# Patient Record
Sex: Female | Born: 1985 | Race: Black or African American | Hispanic: No | Marital: Single | Smoking: Current every day smoker
Health system: Southern US, Community
[De-identification: ages and names within clinical notes are randomized; demographics above are authoritative.]

## PROBLEM LIST (undated history)

## (undated) DIAGNOSIS — F419 Anxiety disorder, unspecified: Secondary | ICD-10-CM

## (undated) DIAGNOSIS — R55 Syncope and collapse: Secondary | ICD-10-CM

## (undated) HISTORY — DX: Syncope and collapse: R55

## (undated) HISTORY — DX: Anxiety disorder, unspecified: F41.9

---
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
## 2005-01-28 ENCOUNTER — Emergency Department (HOSPITAL_COMMUNITY): Admission: EM | Admit: 2005-01-28 | Discharge: 2005-01-28 | Payer: Self-pay | Admitting: Emergency Medicine

## 2005-05-08 ENCOUNTER — Emergency Department (HOSPITAL_COMMUNITY): Admission: EM | Admit: 2005-05-08 | Discharge: 2005-05-09 | Payer: Self-pay | Admitting: Emergency Medicine

## 2005-06-28 ENCOUNTER — Emergency Department (HOSPITAL_COMMUNITY): Admission: EM | Admit: 2005-06-28 | Discharge: 2005-06-28 | Payer: Self-pay | Admitting: Emergency Medicine

## 2005-08-24 ENCOUNTER — Emergency Department (HOSPITAL_COMMUNITY): Admission: EM | Admit: 2005-08-24 | Discharge: 2005-08-25 | Payer: Self-pay | Admitting: Emergency Medicine

## 2005-10-13 ENCOUNTER — Emergency Department (HOSPITAL_COMMUNITY): Admission: EM | Admit: 2005-10-13 | Discharge: 2005-10-13 | Payer: Self-pay | Admitting: Family Medicine

## 2005-11-13 ENCOUNTER — Emergency Department (HOSPITAL_COMMUNITY): Admission: EM | Admit: 2005-11-13 | Discharge: 2005-11-13 | Payer: Self-pay | Admitting: Family Medicine

## 2005-12-20 ENCOUNTER — Emergency Department (HOSPITAL_COMMUNITY): Admission: EM | Admit: 2005-12-20 | Discharge: 2005-12-20 | Payer: Self-pay | Admitting: Emergency Medicine

## 2006-03-28 ENCOUNTER — Emergency Department (HOSPITAL_COMMUNITY): Admission: EM | Admit: 2006-03-28 | Discharge: 2006-03-28 | Payer: Self-pay | Admitting: Family Medicine

## 2006-12-08 ENCOUNTER — Emergency Department (HOSPITAL_COMMUNITY): Admission: EM | Admit: 2006-12-08 | Discharge: 2006-12-08 | Payer: Self-pay | Admitting: Emergency Medicine

## 2007-05-21 ENCOUNTER — Emergency Department (HOSPITAL_COMMUNITY): Admission: EM | Admit: 2007-05-21 | Discharge: 2007-05-21 | Payer: Self-pay | Admitting: Emergency Medicine

## 2007-07-01 ENCOUNTER — Emergency Department (HOSPITAL_COMMUNITY): Admission: EM | Admit: 2007-07-01 | Discharge: 2007-07-01 | Payer: Self-pay | Admitting: Family Medicine

## 2007-07-10 ENCOUNTER — Ambulatory Visit (HOSPITAL_COMMUNITY): Admission: RE | Admit: 2007-07-10 | Discharge: 2007-07-10 | Payer: Self-pay | Admitting: Obstetrics

## 2007-08-15 ENCOUNTER — Emergency Department (HOSPITAL_COMMUNITY): Admission: EM | Admit: 2007-08-15 | Discharge: 2007-08-16 | Payer: Self-pay | Admitting: Emergency Medicine

## 2007-09-03 ENCOUNTER — Ambulatory Visit (HOSPITAL_COMMUNITY): Admission: RE | Admit: 2007-09-03 | Discharge: 2007-09-03 | Payer: Self-pay | Admitting: Obstetrics

## 2007-11-03 ENCOUNTER — Inpatient Hospital Stay (HOSPITAL_COMMUNITY): Admission: AD | Admit: 2007-11-03 | Discharge: 2007-11-03 | Payer: Self-pay | Admitting: Obstetrics & Gynecology

## 2007-11-07 ENCOUNTER — Ambulatory Visit (HOSPITAL_COMMUNITY): Admission: RE | Admit: 2007-11-07 | Discharge: 2007-11-07 | Payer: Self-pay | Admitting: Obstetrics

## 2007-11-19 ENCOUNTER — Ambulatory Visit (HOSPITAL_COMMUNITY): Admission: RE | Admit: 2007-11-19 | Discharge: 2007-11-19 | Payer: Self-pay | Admitting: Obstetrics

## 2007-12-16 ENCOUNTER — Ambulatory Visit (HOSPITAL_COMMUNITY): Admission: RE | Admit: 2007-12-16 | Discharge: 2007-12-16 | Payer: Self-pay | Admitting: Obstetrics

## 2008-01-06 ENCOUNTER — Inpatient Hospital Stay (HOSPITAL_COMMUNITY): Admission: AD | Admit: 2008-01-06 | Discharge: 2008-01-06 | Payer: Self-pay | Admitting: Obstetrics

## 2008-01-14 ENCOUNTER — Inpatient Hospital Stay (HOSPITAL_COMMUNITY): Admission: AD | Admit: 2008-01-14 | Discharge: 2008-01-14 | Payer: Self-pay | Admitting: Obstetrics

## 2008-01-19 ENCOUNTER — Inpatient Hospital Stay (HOSPITAL_COMMUNITY): Admission: AD | Admit: 2008-01-19 | Discharge: 2008-01-19 | Payer: Self-pay | Admitting: Obstetrics

## 2008-01-22 ENCOUNTER — Inpatient Hospital Stay (HOSPITAL_COMMUNITY): Admission: AD | Admit: 2008-01-22 | Discharge: 2008-01-22 | Payer: Self-pay | Admitting: Obstetrics

## 2008-02-02 ENCOUNTER — Inpatient Hospital Stay (HOSPITAL_COMMUNITY): Admission: AD | Admit: 2008-02-02 | Discharge: 2008-02-02 | Payer: Self-pay | Admitting: Obstetrics

## 2008-02-02 ENCOUNTER — Inpatient Hospital Stay (HOSPITAL_COMMUNITY): Admission: AD | Admit: 2008-02-02 | Discharge: 2008-02-05 | Payer: Self-pay | Admitting: Obstetrics

## 2008-08-02 ENCOUNTER — Emergency Department (HOSPITAL_COMMUNITY): Admission: EM | Admit: 2008-08-02 | Discharge: 2008-08-02 | Payer: Self-pay | Admitting: Family Medicine

## 2010-01-01 ENCOUNTER — Emergency Department (HOSPITAL_COMMUNITY)
Admission: EM | Admit: 2010-01-01 | Discharge: 2010-01-01 | Payer: Self-pay | Source: Home / Self Care | Admitting: Family Medicine

## 2010-03-29 ENCOUNTER — Inpatient Hospital Stay (INDEPENDENT_AMBULATORY_CARE_PROVIDER_SITE_OTHER)
Admission: RE | Admit: 2010-03-29 | Discharge: 2010-03-29 | Disposition: A | Discharge status: Home / Self Care | Payer: Self-pay | Source: Ambulatory Visit | Attending: Family Medicine | Admitting: Family Medicine

## 2010-03-29 DIAGNOSIS — Z331 Pregnant state, incidental: Secondary | ICD-10-CM

## 2010-04-01 ENCOUNTER — Inpatient Hospital Stay (HOSPITAL_COMMUNITY)
Admission: AD | Admit: 2010-04-01 | Discharge: 2010-04-02 | Disposition: A | Discharge status: Home / Self Care | Payer: Self-pay | Source: Ambulatory Visit | Attending: Obstetrics & Gynecology | Admitting: Obstetrics & Gynecology

## 2010-04-01 DIAGNOSIS — O2390 Unspecified genitourinary tract infection in pregnancy, unspecified trimester: Secondary | ICD-10-CM | POA: Insufficient documentation

## 2010-04-01 DIAGNOSIS — A499 Bacterial infection, unspecified: Secondary | ICD-10-CM | POA: Insufficient documentation

## 2010-04-01 DIAGNOSIS — B9689 Other specified bacterial agents as the cause of diseases classified elsewhere: Secondary | ICD-10-CM | POA: Insufficient documentation

## 2010-04-01 DIAGNOSIS — A5901 Trichomonal vulvovaginitis: Secondary | ICD-10-CM | POA: Insufficient documentation

## 2010-04-01 DIAGNOSIS — O98819 Other maternal infectious and parasitic diseases complicating pregnancy, unspecified trimester: Secondary | ICD-10-CM | POA: Insufficient documentation

## 2010-04-01 DIAGNOSIS — O209 Hemorrhage in early pregnancy, unspecified: Secondary | ICD-10-CM | POA: Insufficient documentation

## 2010-04-01 DIAGNOSIS — N760 Acute vaginitis: Secondary | ICD-10-CM | POA: Insufficient documentation

## 2010-04-01 DIAGNOSIS — N390 Urinary tract infection, site not specified: Secondary | ICD-10-CM | POA: Insufficient documentation

## 2010-04-01 LAB — URINALYSIS, ROUTINE W REFLEX MICROSCOPIC
Protein, ur: NEGATIVE mg/dL
Urobilinogen, UA: 0.2 mg/dL (ref 0.0–1.0)

## 2010-04-01 LAB — CBC
HCT: 35.8 % — ABNORMAL LOW (ref 36.0–46.0)
Hemoglobin: 11.6 g/dL — ABNORMAL LOW (ref 12.0–15.0)
MCHC: 32.4 g/dL (ref 30.0–36.0)
RBC: 3.8 MIL/uL — ABNORMAL LOW (ref 3.87–5.11)

## 2010-04-01 LAB — WET PREP, GENITAL

## 2010-04-01 LAB — ABO/RH: ABO/RH(D): O POS

## 2010-04-01 LAB — URINE MICROSCOPIC-ADD ON

## 2010-04-02 ENCOUNTER — Inpatient Hospital Stay (HOSPITAL_COMMUNITY): Payer: Self-pay

## 2010-04-02 LAB — HCG, QUANTITATIVE, PREGNANCY: hCG, Beta Chain, Quant, S: 299 m[IU]/mL — ABNORMAL HIGH (ref <5)

## 2010-04-05 ENCOUNTER — Encounter (HOSPITAL_COMMUNITY): Payer: Self-pay | Admitting: Radiology

## 2010-04-05 ENCOUNTER — Inpatient Hospital Stay (HOSPITAL_COMMUNITY)
Admission: AD | Admit: 2010-04-05 | Discharge: 2010-04-06 | Disposition: A | Discharge status: Home / Self Care | Payer: Self-pay | Source: Ambulatory Visit | Attending: Obstetrics and Gynecology | Admitting: Obstetrics and Gynecology

## 2010-04-05 ENCOUNTER — Inpatient Hospital Stay (HOSPITAL_COMMUNITY): Payer: Self-pay

## 2010-04-05 DIAGNOSIS — O209 Hemorrhage in early pregnancy, unspecified: Secondary | ICD-10-CM

## 2010-04-05 DIAGNOSIS — R52 Pain, unspecified: Secondary | ICD-10-CM

## 2010-04-05 LAB — AST: AST: 14 U/L (ref 0–37)

## 2010-04-05 LAB — CBC
Platelets: 336 10*3/uL (ref 150–400)
RBC: 3.94 MIL/uL (ref 3.87–5.11)
WBC: 8 10*3/uL (ref 4.0–10.5)

## 2010-04-05 LAB — DIFFERENTIAL
Basophils Absolute: 0 10*3/uL (ref 0.0–0.1)
Basophils Relative: 0 % (ref 0–1)
Eosinophils Absolute: 0.2 10*3/uL (ref 0.0–0.7)
Neutrophils Relative %: 54 % (ref 43–77)

## 2010-04-05 LAB — HCG, QUANTITATIVE, PREGNANCY: hCG, Beta Chain, Quant, S: 469 m[IU]/mL — ABNORMAL HIGH (ref <5)

## 2010-04-05 LAB — GC/CHLAMYDIA PROBE AMP, GENITAL
Chlamydia, DNA Probe: POSITIVE — AB
GC Probe Amp, Genital: NEGATIVE

## 2010-04-05 LAB — CREATININE, SERUM
Creatinine, Ser: 0.62 mg/dL (ref 0.4–1.2)
GFR calc Af Amer: >60 mL/min (ref >60)

## 2010-04-06 ENCOUNTER — Inpatient Hospital Stay (HOSPITAL_COMMUNITY): Admission: AD | Admit: 2010-04-06 | Payer: Self-pay | Source: Ambulatory Visit | Admitting: Obstetrics and Gynecology

## 2010-04-12 ENCOUNTER — Inpatient Hospital Stay (HOSPITAL_COMMUNITY)
Admission: AD | Admit: 2010-04-12 | Discharge: 2010-04-13 | Disposition: A | Discharge status: Home / Self Care | Payer: Self-pay | Source: Ambulatory Visit | Attending: Obstetrics & Gynecology | Admitting: Obstetrics & Gynecology

## 2010-04-12 DIAGNOSIS — O00109 Unspecified tubal pregnancy without intrauterine pregnancy: Secondary | ICD-10-CM | POA: Insufficient documentation

## 2010-04-12 LAB — DIFFERENTIAL
Eosinophils Absolute: 0.2 10*3/uL (ref 0.0–0.7)
Lymphs Abs: 2.3 10*3/uL (ref 0.7–4.0)
Monocytes Relative: 6 % (ref 3–12)
Neutrophils Relative %: 46 % (ref 43–77)

## 2010-04-12 LAB — CBC
MCH: 30.6 pg (ref 26.0–34.0)
MCV: 95.7 fL (ref 78.0–100.0)
Platelets: 381 10*3/uL (ref 150–400)
RBC: 3.76 MIL/uL — ABNORMAL LOW (ref 3.87–5.11)

## 2010-04-12 LAB — CREATININE, SERUM
Creatinine, Ser: 0.71 mg/dL (ref 0.4–1.2)
GFR calc non Af Amer: >60 mL/min (ref >60)

## 2010-05-29 LAB — POCT PREGNANCY, URINE: Preg Test, Ur: NEGATIVE

## 2010-05-29 LAB — POCT URINALYSIS DIP (DEVICE)
Bilirubin Urine: NEGATIVE
Glucose, UA: NEGATIVE mg/dL
Nitrite: NEGATIVE

## 2010-08-14 ENCOUNTER — Inpatient Hospital Stay (INDEPENDENT_AMBULATORY_CARE_PROVIDER_SITE_OTHER)
Admission: RE | Admit: 2010-08-14 | Discharge: 2010-08-14 | Disposition: A | Discharge status: Home / Self Care | Payer: Self-pay | Source: Ambulatory Visit | Attending: Emergency Medicine | Admitting: Emergency Medicine

## 2010-08-14 DIAGNOSIS — S91309A Unspecified open wound, unspecified foot, initial encounter: Secondary | ICD-10-CM

## 2010-11-14 LAB — POCT URINALYSIS DIP (DEVICE)
Nitrite: POSITIVE — AB
Operator id: 239701
Protein, ur: 30 — AB
Urobilinogen, UA: 0.2

## 2010-11-14 LAB — URINE CULTURE

## 2010-11-14 LAB — POCT PREGNANCY, URINE
Operator id: 239701
Preg Test, Ur: NEGATIVE

## 2010-11-16 LAB — COMPREHENSIVE METABOLIC PANEL
Alkaline Phosphatase: 51
BUN: 5 — ABNORMAL LOW
CO2: 23
Calcium: 9.2
GFR calc non Af Amer: >60
Glucose, Bld: 92
Total Protein: 6.5

## 2010-11-16 LAB — URINALYSIS, ROUTINE W REFLEX MICROSCOPIC
Glucose, UA: NEGATIVE
Ketones, ur: NEGATIVE
Nitrite: NEGATIVE
Protein, ur: NEGATIVE
Urobilinogen, UA: 0.2

## 2010-11-16 LAB — CBC
HCT: 35.4 — ABNORMAL LOW
Hemoglobin: 12.1
MCHC: 34.2
RBC: 3.77 — ABNORMAL LOW
RDW: 12.8

## 2010-11-16 LAB — DIFFERENTIAL
Basophils Relative: 1
Eosinophils Absolute: 0.2
Monocytes Relative: 6
Neutro Abs: 4.6
Neutrophils Relative %: 58

## 2010-11-16 LAB — LIPASE, BLOOD: Lipase: 12

## 2010-11-16 LAB — URINE MICROSCOPIC-ADD ON

## 2010-11-21 LAB — URINE MICROSCOPIC-ADD ON

## 2010-11-21 LAB — URINALYSIS, ROUTINE W REFLEX MICROSCOPIC
Glucose, UA: NEGATIVE
Hgb urine dipstick: NEGATIVE
Protein, ur: NEGATIVE
Specific Gravity, Urine: 1.015
pH: 7

## 2010-11-22 LAB — URINALYSIS, ROUTINE W REFLEX MICROSCOPIC
Bilirubin Urine: NEGATIVE
Glucose, UA: NEGATIVE
Ketones, ur: 15 — AB
Protein, ur: NEGATIVE
Urobilinogen, UA: 0.2

## 2010-11-22 LAB — URINE MICROSCOPIC-ADD ON

## 2010-11-24 LAB — CBC
HCT: 29.1 % — ABNORMAL LOW (ref 36.0–46.0)
HCT: 36.2 % (ref 36.0–46.0)
Hemoglobin: 10 g/dL — ABNORMAL LOW (ref 12.0–15.0)
MCHC: 34.4 g/dL (ref 30.0–36.0)
MCV: 95.3 fL (ref 78.0–100.0)
RBC: 3.05 MIL/uL — ABNORMAL LOW (ref 3.87–5.11)
RBC: 3.8 MIL/uL — ABNORMAL LOW (ref 3.87–5.11)
RDW: 15.2 % (ref 11.5–15.5)
WBC: 6.5 10*3/uL (ref 4.0–10.5)

## 2010-11-24 LAB — RPR: RPR Ser Ql: NONREACTIVE

## 2011-09-03 ENCOUNTER — Emergency Department: Payer: Self-pay | Admitting: *Deleted

## 2011-09-03 LAB — COMPREHENSIVE METABOLIC PANEL
Albumin: 3.8 g/dL (ref 3.4–5.0)
Alkaline Phosphatase: 67 U/L (ref 50–136)
BUN: 6 mg/dL — ABNORMAL LOW (ref 7–18)
Bilirubin,Total: 0.5 mg/dL (ref 0.2–1.0)
Co2: 25 mmol/L (ref 21–32)
Creatinine: 0.9 mg/dL (ref 0.60–1.30)
EGFR (African American): >60
EGFR (Non-African Amer.): >60
Glucose: 72 mg/dL (ref 65–99)
Osmolality: 277 (ref 275–301)
SGPT (ALT): 20 U/L
Sodium: 141 mmol/L (ref 136–145)
Total Protein: 7.8 g/dL (ref 6.4–8.2)

## 2011-09-03 LAB — URINALYSIS, COMPLETE
Glucose,UR: NEGATIVE mg/dL (ref 0–75)
Nitrite: NEGATIVE
Ph: 6 (ref 4.5–8.0)
Protein: NEGATIVE
Specific Gravity: 1.018 (ref 1.003–1.030)

## 2011-09-03 LAB — CBC
HCT: 39 % (ref 35.0–47.0)
HGB: 12.4 g/dL (ref 12.0–16.0)
MCHC: 31.9 g/dL — ABNORMAL LOW (ref 32.0–36.0)
Platelet: 327 10*3/uL (ref 150–440)
RBC: 3.97 10*6/uL (ref 3.80–5.20)
RDW: 13.8 % (ref 11.5–14.5)

## 2012-02-21 IMAGING — CR DG KNEE COMPLETE 4+V*R*
4 series · 4 of 4 positions shown · non-contrast
Comparison: None.

CLINICAL DATA: Right knee injury.

RIGHT KNEE - COMPLETE 4+ VIEW

[view not recorded (1 of 4)]
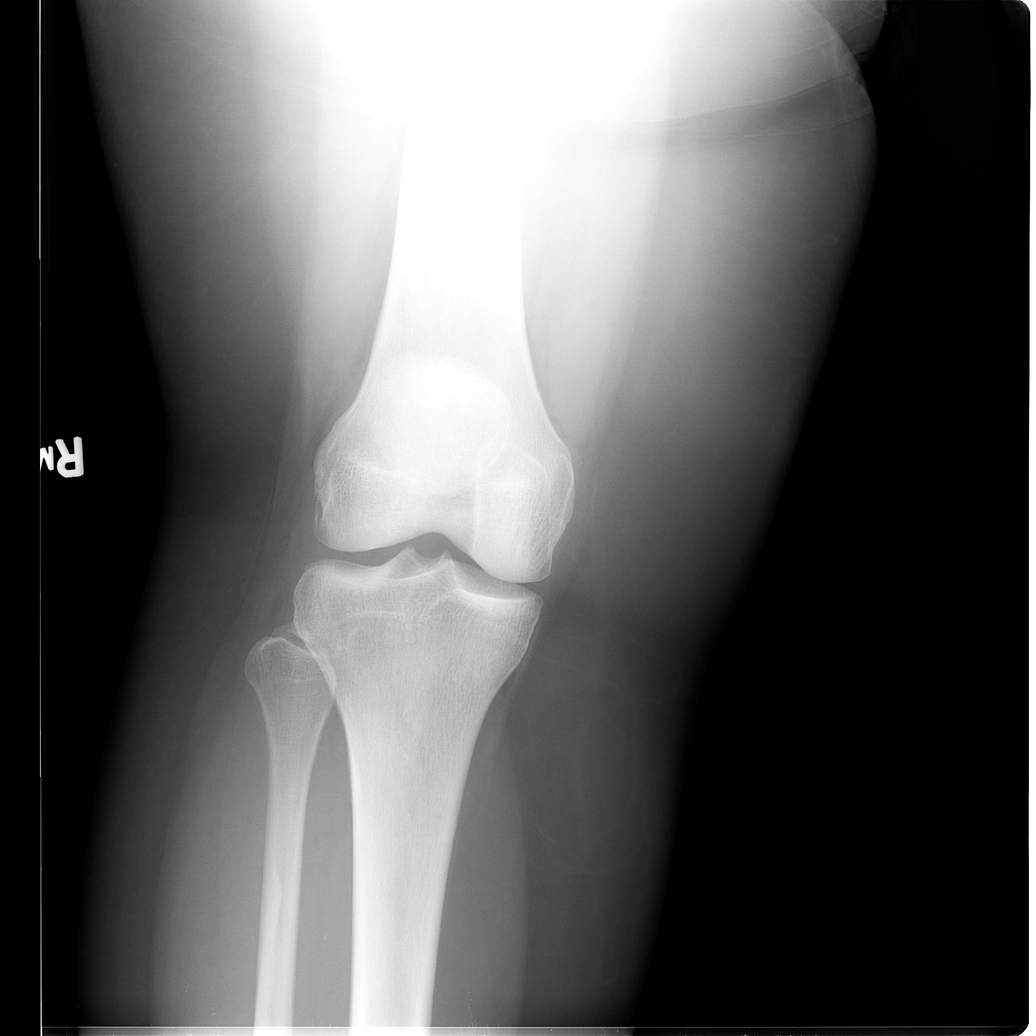

[view not recorded (2 of 4)]
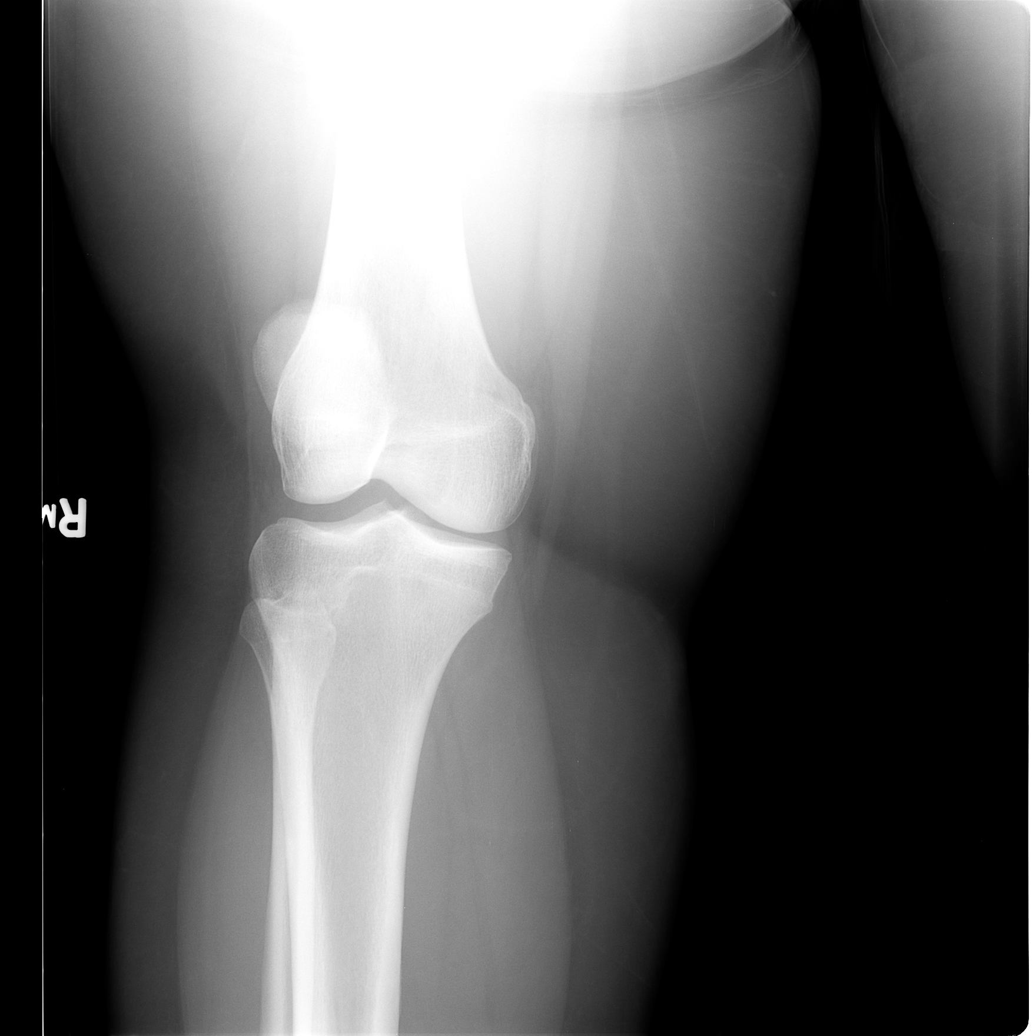

[view not recorded (3 of 4)]
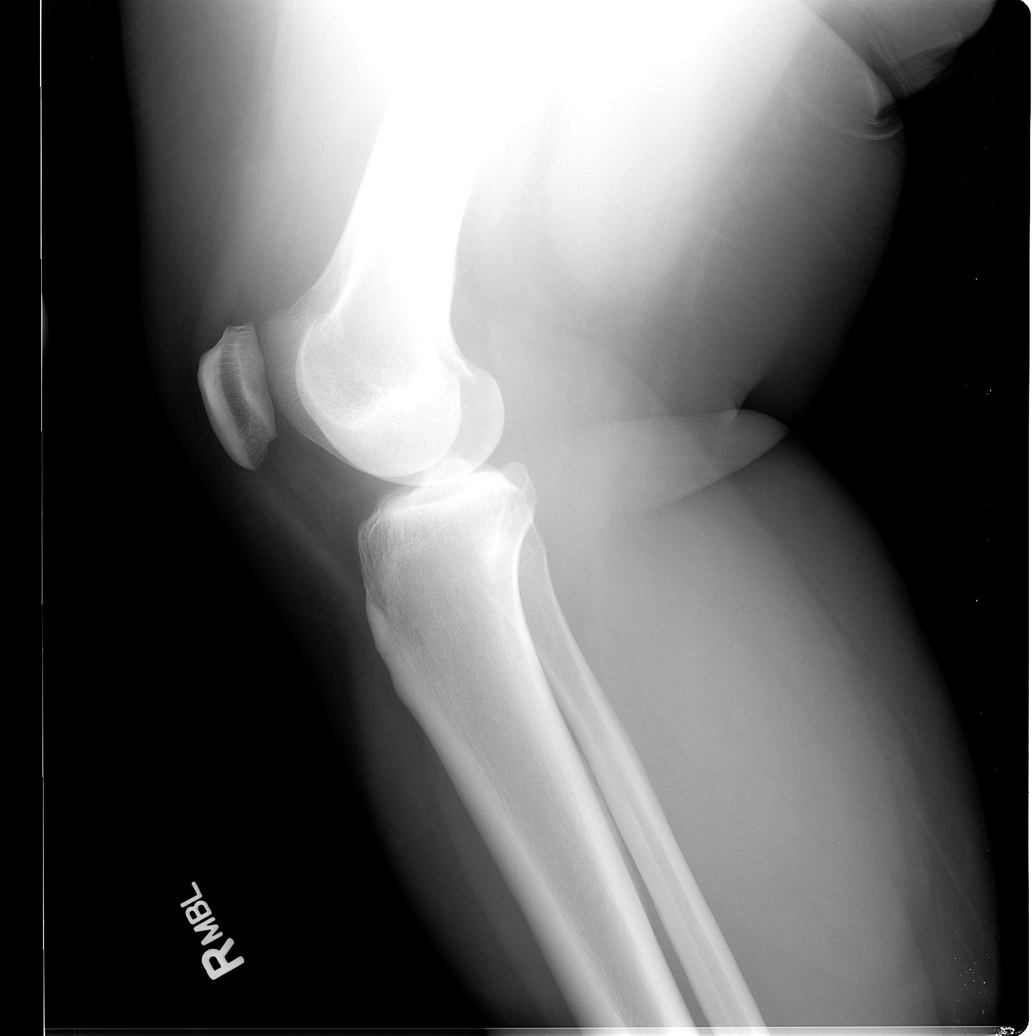

[view not recorded (4 of 4)]
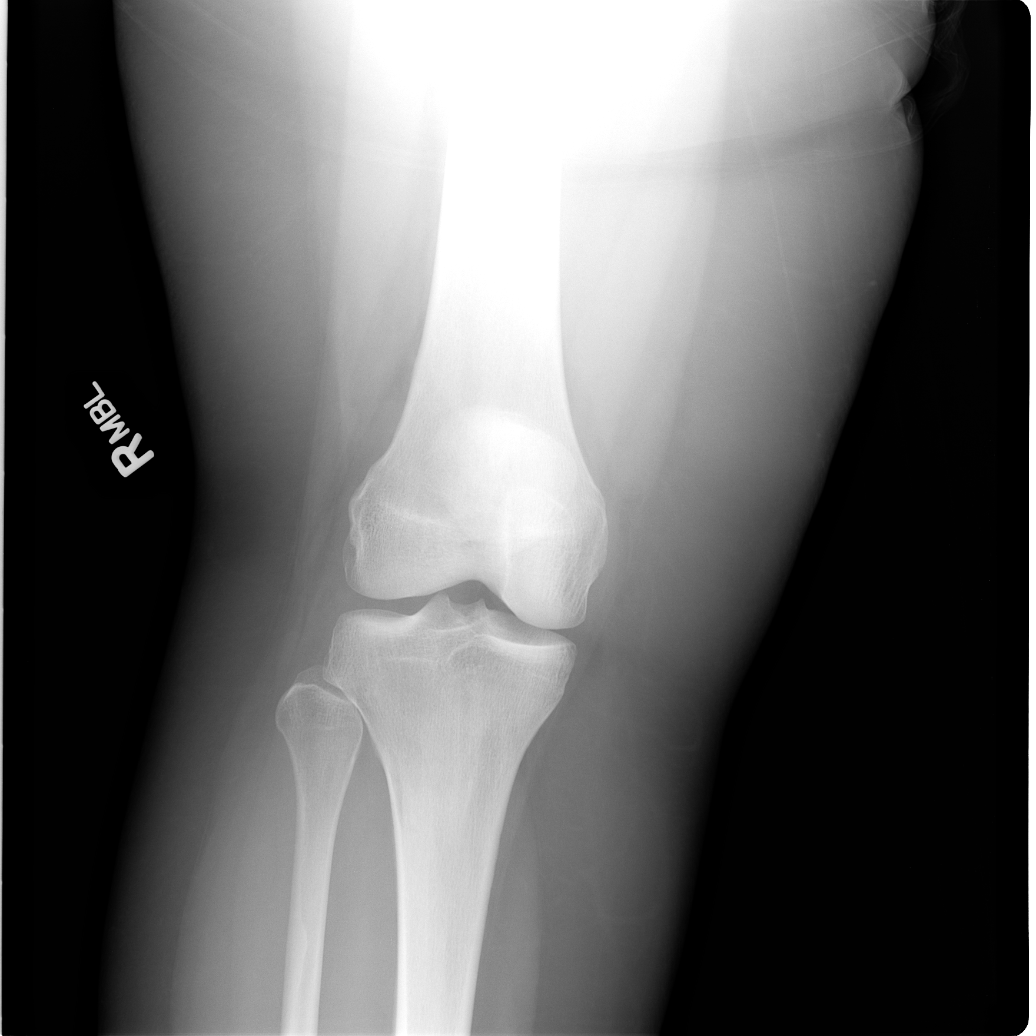

[4 of 4 positions shown; findings below may reference images not displayed]

FINDINGS: Four views of the right knee are negative for acute
fracture or dislocation.  No gross soft tissue abnormality.
IMPRESSION: Negative radiographs of the right knee.

## 2012-06-07 ENCOUNTER — Encounter (HOSPITAL_COMMUNITY): Payer: Self-pay | Admitting: *Deleted

## 2012-06-07 ENCOUNTER — Emergency Department (HOSPITAL_COMMUNITY)
Admission: EM | Admit: 2012-06-07 | Discharge: 2012-06-07 | Disposition: A | Discharge status: Home / Self Care | Payer: Self-pay | Attending: Emergency Medicine | Admitting: Emergency Medicine

## 2012-06-07 ENCOUNTER — Emergency Department (HOSPITAL_COMMUNITY): Payer: Self-pay

## 2012-06-07 DIAGNOSIS — Z3202 Encounter for pregnancy test, result negative: Secondary | ICD-10-CM | POA: Insufficient documentation

## 2012-06-07 DIAGNOSIS — W268XXA Contact with other sharp object(s), not elsewhere classified, initial encounter: Secondary | ICD-10-CM | POA: Insufficient documentation

## 2012-06-07 DIAGNOSIS — Y9389 Activity, other specified: Secondary | ICD-10-CM | POA: Insufficient documentation

## 2012-06-07 DIAGNOSIS — Y929 Unspecified place or not applicable: Secondary | ICD-10-CM | POA: Insufficient documentation

## 2012-06-07 DIAGNOSIS — R109 Unspecified abdominal pain: Secondary | ICD-10-CM

## 2012-06-07 DIAGNOSIS — M25579 Pain in unspecified ankle and joints of unspecified foot: Secondary | ICD-10-CM | POA: Insufficient documentation

## 2012-06-07 DIAGNOSIS — S3981XA Other specified injuries of abdomen, initial encounter: Secondary | ICD-10-CM | POA: Insufficient documentation

## 2012-06-07 DIAGNOSIS — IMO0002 Reserved for concepts with insufficient information to code with codable children: Secondary | ICD-10-CM | POA: Insufficient documentation

## 2012-06-07 DIAGNOSIS — S298XXA Other specified injuries of thorax, initial encounter: Secondary | ICD-10-CM | POA: Insufficient documentation

## 2012-06-07 DIAGNOSIS — F17200 Nicotine dependence, unspecified, uncomplicated: Secondary | ICD-10-CM | POA: Insufficient documentation

## 2012-06-07 DIAGNOSIS — T148XXA Other injury of unspecified body region, initial encounter: Secondary | ICD-10-CM

## 2012-06-07 LAB — POCT I-STAT, CHEM 8
BUN: 12 mg/dL (ref 6–23)
Creatinine, Ser: 0.8 mg/dL (ref 0.50–1.10)
Glucose, Bld: 86 mg/dL (ref 70–99)
Hemoglobin: 12.6 g/dL (ref 12.0–15.0)
Potassium: 3.9 mEq/L (ref 3.5–5.1)
Sodium: 142 mEq/L (ref 135–145)

## 2012-06-07 MED ORDER — IOHEXOL 300 MG/ML  SOLN
100.0000 mL | Freq: Once | INTRAMUSCULAR | Status: AC | PRN
  Administered 2012-06-07: 100 mL via INTRAVENOUS

## 2012-06-07 MED ORDER — METHOCARBAMOL 500 MG PO TABS: 1000.0000 mg | ORAL_TABLET | Freq: Four times a day (QID) | ORAL | Status: DC

## 2012-06-07 MED ORDER — TRAMADOL HCL 50 MG PO TABS: 50.0000 mg | ORAL_TABLET | Freq: Four times a day (QID) | ORAL | Status: DC | PRN
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
## 2012-06-12 NOTE — ED Provider Notes (Signed)
Medical screening examination/treatment/procedure(s) were performed by non-physician practitioner and as supervising physician I was immediately available for consultation/collaboration.  Hurman Horn, MD 06/12/12 [phone]

## 2013-02-26 ENCOUNTER — Emergency Department (INDEPENDENT_AMBULATORY_CARE_PROVIDER_SITE_OTHER)
Admission: EM | Admit: 2013-02-26 | Discharge: 2013-02-26 | Disposition: A | Discharge status: Home / Self Care | Payer: Self-pay | Source: Home / Self Care | Attending: Family Medicine | Admitting: Family Medicine

## 2013-02-26 ENCOUNTER — Encounter (HOSPITAL_COMMUNITY): Payer: Self-pay | Admitting: Emergency Medicine

## 2013-02-26 DIAGNOSIS — K5289 Other specified noninfective gastroenteritis and colitis: Secondary | ICD-10-CM

## 2013-02-26 DIAGNOSIS — K2960 Other gastritis without bleeding: Secondary | ICD-10-CM

## 2013-02-26 DIAGNOSIS — K529 Noninfective gastroenteritis and colitis, unspecified: Secondary | ICD-10-CM

## 2013-02-26 LAB — POCT URINALYSIS DIP (DEVICE)
Bilirubin Urine: NEGATIVE
GLUCOSE, UA: NEGATIVE mg/dL
Ketones, ur: NEGATIVE mg/dL
NITRITE: NEGATIVE
PH: 7.5 (ref 5.0–8.0)
Protein, ur: NEGATIVE mg/dL
SPECIFIC GRAVITY, URINE: 1.02 (ref 1.005–1.030)
Urobilinogen, UA: 1 mg/dL (ref 0.0–1.0)

## 2013-02-26 LAB — POCT PREGNANCY, URINE: Preg Test, Ur: NEGATIVE

## 2013-02-26 MED ORDER — ONDANSETRON HCL 4 MG PO TABS: 4.0000 mg | ORAL_TABLET | Freq: Four times a day (QID) | ORAL | Status: DC

## 2013-02-26 MED ORDER — RANITIDINE HCL 150 MG PO TABS: 150.0000 mg | ORAL_TABLET | Freq: Two times a day (BID) | ORAL | Status: DC

## 2013-02-26 MED ORDER — ONDANSETRON 4 MG PO TBDP: 8.0000 mg | ORAL_TABLET | Freq: Once | ORAL | Status: DC

## 2013-02-26 MED ORDER — GI COCKTAIL ~~LOC~~
30.0000 mL | Freq: Once | ORAL | Status: AC
  Administered 2013-02-26: 30 mL via ORAL

## 2013-02-26 MED ORDER — ONDANSETRON 4 MG PO TBDP
8.0000 mg | ORAL_TABLET | Freq: Once | ORAL | Status: AC
  Administered 2013-02-26: 8 mg via ORAL

## 2013-02-26 MED ORDER — GI COCKTAIL ~~LOC~~
ORAL | Status: AC
  Filled 2013-02-26: qty 30

## 2013-02-26 NOTE — ED Notes (Signed)
Seen by physician prior to this nurse

## 2013-02-26 NOTE — ED Notes (Signed)
Unable to discharge secondary to medication issue

## 2013-02-26 NOTE — ED Provider Notes (Signed)
CSN: [id number]     Arrival date & time 02/26/13  1619 History   First MD Initiated Contact with Patient 02/26/13 1822     No chief complaint on file.  (Consider location/radiation/quality/duration/timing/severity/associated sxs/prior Treatment) Patient is a 28 y.o. female presenting with vomiting. The history is provided by the patient.  Emesis Severity:  Mild Duration:  3 days Timing:  Intermittent Quality:  Stomach contents Progression:  Unchanged Chronicity:  New Ineffective treatments: ibuprofen. and heat packs on abd. Associated symptoms: abdominal pain and diarrhea     No past medical history on file. No past surgical history on file. No family history on file. History  Substance Use Topics  . Smoking status: Current Every Day Smoker    Types: Cigarettes  . Smokeless tobacco: Not on file  . Alcohol Use: No   OB History   Grav Para Term Preterm Abortions TAB SAB Ect Mult Living   1              Review of Systems  Constitutional: Negative.   Gastrointestinal: Positive for nausea, vomiting, abdominal pain and diarrhea. Negative for constipation and blood in stool.  Genitourinary: Negative.     Allergies  Aspirin and Penicillins  Home Medications   Current Outpatient Rx  Name  Route  Sig  Dispense  Refill  . methocarbamol (ROBAXIN) 500 MG tablet   Oral   Take 2 tablets (1,000 mg total) by mouth 4 (four) times daily.   20 tablet   0   . ondansetron (ZOFRAN) 4 MG tablet   Oral   Take 1 tablet (4 mg total) by mouth every 6 (six) hours. Prn n/v   8 tablet   0   . ranitidine (ZANTAC) 150 MG tablet   Oral   Take 1 tablet (150 mg total) by mouth 2 (two) times daily.   60 tablet   0   . traMADol (ULTRAM) 50 MG tablet   Oral   Take 1 tablet (50 mg total) by mouth every 6 (six) hours as needed for pain.   15 tablet   0    BP 102/68  Pulse 70  Temp(Src) 98.9 F (37.2 C) (Oral)  Resp 18  SpO2 100% Physical Exam  Nursing note and vitals  reviewed. Constitutional: She is oriented to person, place, and time. She appears well-developed and well-nourished.  Abdominal: Soft. Normal appearance and bowel sounds are normal. She exhibits no distension and no mass. There is tenderness in the epigastric area. There is no rigidity, no rebound, no guarding, no CVA tenderness, no tenderness at McBurney's point and negative Murphy's sign.  Neurological: She is alert and oriented to person, place, and time.  Skin: Skin is warm and dry.    ED Course  Procedures (including critical care time) Labs Review Labs Reviewed  POCT URINALYSIS DIP (DEVICE) - Abnormal; Notable for the following:    Hgb urine dipstick SMALL (*)    Leukocytes, UA SMALL (*)    All other components within normal limits   Imaging Review No results found.  EKG Interpretation    Date/Time:    Ventricular Rate:    PR Interval:    QRS Duration:   QT Interval:    QTC Calculation:   R Axis:     Text Interpretation:              MDM  Also took 10 ibuprofen today .    Linna HoffJames D Alphonzo Devera, MD 02/26/13 [phone]

## 2013-02-26 NOTE — Discharge Instructions (Signed)
Clear liquid , bland diet tonight as tolerated, advance on fri as improved, use medicine as needed,no more ibuprofen, return or see your doctor if any problems.

## 2013-02-26 NOTE — ED Notes (Signed)
zofran not in pyxis--notified pharmacy-staff member going to pharmacy to get zofran

## 2013-05-21 ENCOUNTER — Encounter (HOSPITAL_COMMUNITY): Payer: Self-pay | Admitting: Emergency Medicine

## 2013-05-21 ENCOUNTER — Emergency Department (INDEPENDENT_AMBULATORY_CARE_PROVIDER_SITE_OTHER)
Admission: EM | Admit: 2013-05-21 | Discharge: 2013-05-21 | Disposition: A | Discharge status: Home / Self Care | Payer: Medicaid Other | Source: Home / Self Care

## 2013-05-21 DIAGNOSIS — L509 Urticaria, unspecified: Secondary | ICD-10-CM

## 2013-05-21 LAB — POCT PREGNANCY, URINE: PREG TEST UR: NEGATIVE

## 2013-05-21 NOTE — ED Provider Notes (Signed)
Medical screening examination/treatment/procedure(s) were performed by non-physician practitioner and as supervising physician I was immediately available for consultation/collaboration.  Leslee Homeavid Aijalon Kirtz, M.D.  Reuben Likesavid C Aniruddh Ciavarella, MD 05/21/13 [phone]

## 2013-05-21 NOTE — ED Provider Notes (Signed)
CSN: [id number]     Arrival date & time 05/21/13  0815 History   First MD Initiated Contact with Patient 05/21/13 0830     Chief Complaint  Patient presents with  . Rash   (Consider location/radiation/quality/duration/timing/severity/associated sxs/prior Treatment) Patient is a 28 y.o. female presenting with rash. The history is provided by the patient. No language interpreter was used.  Rash Location:  Full body Quality: itchiness   Severity:  Moderate Onset quality:  Sudden Timing:  Constant Progression:  Worsening Chronicity:  New Relieved by:  Nothing Worsened by:  Nothing tried Ineffective treatments:  None tried Pt   History reviewed. No pertinent past medical history. History reviewed. No pertinent past surgical history. History reviewed. No pertinent family history. History  Substance Use Topics  . Smoking status: Current Every Day Smoker    Types: Cigarettes  . Smokeless tobacco: Not on file  . Alcohol Use: No   OB History   Grav Para Term Preterm Abortions TAB SAB Ect Mult Living   1              Review of Systems  Skin: Positive for rash.  All other systems reviewed and are negative.    Allergies  Aspirin and Penicillins  Home Medications   Current Outpatient Rx  Name  Route  Sig  Dispense  Refill  . methocarbamol (ROBAXIN) 500 MG tablet   Oral   Take 2 tablets (1,000 mg total) by mouth 4 (four) times daily.   20 tablet   0   . ondansetron (ZOFRAN) 4 MG tablet   Oral   Take 1 tablet (4 mg total) by mouth every 6 (six) hours. Prn n/v   8 tablet   0   . ranitidine (ZANTAC) 150 MG tablet   Oral   Take 1 tablet (150 mg total) by mouth 2 (two) times daily.   60 tablet   0   . traMADol (ULTRAM) 50 MG tablet   Oral   Take 1 tablet (50 mg total) by mouth every 6 (six) hours as needed for pain.   15 tablet   0    LMP 04/23/2013 Physical Exam  Nursing note and vitals reviewed. Constitutional: She is oriented to person, place, and time. She  appears well-developed and well-nourished.  HENT:  Head: Normocephalic.  Eyes: EOM are normal. Pupils are equal, round, and reactive to light.  Neck: Normal range of motion.  Cardiovascular: Normal rate and normal heart sounds.   Pulmonary/Chest: Effort normal.  Abdominal: She exhibits no distension.  Musculoskeletal: Normal range of motion.  Neurological: She is alert and oriented to person, place, and time.  Skin: Skin is warm.  Psychiatric: She has a normal mood and affect.    ED Course  Procedures (including critical care time) Labs Review Labs Reviewed  POCT PREGNANCY, URINE   Imaging Review No results found.   MDM   1. Hives    Pt reports last time she was pregnant she had hives.  No current hives,   They happen only at night.   I advised benadryl   Elson AreasLeslie K Sofia, PA-C 05/21/13 [phone]

## 2013-05-21 NOTE — Discharge Instructions (Signed)

## 2013-05-21 NOTE — ED Notes (Signed)
Pt  Reports  An itchy rash for  sev  Weeks          denys  Any new      meds      She displays  No  Angioedema               She  Is  Awake  And  Alert    In no  Acute  Distress               She  Is  Speaking in  Complete  sentances

## 2013-11-18 ENCOUNTER — Encounter (HOSPITAL_COMMUNITY): Payer: Self-pay | Admitting: Emergency Medicine

## 2013-11-18 ENCOUNTER — Emergency Department (HOSPITAL_COMMUNITY)
Admission: EM | Admit: 2013-11-18 | Discharge: 2013-11-19 | Discharge status: Against Medical Advice | Payer: Medicaid Other | Attending: Emergency Medicine | Admitting: Emergency Medicine

## 2013-11-18 DIAGNOSIS — G43909 Migraine, unspecified, not intractable, without status migrainosus: Secondary | ICD-10-CM | POA: Insufficient documentation

## 2013-11-18 DIAGNOSIS — F17200 Nicotine dependence, unspecified, uncomplicated: Secondary | ICD-10-CM | POA: Diagnosis not present

## 2013-11-18 DIAGNOSIS — F17210 Nicotine dependence, cigarettes, uncomplicated: Secondary | ICD-10-CM | POA: Insufficient documentation

## 2013-11-18 MED ORDER — ONDANSETRON HCL 4 MG PO TABS
8.0000 mg | ORAL_TABLET | Freq: Once | ORAL | Status: AC
  Administered 2013-11-18: 8 mg via ORAL
  Filled 2013-11-18: qty 2

## 2013-11-18 MED ORDER — OXYCODONE-ACETAMINOPHEN 5-325 MG PO TABS
1.0000 | ORAL_TABLET | Freq: Once | ORAL | Status: AC
  Administered 2013-11-18: 1 via ORAL
  Filled 2013-11-18: qty 1

## 2013-11-18 NOTE — ED Notes (Signed)
Pt has left AMA.  Pt advised she would come back tomorrow.

## 2013-11-18 NOTE — ED Notes (Signed)
Per pt sts migraine x 4 days. sts vision changes, N,V.

## 2013-12-21 ENCOUNTER — Encounter (HOSPITAL_COMMUNITY): Payer: Self-pay | Admitting: Emergency Medicine

## 2014-06-30 ENCOUNTER — Emergency Department (INDEPENDENT_AMBULATORY_CARE_PROVIDER_SITE_OTHER)
Admission: EM | Admit: 2014-06-30 | Discharge: 2014-06-30 | Disposition: A | Discharge status: Nursing Home (Includes ICF) | Payer: Medicaid Other | Source: Home / Self Care | Attending: Family Medicine | Admitting: Family Medicine

## 2014-06-30 ENCOUNTER — Emergency Department (HOSPITAL_COMMUNITY)
Admission: EM | Admit: 2014-06-30 | Discharge: 2014-07-01 | Disposition: A | Discharge status: Home / Self Care | Payer: Medicaid Other | Attending: Emergency Medicine | Admitting: Emergency Medicine

## 2014-06-30 ENCOUNTER — Emergency Department (HOSPITAL_COMMUNITY): Payer: Medicaid Other

## 2014-06-30 ENCOUNTER — Encounter (HOSPITAL_COMMUNITY): Payer: Self-pay | Admitting: Emergency Medicine

## 2014-06-30 ENCOUNTER — Encounter (HOSPITAL_COMMUNITY): Payer: Self-pay | Admitting: *Deleted

## 2014-06-30 DIAGNOSIS — R42 Dizziness and giddiness: Secondary | ICD-10-CM | POA: Insufficient documentation

## 2014-06-30 DIAGNOSIS — M25572 Pain in left ankle and joints of left foot: Secondary | ICD-10-CM | POA: Insufficient documentation

## 2014-06-30 DIAGNOSIS — Z720 Tobacco use: Secondary | ICD-10-CM | POA: Insufficient documentation

## 2014-06-30 DIAGNOSIS — R51 Headache: Secondary | ICD-10-CM | POA: Insufficient documentation

## 2014-06-30 DIAGNOSIS — R270 Ataxia, unspecified: Secondary | ICD-10-CM

## 2014-06-30 DIAGNOSIS — G8929 Other chronic pain: Secondary | ICD-10-CM

## 2014-06-30 DIAGNOSIS — Z880 Allergy status to penicillin: Secondary | ICD-10-CM | POA: Diagnosis not present

## 2014-06-30 DIAGNOSIS — R519 Headache, unspecified: Secondary | ICD-10-CM

## 2014-06-30 DIAGNOSIS — Z3202 Encounter for pregnancy test, result negative: Secondary | ICD-10-CM | POA: Diagnosis not present

## 2014-06-30 LAB — COMPREHENSIVE METABOLIC PANEL
ALT: 15 U/L (ref 14–54)
AST: 20 U/L (ref 15–41)
Albumin: 3.8 g/dL (ref 3.5–5.0)
Alkaline Phosphatase: 52 U/L (ref 38–126)
Anion gap: 9 (ref 5–15)
BUN: 12 mg/dL (ref 6–20)
CO2: 24 mmol/L (ref 22–32)
Calcium: 8.9 mg/dL (ref 8.9–10.3)
Chloride: 106 mmol/L (ref 101–111)
Creatinine, Ser: 0.82 mg/dL (ref 0.44–1.00)
GFR calc Af Amer: >60 mL/min (ref >60)
GFR calc non Af Amer: >60 mL/min (ref >60)
Glucose, Bld: 83 mg/dL (ref 70–99)
Potassium: 3.9 mmol/L (ref 3.5–5.1)
Sodium: 139 mmol/L (ref 135–145)
Total Bilirubin: 0.6 mg/dL (ref 0.3–1.2)
Total Protein: 7.5 g/dL (ref 6.5–8.1)

## 2014-06-30 LAB — URINE MICROSCOPIC-ADD ON

## 2014-06-30 LAB — CBC WITH DIFFERENTIAL/PLATELET
Basophils Absolute: 0 10*3/uL (ref 0.0–0.1)
Basophils Relative: 0 % (ref 0–1)
Eosinophils Absolute: 0.2 10*3/uL (ref 0.0–0.7)
Eosinophils Relative: 3 % (ref 0–5)
HCT: 35.8 % — ABNORMAL LOW (ref 36.0–46.0)
Hemoglobin: 12.1 g/dL (ref 12.0–15.0)
Lymphocytes Relative: 40 % (ref 12–46)
Lymphs Abs: 3.1 10*3/uL (ref 0.7–4.0)
MCH: 31.3 pg (ref 26.0–34.0)
MCHC: 33.8 g/dL (ref 30.0–36.0)
MCV: 92.5 fL (ref 78.0–100.0)
Monocytes Absolute: 0.5 10*3/uL (ref 0.1–1.0)
Monocytes Relative: 6 % (ref 3–12)
Neutro Abs: 4.1 10*3/uL (ref 1.7–7.7)
Neutrophils Relative %: 51 % (ref 43–77)
Platelets: 406 10*3/uL — ABNORMAL HIGH (ref 150–400)
RBC: 3.87 MIL/uL (ref 3.87–5.11)
RDW: 14.2 % (ref 11.5–15.5)
WBC: 7.9 10*3/uL (ref 4.0–10.5)

## 2014-06-30 LAB — URINALYSIS, ROUTINE W REFLEX MICROSCOPIC
Glucose, UA: NEGATIVE mg/dL
Ketones, ur: 40 mg/dL — AB
Leukocytes, UA: NEGATIVE
Nitrite: POSITIVE — AB
Protein, ur: 30 mg/dL — AB
SPECIFIC GRAVITY, URINE: 1.043 — AB (ref 1.005–1.030)
UROBILINOGEN UA: 0.2 mg/dL (ref 0.0–1.0)
pH: 5.5 (ref 5.0–8.0)

## 2014-06-30 LAB — POC URINE PREG, ED: PREG TEST UR: NEGATIVE

## 2014-06-30 MED ORDER — MECLIZINE HCL 25 MG PO TABS
25.0000 mg | ORAL_TABLET | Freq: Once | ORAL | Status: AC
  Administered 2014-06-30: 25 mg via ORAL
  Filled 2014-06-30: qty 1

## 2014-06-30 NOTE — ED Notes (Signed)
Pt currently on menses, given washcloths and pericare cleaner. In and out cath verbal order given for urine specimen.

## 2014-06-30 NOTE — ED Notes (Signed)
The pt has dizziness for 4 years worse fir 2 weeks.  She does not have a regular doctor.   She  Also has vision difficulties.  She fell today and has pain in her lt ankle.  lmp today she  Wants to be checked for diabetes

## 2014-06-30 NOTE — ED Notes (Signed)
Pt states that she has dealt with headaches and dizzy spells for the past 4 years. But in the last 2 weeks she has been falling because of the dizziness,

## 2014-06-30 NOTE — ED Provider Notes (Signed)
CSN: [id number]     Arrival date & time 06/30/14  2026 History   First MD Initiated Contact with Patient 06/30/14 2142     Chief Complaint  Patient presents with  . Dizziness     (Consider location/radiation/quality/duration/timing/severity/associated sxs/prior Treatment) HPI   29 year old female sent here from urgent care for evaluation of dizziness. Patient with recurrent dizziness for the past 4 years but worsened for the past 2-3 weeks. She reported persistent headache with associated vision changes, difficulty ambulating, dizziness, and light sensitivity. Described dizziness as room spinning sensation with scintillating scotoma and occasional diplopia. Headache is described as a frontal headache. Report having several separate episodes of falls , last fall was 2 weeks ago when her left leg gave out on her and she twisted her left ankle. She also endorsed sensation of flushing warmth throughout her body.  Sitting and closing her eyes does help with her dizziness.  She endorse L leg weakness for several weeks.  She has never been evaluated for her recurrent dizziness before. No hx of MS.  No prior stroke.  Denies fever, chills, neck stiffness, cp, sob, productive cough, abd pain, dysuria, abnormal bleeding or rash.  She is here at the urging of her mother to be evaluated for her sxs.  Report feeling dizzy earlier today but has since resolved. Currently on her menses.  Denies n/v/d. Pt does not have a PCP.    History reviewed. No pertinent past medical history. History reviewed. No pertinent past surgical history. No family history on file. History  Substance Use Topics  . Smoking status: Current Every Day Smoker    Types: Cigarettes  . Smokeless tobacco: Not on file  . Alcohol Use: No   OB History    Gravida Para Term Preterm AB TAB SAB Ectopic Multiple Living   1              Review of Systems  All other systems reviewed and are negative.     Allergies  Aspirin and  Penicillins  Home Medications   Prior to Admission medications   Not on File   BP 138/86 mmHg  Pulse 80  Temp(Src) 98.3 F (36.8 C) (Oral)  Resp 20  Ht  (1.702 m)  Wt 182 lb (82.555 kg)  BMI 28.50 kg/m2  SpO2 99%  LMP 06/30/2014 Physical Exam  Constitutional: She appears well-developed and well-nourished. No distress.  HENT:  Head: Atraumatic.  Right Ear: External ear normal.  Left Ear: External ear normal.  Eyes: Conjunctivae and EOM are normal. Pupils are equal, round, and reactive to light.  Neck: Normal range of motion. Neck supple.  No nuchal rigidity  Cardiovascular: Normal rate and regular rhythm.   Pulmonary/Chest: Effort normal and breath sounds normal.  Abdominal: Soft. Bowel sounds are normal. She exhibits no distension. There is no tenderness.  Musculoskeletal: She exhibits tenderness (L ankle: mild diffused tenderness with FROM no gross deformity and no bruising.  ).  Neurological: She is alert.  Neurologic exam:  Speech clear, pupils equal round reactive to light, extraocular movements intact  Normal peripheral visual fields Cranial nerves III through XII normal including no facial droop Follows commands, moves all extremities x4, normal strength to bilateral upper at all major muscle groups including grip.  Mildly decreased strength and sensation to LLE as compared to RLE.  Intact patella DTR Sensation normal to light touch  Coordination intact, no limb ataxia, finger-nose-finger normal Rapid alternating movements normal No pronator drift Gait normal  Skin: No rash noted.  Psychiatric: She has a normal mood and affect.  Nursing note and vitals reviewed.   ED Course  Procedures (including critical care time)  Patient here with recurrent episodes of dizziness and headache. This is acute on chronic in nature. Patient has never been evaluated for this before. His urine shows large hemoglobin and urine dipstick however patient is currently on her  menstrual period. She is nitrite positive showing many bacteria in her urine. She however denies any urinary symptoms therefore urine culture sent and no specific antibiotic given at this time. Her head CT scan shows no acute finding. At this time I recommend patient to follow-up with neurology outpatient for further evaluation to assess for other source that can cause of headache and dizziness such as multiple sclerosis. Patient does admits that she has family history of multiple sclerosis. She will be discharged with fioricet for headache, and meclizine for dizziness. Return precautions discussed.  Care discussed with Dr. Juleen ChinaKohut.  Labs Review Labs Reviewed  CBC WITH DIFFERENTIAL/PLATELET - Abnormal; Notable for the following:    HCT 35.8 (*)    Platelets 406 (*)    All other components within normal limits  URINALYSIS, ROUTINE W REFLEX MICROSCOPIC - Abnormal; Notable for the following:    APPearance CLOUDY (*)    Specific Gravity, Urine 1.043 (*)    Hgb urine dipstick LARGE (*)    Bilirubin Urine SMALL (*)    Ketones, ur 40 (*)    Protein, ur 30 (*)    Nitrite POSITIVE (*)    All other components within normal limits  URINE MICROSCOPIC-ADD ON - Abnormal; Notable for the following:    Bacteria, UA MANY (*)    All other components within normal limits  URINE CULTURE  COMPREHENSIVE METABOLIC PANEL  POC URINE PREG, ED    Imaging Review Dg Ankle Complete Left  06/30/2014   CLINICAL DATA:  Larey SeatFell today while experiencing lightheadedness. Now painful with weight-bearing.  EXAM: LEFT ANKLE COMPLETE - 3+ VIEW  COMPARISON:  None.  FINDINGS: There is no evidence of fracture, dislocation, or joint effusion. There is no evidence of arthropathy or other focal bone abnormality. Soft tissues are unremarkable.  IMPRESSION: Negative.   Electronically Signed   By: Ellery Plunkaniel R Mitchell M.D.   On: 06/30/2014 21:54   Ct Head Wo Contrast  07/01/2014   CLINICAL DATA:  Lightheadedness for several years, worse over  the past 2-3 weeks.  EXAM: CT HEAD WITHOUT CONTRAST  TECHNIQUE: Contiguous axial images were obtained from the base of the skull through the vertex without intravenous contrast.  COMPARISON:  None.  FINDINGS: There is no intracranial hemorrhage, mass or evidence of acute infarction. Gray matter and white matter appear normal. Brainstem and posterior fossa are unremarkable. The ventricles and basal cisterns appear normal.  The bony structures are intact. The visible portions of the paranasal sinuses are clear.  IMPRESSION: Normal brain   Electronically Signed   By: Ellery Plunkaniel R Mitchell M.D.   On: 07/01/2014 00:38     EKG Interpretation None      Date: 06/30/2014  Rate: 69  Rhythm: normal sinus rhythm  QRS Axis: normal  Intervals: normal  ST/T Wave abnormalities: normal  Conduction Disutrbances: none  Narrative Interpretation:   Old EKG Reviewed: No significant changes noted      MDM   Final diagnoses:  Dizzy  Chronic nonintractable headache, unspecified headache type    BP 110/66 mmHg  Pulse 66  Temp(Src) 98.3 F (36.8  C) (Oral)  Resp 25  Ht 5\' 7"  (1.702 m)  Wt 182 lb (82.555 kg)  BMI 28.50 kg/m2  SpO2 100%  LMP 06/30/2014  I have reviewed nursing notes and vital signs. I personally reviewed the imaging tests through PACS system  I reviewed available ER/hospitalization records thought the EMR     Fayrene HelperBowie Endora Teresi, PA-C 07/01/14 0049  Raeford RazorStephen Kohut, MD 07/01/14 1622

## 2014-06-30 NOTE — ED Notes (Signed)
Pt will be transferred to ED via shuttle for further evaluation

## 2014-06-30 NOTE — ED Provider Notes (Signed)
CSN: [id number]     Arrival date & time 06/30/14  1905 History   First MD Initiated Contact with Patient 06/30/14 1936     Chief Complaint  Patient presents with  . Dizziness  . Headache   (Consider location/radiation/quality/duration/timing/severity/associated sxs/prior Treatment) HPI Comments: Patient presents for evaluation of persistent headache with associated dizziness, vision changes, ataxia and photophobia. States headache began 3 weeks ago and has intensified over the past one week. Patient describes spells of dizziness that consist of the sensation that the room is spinning with associated diplopia, seeing "stars" described as yellow-grey spots in her fields of vision, and diaphoresis. She reports having several falls over past few days and this concerns her. Denies recent illness or injury.  Is unemployed Smokes but does not drink alcohol. No meds tried at home.   Patient is a 29 y.o. female presenting with dizziness and headaches. The history is provided by the patient.  Dizziness Associated symptoms: headaches and weakness   Associated symptoms: no hearing loss and no tinnitus   Headache Associated symptoms: dizziness, photophobia and weakness   Associated symptoms: no congestion, no drainage, no ear pain, no eye pain, no hearing loss, no seizures, no sinus pressure and no sore throat     History reviewed. No pertinent past medical history. History reviewed. No pertinent past surgical history. History reviewed. No pertinent family history. History  Substance Use Topics  . Smoking status: Current Every Day Smoker    Types: Cigarettes  . Smokeless tobacco: Not on file  . Alcohol Use: No   OB History    Gravida Para Term Preterm AB TAB SAB Ectopic Multiple Living   1              Review of Systems  Constitutional: Positive for diaphoresis.  HENT: Negative for congestion, ear discharge, ear pain, hearing loss, postnasal drip, rhinorrhea, sinus pressure, sore throat  and tinnitus.   Eyes: Positive for photophobia and visual disturbance. Negative for pain, discharge, redness and itching.  Respiratory: Negative.   Cardiovascular: Negative.   Gastrointestinal: Negative.   Endocrine: Negative for polydipsia, polyphagia and polyuria.  Genitourinary: Negative.   Musculoskeletal: Negative.   Skin: Negative.   Neurological: Positive for dizziness, weakness, light-headedness and headaches. Negative for seizures and syncope.  Psychiatric/Behavioral: Positive for sleep disturbance. Negative for hallucinations and confusion. The patient is not nervous/anxious.        States she has begun to take frequent naps over past one week    Allergies  Aspirin and Penicillins  Home Medications   Prior to Admission medications   Not on File   BP 118/85 mmHg  Pulse 75  Temp(Src) 98.7 F (37.1 C) (Oral)  Resp 20  SpO2 99%  LMP 06/30/2014 Physical Exam  Constitutional: She is oriented to person, place, and time. She appears well-developed and well-nourished. No distress.  +obese  HENT:  Head: Normocephalic and atraumatic.  Right Ear: Hearing and external ear normal.  Left Ear: Hearing and external ear normal.  Nose: Nose normal.  Mouth/Throat: Oropharynx is clear and moist.  Eyes: Conjunctivae and EOM are normal. Pupils are equal, round, and reactive to light.  Neck: Normal range of motion. Neck supple.  Cardiovascular: Normal rate, regular rhythm and normal heart sounds.   Pulmonary/Chest: Effort normal and breath sounds normal. No respiratory distress. She has no wheezes.  Musculoskeletal: Normal range of motion.  Neurological: She is alert and oriented to person, place, and time. She has normal strength. No cranial nerve  deficit or sensory deficit. Coordination and gait normal. GCS eye subscore is 4. GCS verbal subscore is 5. GCS motor subscore is 6.  Skin: Skin is warm and dry. No rash noted.  Psychiatric: She has a normal mood and affect. Her behavior is  normal.  Nursing note and vitals reviewed.   ED Course  Procedures (including critical care time) Labs Review Labs Reviewed - No data to display  Imaging Review No results found.   MDM   1. Dizziness   2. Ataxia   3. Chronic intractable headache, unspecified headache type   Patient without access to out patient primary care services. Escalating headache with associated dizziness and ataxia. Now resulting in frequent falls.  [facility]     Ria ClockJennifer Lee H Valyncia Wiens, [place] 06/30/14 2036

## 2014-06-30 NOTE — ED Notes (Signed)
Pt from home with mother for eval of intermittent dizziness x4 years accompanied by nausea, diaphoresis, and lightheadedness. Pt states today she had a near syncopal episode and was very unsteady on her feet which resulted in a fall. No obvious deformities noted. Pt currently on menstrual and denies any diarrhea. nad noted.

## 2014-07-01 ENCOUNTER — Encounter (HOSPITAL_COMMUNITY): Payer: Self-pay

## 2014-07-01 ENCOUNTER — Emergency Department (HOSPITAL_COMMUNITY): Payer: Medicaid Other

## 2014-07-01 MED ORDER — BUTALBITAL-APAP-CAFFEINE 50-325-40 MG PO TABS: 1.0000 | ORAL_TABLET | Freq: Four times a day (QID) | ORAL | Status: AC | PRN

## 2014-07-01 MED ORDER — MECLIZINE HCL 50 MG PO TABS: 50.0000 mg | ORAL_TABLET | Freq: Three times a day (TID) | ORAL | Status: DC | PRN

## 2014-07-01 NOTE — ED Notes (Signed)
PA at the bedside.

## 2014-07-01 NOTE — Discharge Instructions (Signed)
Please follow up with neurologist for further evaluation of your dizziness.  Use resource below to find a primary care provider for outpatient management of your health.  Return if your condition worsen or if you have other concerns.    Emergency Department Resource Guide 1) Find a Doctor and Pay Out of Pocket Although you won't have to find out who is covered by your insurance plan, it is a good idea to ask around and get recommendations. You will then need to call the office and see if the doctor you have chosen will accept you as a new patient and what types of options they offer for patients who are self-pay. Some doctors offer discounts or will set up payment plans for their patients who do not have insurance, but you will need to ask so you aren't surprised when you get to your appointment.  2) Contact Your Local Health Department Not all health departments have doctors that can see patients for sick visits, but many do, so it is worth a call to see if yours does. If you don't know where your local health department is, you can check in your phone book. The CDC also has a tool to help you locate your state's health department, and many state websites also have listings of all of their local health departments.  3) Find a Walk-in Clinic If your illness is not likely to be very severe or complicated, you may want to try a walk in clinic. These are popping up all over the country in pharmacies, drugstores, and shopping centers. They're usually staffed by nurse practitioners or physician assistants that have been trained to treat common illnesses and complaints. They're usually fairly quick and inexpensive. However, if you have serious medical issues or chronic medical problems, these are probably not your best option.  No Primary Care Doctor: - Call Health Connect at  [phone] - they can help you locate a primary care doctor that  accepts your insurance, provides certain services, etc. - Physician  Referral Service- [phone]  Chronic Pain Problems: Organization         Address  Phone   Notes  [facility]  [phone] Patients need to be referred by their primary care doctor.   Medication Assistance: Organization         Address  Phone   Notes  [facility] [address] [place], [place] [address] [phone] --Must be a resident of [facility] [place] -- Must have NO insurance coverage whatsoever (no Medicaid/ Medicare, etc.) -- The pt. MUST have a primary care doctor that directs their care regularly and follows them in the community   MedAssist  [phone]   [employer]  [phone]    Agencies that provide inexpensive medical care: Organization         Address  Phone   Notes  [facility]  [phone]   [facility]    [phone]   [facility] [address] [place], [place] [address] [phone]   [facility] [address], [place] [phone]   Planned Parenthood    [phone]   [facility]    [phone]   Community Health and [facility]  [address], [place] Phone:  [phone], Fax:  [phone] Hours of Operation:  9 am - 6 pm, M-F.  Also accepts Medicaid/Medicare and  self-pay.  [facility] for [place] [address], [address], [place] Phone: [phone], Fax: [phone]. Hours of Operation:  8:30 am - 5:30 pm, M-F.  Also accepts Medicaid and self-pay.  [facility] [address] [place] Phone: [phone]   [place], [place], [place] [phone], Ext. [phone] Mondays & Thursdays: 7-9 AM.  First 15 patients are seen on a first come, first serve basis.    [place] Providers:  Organization         Address  Phone   Notes  [facility] [address], [place] [phone] Also accepts self-pay patients.  [facility] [address], [place]  [phone]   [place], [address], [place] [phone]   [facility] [address] [place] [phone]   Lucianne Lei [address], [place]   [phone] Only accepts [place] Access [place] patients after they have their name applied to their card.   Self-Pay (no insurance) in [employer]:  Organization         Address  Phone   Notes  Sickle Cell Patients, [facility] [place] [phone]   [facility] [place] [phone]   [facility] [place]  [place], [place],  [phone]   [facility]/Dr. Osei-Bonsu  [address] [place] or [address], [place] [phone] Phone number for both [place] and [place] locations is the same.  Urgent Medical and [facility] [address] [place] [phone]   [facility] [address], [place] or [address] [phone]   [facility] [address] [place] [phone], phone; [phone], fax Sees patients 1st and 3rd Saturday of every month.  Must not qualify for public or private insurance (i.e. Medicaid, Medicare, [place] Health Choice, Veterans' Benefits)  Household income should be no more than 200% of the poverty level The clinic cannot treat you if you are pregnant or think you are pregnant  Sexually transmitted diseases are not treated at the clinic.    Dental Care: Organization         Address  Phone  Notes  [facility] Department of [facility] [phone] Accepts children up to age 40 who are enrolled  in [place] or [place]; pregnant women with a Medicaid card; and children who have applied for Medicaid or [place] Health Choice, but were declined, whose parents can pay a reduced fee at time of service.  [facility] Department of [facility]  [address], [place] [phone] Accepts children up to age 37 who are enrolled in [place] or [place]; pregnant women with a Medicaid card; and children who have applied for Medicaid or [place] Health Choice, but were declined, whose parents can pay a reduced fee at time of service.  [facility] [facility] Access PROGRAM  [place] [phone] Patients are seen by appointment only. Walk-ins are not accepted. [place] will see patients 57 years of age and older. Monday - Tuesday (8am-5pm) Most Wednesdays (8:30-5pm) $  30 per visit, cash only  [facility] Adult [employer] PROGRAM  [address], [facility] [phone] Patients are seen by appointment only. Walk-ins are not accepted. [place] will see patients 62 years of age and older. One Wednesday Evening (Monthly: Volunteer Based).  $30 per visit, cash only  Juana Di­az  [phone] for adults; Children under age 14, call Graduate Pediatric Dentistry at [phone]. Children aged 37-14, please call [phone] to request a pediatric application.  Dental services are provided in all areas of dental care including fillings, crowns and bridges, complete and partial dentures, implants, gum treatment, root canals, and extractions. Preventive care is also provided. Treatment is provided to both adults and children. Patients are selected via a lottery and there is often a waiting list.   [facility] [address] [place]  [phone] [email or web address]   [facility] [address] [place], [place] [phone], Ext. [phone] Second and Fourth Thursday of each month, opens at  6:30 AM; Clinic ends at 9 AM.  Patients are seen on a first-come first-served basis, and a limited number are seen during each clinic.   [facility]  [address] Hillard Danker [place], [place] [phone]   Eligibility Requirements You must have lived in [place], [place], or [place] counties for at least the last three months.   You cannot be eligible for state or federal sponsored [employer], including [employer], [place], or [employer].   You generally cannot be eligible for healthcare insurance through your employer.    How to apply: Eligibility screenings are held every Tuesday and Wednesday afternoon from 1:00 pm until 4:00 pm. You do not need an appointment for the interview!  [facility] [address] [place], [place]   [place]  [place] Department  [place]  [phone]    Behavioral Health Resources in the Community: Intensive Outpatient Programs Organization         Address  Phone  Notes  [facility] [place]. [address] [place], [place] [phone]   [facility]patient [address], [place], [place]   ADS: Alcohol & Drug Svcs [address] [place], [employer]   [place] [address]  [place], [place] or [phone]   Substance Abuse Resources Organization         Address  Phone  Notes  Alcohol and Drug Services  [phone]   [place]  [phone]   [facility] [place]   Chinita Pester  [phone]   Residential & Outpatient Substance Abuse Program  [phone]   Psychological Services Organization         Address  Phone  Notes  [facility]  [place]  [phone]   [place] [address] [place] or [phone]    Mobile Crisis Teams Organization         Address  Phone  Notes  [facility], Mobile Crisis Care Unit  [phone]   Assertive Psychotherapeutic Services  [address] [place], [place]   [place] Levels [address] [place] [phone]    Self-Help/Support Groups Organization         Address  Phone  Notes  [facility]ental Health Assoc[facility] of [place] - variety of support groups  [place] Call for more information  Narcotics Anonymous (NA), [facility]aring [facility] [address], [employer] [place]  2 meetings at this location   Special educational needs teacher         Address  Phone  Notes  ASAP Residential Treatment [place],    [place]  [phone]   [facility]  [address], [place] [phone], [place], [place]   [place], [place] [phone] Admissions: 8am-3pm M-F  Incentives Substance [place] [address],    [place], [place] [phone]   [facility] [address] [place], [place], [place]   [facility] [address],  [place], [place]   Insight Programs - Intensive Outpatient [address] Kristeen [address], [place], [place]   [facility] ([place] [address].,  [place], [place] [phone] or [phone]   [facility]idential Treatment Services (RTS) [address] [place], [place] Accepts Medicaid  [place] [address]  [place] [phone] Substance Abuse/Addiction Treatment   [facility] Organization         Address  Phone  Notes  [facility]  [phone]   Domenic Schwab, PhD [address] Arlis Porta [place], [place]   [phone] or [phone]   [place], [place] [phone]   [facility]  [address] [place], [place] [phone] Insurance/Medicaid/sponsorship through [facility] and Families [address], [address] [place]                                    [place], [place] [phone] [place] [address], [place] [phone]    Dr. Adele Schilder  [phone]   [facility] Dept. 1) [address], [place] 2) [place] 3)  [place] 65, [place] [phone]  [phone]   [place] [phone] or [phone] (After Hours)

## 2014-07-01 NOTE — ED Notes (Signed)
Patient is resting comfortably. 

## 2014-07-01 NOTE — ED Notes (Signed)
Patient is alert and orientedx4.  Patient was explained discharge instructions and they understood them with no questions.  The patient's mother, Rona/Faye is coming to take the patient home.

## 2014-07-03 LAB — URINE CULTURE: Special Requests: NORMAL

## 2014-07-04 NOTE — Progress Notes (Signed)
ED Antimicrobial Stewardship Positive Culture Follow Up   Kristin Wright is an 29 y.o. female who [facility] with a chief complaint of  Chief Complaint  Patient presents with  . Dizziness    Recent Results (from the past 720 hour(s))  Urine culture     Status: None   Collection Time: 06/30/14 10:14 AM  Result Value Ref Range Status   Specimen Description URINE, CATHETERIZED  Final   Special Requests Normal  Final   Colony Count   Final    >=100,000 COLONIES/ML Performed at [facility]    Culture   Final    ESCHERICHIA COLI Performed at [facility]    Report Status 07/03/2014 FINAL  Final   Organism ID, Bacteria ESCHERICHIA COLI  Final      Susceptibility   Escherichia coli - MIC*    AMPICILLIN <=2 SENSITIVE Sensitive     CEFAZOLIN <=4 SENSITIVE Sensitive     CEFTRIAXONE <=1 SENSITIVE Sensitive     CIPROFLOXACIN <=0.25 SENSITIVE Sensitive     GENTAMICIN 2 SENSITIVE Sensitive     LEVOFLOXACIN <=0.12 SENSITIVE Sensitive     NITROFURANTOIN <=16 SENSITIVE Sensitive     TOBRAMYCIN <=1 SENSITIVE Sensitive     TRIMETH/SULFA <=20 SENSITIVE Sensitive     PIP/TAZO <=4 SENSITIVE Sensitive     * ESCHERICHIA COLI    [x]  No treatment needed - asymptomatic bacteruria   ED Provider: Junius FinnerErin O'Malley, PA-C  Kristin Wright, Kristin Wright 07/04/2014, 2:56 PM Infectious Diseases Pharmacist Phone# [phone]

## 2014-07-05 ENCOUNTER — Telehealth: Payer: Self-pay | Admitting: *Deleted

## 2014-07-05 NOTE — ED Notes (Signed)
(+)  urine culture, no treatment necessary per Antoine PrimasE. Martin, Pharm

## 2014-07-18 ENCOUNTER — Emergency Department (INDEPENDENT_AMBULATORY_CARE_PROVIDER_SITE_OTHER)
Admission: EM | Admit: 2014-07-18 | Discharge: 2014-07-18 | Disposition: A | Discharge status: Home / Self Care | Payer: Medicaid Other | Source: Home / Self Care | Attending: Internal Medicine | Admitting: Internal Medicine

## 2014-07-18 ENCOUNTER — Encounter (HOSPITAL_COMMUNITY): Payer: Self-pay | Admitting: *Deleted

## 2014-07-18 DIAGNOSIS — J019 Acute sinusitis, unspecified: Secondary | ICD-10-CM | POA: Diagnosis not present

## 2014-07-18 DIAGNOSIS — N3000 Acute cystitis without hematuria: Secondary | ICD-10-CM

## 2014-07-18 LAB — POCT URINALYSIS DIP (DEVICE)
Bilirubin Urine: NEGATIVE
Glucose, UA: NEGATIVE mg/dL
KETONES UR: NEGATIVE mg/dL
NITRITE: POSITIVE — AB
PROTEIN: NEGATIVE mg/dL
Specific Gravity, Urine: 1.02 (ref 1.005–1.030)
Urobilinogen, UA: 0.2 mg/dL (ref 0.0–1.0)
pH: 6.5 (ref 5.0–8.0)

## 2014-07-18 LAB — POCT PREGNANCY, URINE: PREG TEST UR: NEGATIVE

## 2014-07-18 MED ORDER — CEFDINIR 300 MG PO CAPS: 300.0000 mg | ORAL_CAPSULE | Freq: Two times a day (BID) | ORAL | Status: DC

## 2014-07-18 MED ORDER — PREDNISONE 50 MG PO TABS: 50.0000 mg | ORAL_TABLET | Freq: Every day | ORAL | Status: AC

## 2014-07-18 NOTE — ED Notes (Signed)
Pt  Was  Seen   sev  Weeks  Ago  For  dizzyness     She   Had  An  Extensive   Workup      And   Was   Diagnosed  With      Vertigo            She  Reports  Symptoms  Of  dizzyness   Body  Aches        And  Sweats           pt  Also  Is  Coughing   And  States  She  Fell  X  2  Recently     Incidentally     Pt  Had  A  Urinary  Infection   And   Apparently  Has  Taken  No  Anti  Biotics

## 2014-07-18 NOTE — Discharge Instructions (Signed)
Prescriptions for cefdinir (antibiotic) and prednisone (steroid, for congestion).    Sinusitis Sinusitis is redness, soreness, and puffiness (inflammation) of the air pockets in the bones of your face (sinuses). The redness, soreness, and puffiness can cause air and mucus to get trapped in your sinuses. This can allow germs to grow and cause an infection.  HOME CARE   Drink enough fluids to keep your pee (urine) clear or pale yellow.  Use a humidifier in your home.  Run a hot shower to create steam in the bathroom. Sit in the bathroom with the door closed. Breathe in the steam 3-4 times a day.  Put a warm, moist washcloth on your face 3-4 times a day, or as told by your doctor.  Use salt water sprays (saline sprays) to wet the thick fluid in your nose. This can help the sinuses drain.  Only take medicine as told by your doctor. GET HELP RIGHT AWAY IF:   Your pain gets worse.  You have very bad headaches.  You are sick to your stomach (nauseous).  You throw up (vomit).  You are very sleepy (drowsy) all the time.  Your face is puffy (swollen).  Your vision changes.  You have a stiff neck.  You have trouble breathing. MAKE SURE YOU:   Understand these instructions.  Will watch your condition.  Will get help right away if you are not doing well or get worse. Document Released: 07/25/2007 Document Revised: 10/31/2011 Document Reviewed: 09/11/2011 [facility] Patient Information 2015 [place]. This information is not intended to replace advice given to you by your health care provider. Make sure you discuss any questions you have with your health care provider.

## 2014-07-18 NOTE — ED Provider Notes (Signed)
CSN: [id number]     Arrival date & time 07/18/14  [id number] History   First MD Initiated Contact with Patient 07/18/14 1053     Chief Complaint  Patient presents with  . Dizziness   HPI  Patient presents today with a flareup of dizziness, has had this on and off for 4 or 5 years. Has some trouble with falling during episodes of dizziness, but has not injured herself. She has had headache, severe nasal congestion, mouth breathing, purulent nasal drainage, productive cough for weeks. Her dizziness is rotatory, but worse with changes in position, such as sitting up from lying down, or standing up from sitting. No fever. Some difficulty with emesis after coughing in the morning, but able to take oral nutrition. No change in bowel movements. No dysuria, no urinary frequency. Last menstrual period May 5 or May 8. No unusual vaginal bleeding or discharge. Mild bilateral flank discomfort.  Denies recent antibiotic therapy.  History reviewed. No pertinent past medical history. History reviewed. No pertinent past surgical history. History reviewed. No pertinent family history. History  Substance Use Topics  . Smoking status: Current Every Day Smoker    Types: Cigarettes  . Smokeless tobacco: Not on file  . Alcohol Use: No   OB History    Gravida Para Term Preterm AB TAB SAB Ectopic Multiple Living   1              Review of Systems  All other systems reviewed and are negative.   Allergies  Aspirin and Penicillins  Home Medications   Prior to Admission medications   Medication Sig Start Date End Date Taking? Authorizing Provider  butalbital-acetaminophen-caffeine (FIORICET) [phone] MG per tablet Take 1-2 tablets by mouth every 6 (six) hours as needed for headache. 07/01/14 07/01/15  Fayrene HelperBowie Tran, PA-C  meclizine (ANTIVERT) 50 MG tablet Take 1 tablet (50 mg total) by mouth 3 (three) times daily as needed for dizziness. 07/01/14   Fayrene HelperBowie Tran, PA-C   BP 130/76 mmHg  Pulse 73  Temp(Src) 98.5 F  (36.9 C) (Oral)  Resp 18  SpO2 100%  LMP 06/27/2014 Physical Exam  Constitutional: She is oriented to person, place, and time. No distress.  Alert, nicely groomed  HENT:  Head: Atraumatic.  Bilateral TMs markedly dull, left TM is red tinged Marked nasal congestion, essentially occluded. Mucopurulent material on the left Throat is red.  Eyes:  Conjugate gaze, no eye redness/drainage  Neck: Neck supple.  Cardiovascular: Normal rate and regular rhythm.   Pulmonary/Chest: No respiratory distress. She has no wheezes. She has no rales.  Lungs clear, symmetric breath sounds  Abdominal: Soft. She exhibits no distension. There is no rebound and no guarding.  Mild discomfort to deep palpation bilateral flanks, uncertain significance  Musculoskeletal: Normal range of motion.  No leg swelling  Neurological: She is alert and oriented to person, place, and time.  Skin: Skin is warm and dry.  No cyanosis  Nursing note and vitals reviewed.   ED Course  Procedures (including critical care time) Labs Review Labs Reviewed  POCT URINALYSIS DIP (DEVICE) - Abnormal; Notable for the following:    Hgb urine dipstick SMALL (*)    Nitrite POSITIVE (*)    Leukocytes, UA SMALL (*)    All other components within normal limits  POCT PREGNANCY, URINE    Imaging Review No results found.   MDM   1. Acute sinusitis with symptoms > 10 days   2. Acute cystitis without hematuria  Prescription for Omnicef, prednisone, for sinusitis symptoms present for greater than 10 days with headache and dizziness. Should cover possible urinary tract infection, based on recent culture results, with symptoms of flank pain but not dysuria. Recheck if symptoms not improving in 10-14 days.    Eustace Moore, MD 07/18/14 [phone]

## 2016-08-17 ENCOUNTER — Ambulatory Visit (HOSPITAL_COMMUNITY)
Admission: EM | Admit: 2016-08-17 | Discharge: 2016-08-17 | Disposition: A | Discharge status: Home / Self Care | Payer: Medicaid Other | Attending: Internal Medicine | Admitting: Internal Medicine

## 2016-08-17 ENCOUNTER — Encounter (HOSPITAL_COMMUNITY): Payer: Self-pay | Admitting: Emergency Medicine

## 2016-08-17 DIAGNOSIS — Z3202 Encounter for pregnancy test, result negative: Secondary | ICD-10-CM | POA: Diagnosis not present

## 2016-08-17 DIAGNOSIS — A084 Viral intestinal infection, unspecified: Secondary | ICD-10-CM

## 2016-08-17 LAB — POCT PREGNANCY, URINE: Preg Test, Ur: NEGATIVE

## 2016-08-17 MED ORDER — ONDANSETRON 4 MG PO TBDP: 4.0000 mg | ORAL_TABLET | Freq: Three times a day (TID) | ORAL | 0 refills | Status: DC | PRN

## 2016-08-17 MED ORDER — ONDANSETRON 4 MG PO TBDP
8.0000 mg | ORAL_TABLET | Freq: Once | ORAL | Status: AC
  Administered 2016-08-17: 8 mg via ORAL

## 2016-08-17 MED ORDER — GI COCKTAIL ~~LOC~~
ORAL | Status: AC
  Filled 2016-08-17: qty 30

## 2016-08-17 MED ORDER — ONDANSETRON 4 MG PO TBDP
ORAL_TABLET | ORAL | Status: AC
  Filled 2016-08-17: qty 2

## 2016-08-17 MED ORDER — OMEPRAZOLE 40 MG PO CPDR: 40.0000 mg | DELAYED_RELEASE_CAPSULE | Freq: Two times a day (BID) | ORAL | 0 refills | Status: DC

## 2016-08-17 MED ORDER — DIPHENOXYLATE-ATROPINE 2.5-0.025 MG PO TABS: 1.0000 | ORAL_TABLET | Freq: Four times a day (QID) | ORAL | 0 refills | Status: DC | PRN

## 2016-08-17 MED ORDER — DICYCLOMINE HCL 20 MG PO TABS: 20.0000 mg | ORAL_TABLET | Freq: Two times a day (BID) | ORAL | 0 refills | Status: DC

## 2016-08-17 MED ORDER — GI COCKTAIL ~~LOC~~
30.0000 mL | Freq: Once | ORAL | Status: AC
  Administered 2016-08-17: 30 mL via ORAL

## 2016-08-17 NOTE — ED Provider Notes (Signed)
CSN: [id number]     Arrival date & time 08/17/16  1507 History   First MD Initiated Contact with Patient 08/17/16 1534     Chief Complaint  Patient presents with  . Emesis  . Diarrhea   (Consider location/radiation/quality/duration/timing/severity/associated sxs/prior Treatment) Kristin Wright is a 31 y.o. female who presents to the [facility] urgent care with a chief complaint of nausea, vomiting, diarrhea, and abdominal pain. States that last week her son came down with a "stomach bug" and that she thinks she caught what he had. She has vomited multiple times a day for the past several days, unable to keep food down, she has been drinking water, various fluids, the red Gatorade's, last menstrual period was 07/27/2016, she still has both her gallbladder, and her appendix, denies any other complaints such as weakness, dizziness, fever, etc.   The history is provided by the patient.  Emesis  Associated symptoms: abdominal pain and diarrhea   Risk factors: sick contacts   Diarrhea  Associated symptoms: abdominal pain and vomiting     History reviewed. No pertinent past medical history. History reviewed. No pertinent surgical history. No family history on file. Social History  Substance Use Topics  . Smoking status: Current Every Day Smoker    Types: Cigarettes  . Smokeless tobacco: Never Used  . Alcohol use No   OB History    Gravida Para Term Preterm AB Living   1             SAB TAB Ectopic Multiple Live Births                 Review of Systems  Constitutional: Negative.   HENT: Negative.   Respiratory: Negative.   Cardiovascular: Negative.   Gastrointestinal: Positive for abdominal pain, diarrhea, nausea and vomiting.  Genitourinary: Negative.   Musculoskeletal: Negative.   Skin: Negative.   Neurological: Negative.     Allergies  Aspirin and Penicillins  Home Medications   Prior to Admission medications   Medication Sig Start Date End Date Taking?  Authorizing Provider  cefdinir (OMNICEF) 300 MG capsule Take 1 capsule (300 mg total) by mouth 2 (two) times daily. 07/18/14   Eustace Moore, MD  dicyclomine (BENTYL) 20 MG tablet Take 1 tablet (20 mg total) by mouth 2 (two) times daily. 08/17/16   Dorena Bodo, NP  diphenoxylate-atropine (LOMOTIL) 2.5-0.025 MG tablet Take 1 tablet by mouth 4 (four) times daily as needed for diarrhea or loose stools. 08/17/16   Dorena Bodo, NP  meclizine (ANTIVERT) 50 MG tablet Take 1 tablet (50 mg total) by mouth 3 (three) times daily as needed for dizziness. 07/01/14   Fayrene Helper, PA-C  omeprazole (PRILOSEC) 40 MG capsule Take 1 capsule (40 mg total) by mouth 2 (two) times daily. 08/17/16 08/31/16  Dorena Bodo, NP  ondansetron (ZOFRAN ODT) 4 MG disintegrating tablet Take 1 tablet (4 mg total) by mouth every 8 (eight) hours as needed for nausea or vomiting. 08/17/16   Dorena Bodo, NP   Meds Ordered and Administered this Visit   Medications  gi cocktail (Maalox,Lidocaine,Donnatal) (30 mLs Oral Given 08/17/16 1605)  ondansetron (ZOFRAN-ODT) disintegrating tablet 8 mg (8 mg Oral Given 08/17/16 1605)    BP (!) 136/95 (BP Location: Right Arm)   Pulse (!) 104   Temp 98.8 F (37.1 C) (Oral)   Resp 16   Ht 5\' 8"  (1.727 m)   Wt 285 lb (129.3 kg)   LMP 07/27/2016   SpO2 98%  BMI 43.33 kg/m  No data found.   Physical Exam  Constitutional: She is oriented to person, place, and time. She appears well-developed and well-nourished. No distress.  HENT:  Head: Normocephalic.  Right Ear: External ear normal.  Left Ear: External ear normal.  Mouth/Throat: Oropharynx is clear and moist.  Eyes: Conjunctivae are normal.  Neck: Normal range of motion.  Cardiovascular: Normal rate and regular rhythm.   Pulmonary/Chest: Effort normal and breath sounds normal.  Abdominal: Soft. Normal appearance and bowel sounds are normal. There is tenderness in the epigastric area and periumbilical area. There is  no rebound, no CVA tenderness, no tenderness at McBurney's point and negative Murphy's sign.  Neurological: She is alert and oriented to person, place, and time.  Skin: Skin is warm and dry. Capillary refill takes less than 2 seconds. She is not diaphoretic.  Psychiatric: She has a normal mood and affect. Her behavior is normal.  Nursing note and vitals reviewed.   Urgent Care Course     Procedures (including critical care time)  Labs Review Labs Reviewed  POCT PREGNANCY, URINE    Imaging Review No results found.      MDM   1. Viral gastroenteritis     Kristin Wright is a 31 y.o. female who [facility] care with a chief complaint of nausea, vomiting, diarrhea, and abdominal pain. States that last week her son came down with a "stomach bug" and that she thinks she caught what he had. She has vomited multiple times a day for the past several days, unable to keep food down, she has been drinking water, various fluids, the red Gatorade's, last menstrual period was 07/27/2016, she still has both her gallbladder, and her appendix, denies any other complaints such as weakness, dizziness, fever, etc.  Differential for abdominal pain is broad, pregnancy test was negative. There is no guarding, masses felt, or rebound tenderness. Symptoms are consistent with a viral gastroenteritis, believe patient is safe for outpatient treatment. Started on Prilosec, Bentyl, Lomotil, and Zofran. Counseling on dietary guidelines in fluid intake given along with strict follow-up guidelines. Return to clinic as needed.      Dorena BodoKennard, Paton Crum, NP 08/17/16 1912

## 2016-08-17 NOTE — Discharge Instructions (Signed)
You most likely have viral gastritis. I prescribed medications to help your symptoms. For Nausea, I have prescribed Zofran, take 1 tablet under the tongue every 8 hours as needed. For abdominal pain and cramping I prescribed a medicine called Bentyl, take one tablet by mouth twice daily. And finally have prescribed a medicine called omeprazole, take 1 tablet by mouth twice daily. Should your symptoms persist, or fail to improve, follow up with your primary care provider or return to clinic as needed. If your symptoms worsen, particularly if you develop worsened abdominal pain, fever, signs or symptoms of severe dehydration, then go to the emergency room.

## 2016-08-17 NOTE — ED Triage Notes (Signed)
PT reports her son had a 24h stomach bug earlier this week. PT began having symptoms Wednesday. PT reports generalized abdominal pain, diarrhea, and vomiting. PT took phenergan PO this AM with no relief. PT denies fevers. PT also notes that she cannot hear out of left ear for 1 month

## 2017-03-01 ENCOUNTER — Other Ambulatory Visit: Payer: Self-pay

## 2017-03-01 ENCOUNTER — Encounter (HOSPITAL_COMMUNITY): Payer: Self-pay | Admitting: Emergency Medicine

## 2017-03-01 ENCOUNTER — Ambulatory Visit (HOSPITAL_COMMUNITY)
Admission: EM | Admit: 2017-03-01 | Discharge: 2017-03-01 | Disposition: A | Discharge status: Home / Self Care | Payer: Self-pay | Attending: Internal Medicine | Admitting: Internal Medicine

## 2017-03-01 ENCOUNTER — Ambulatory Visit (INDEPENDENT_AMBULATORY_CARE_PROVIDER_SITE_OTHER): Payer: Self-pay

## 2017-03-01 DIAGNOSIS — R05 Cough: Secondary | ICD-10-CM

## 2017-03-01 DIAGNOSIS — R1110 Vomiting, unspecified: Secondary | ICD-10-CM

## 2017-03-01 DIAGNOSIS — R062 Wheezing: Secondary | ICD-10-CM

## 2017-03-01 DIAGNOSIS — J40 Bronchitis, not specified as acute or chronic: Secondary | ICD-10-CM

## 2017-03-01 DIAGNOSIS — Z3202 Encounter for pregnancy test, result negative: Secondary | ICD-10-CM

## 2017-03-01 DIAGNOSIS — R1013 Epigastric pain: Secondary | ICD-10-CM

## 2017-03-01 DIAGNOSIS — B349 Viral infection, unspecified: Secondary | ICD-10-CM

## 2017-03-01 LAB — POCT PREGNANCY, URINE: Preg Test, Ur: NEGATIVE

## 2017-03-01 MED ORDER — ONDANSETRON 4 MG PO TBDP
ORAL_TABLET | ORAL | Status: AC
  Filled 2017-03-01: qty 1

## 2017-03-01 MED ORDER — ALBUTEROL SULFATE HFA 108 (90 BASE) MCG/ACT IN AERS: 1.0000 | INHALATION_SPRAY | Freq: Four times a day (QID) | RESPIRATORY_TRACT | 0 refills | Status: DC | PRN

## 2017-03-01 MED ORDER — ONDANSETRON HCL 4 MG PO TABS: 4.0000 mg | ORAL_TABLET | Freq: Three times a day (TID) | ORAL | 0 refills | Status: DC | PRN

## 2017-03-01 MED ORDER — IPRATROPIUM-ALBUTEROL 0.5-2.5 (3) MG/3ML IN SOLN
3.0000 mL | Freq: Once | RESPIRATORY_TRACT | Status: AC
  Administered 2017-03-01: 3 mL via RESPIRATORY_TRACT

## 2017-03-01 MED ORDER — IPRATROPIUM-ALBUTEROL 0.5-2.5 (3) MG/3ML IN SOLN
RESPIRATORY_TRACT | Status: AC
  Filled 2017-03-01: qty 3

## 2017-03-01 MED ORDER — ONDANSETRON 4 MG PO TBDP
4.0000 mg | ORAL_TABLET | Freq: Once | ORAL | Status: AC
  Administered 2017-03-01: 4 mg via ORAL

## 2017-03-01 MED ORDER — PREDNISONE 20 MG PO TABS: 40.0000 mg | ORAL_TABLET | Freq: Every day | ORAL | 0 refills | Status: AC

## 2017-03-01 NOTE — Discharge Instructions (Signed)
Push fluids to ensure adequate hydration and keep secretions thin.  Use of inhaler as needed for wheezing 5 days of prednisone. Zofran as needed for nausea. If symptoms worsen or do not improve in the next week to return to be seen or to follow up with your PCP.

## 2017-03-01 NOTE — ED Provider Notes (Signed)
[facility]    CSN: [id number] Arrival date & time: 03/01/17  1119     History   Chief Complaint Chief Complaint  Patient presents with  . Shortness of Breath  . Emesis  . Cough    HPI Kristin Wright is a 32 y.o. female.   Kristin Wright presents with complaints of worsening cough, congestion and shortness of breath which started 1/5. She states two weeks ago she had cold symptoms but had improved, followed by her son getting a sinus infection. She states she has congestion worse at night. Coughing and activity increases cough and shortness of breath. It is productive of phlegm. Without fevers. Mild back ache. Without ear or throat pain. Denies urinary symptoms. She does smoke, without history of asthma. Has not taken any medications for symptoms. States she also has had vomiting for the past 4 days, approximately 3-5 times a day she has vomited. Mild abdominal pain, minimal nausea. Normal bowel movements, last was two days ago. Denies previous similar. Has been drinking fluids. Denies any previous similar illness.  She has not recently traveled, she is not on oral birth control, without leg pain or swelling.    ROS per HPI.       History reviewed. No pertinent past medical history.  There are no active problems to display for this patient.   History reviewed. No pertinent surgical history.  OB History    Gravida Para Term Preterm AB Living   1             SAB TAB Ectopic Multiple Live Births                   Home Medications    Prior to Admission medications   Medication Sig Start Date End Date Taking? Authorizing Provider  albuterol (PROVENTIL HFA;VENTOLIN HFA) 108 (90 Base) MCG/ACT inhaler Inhale 1-2 puffs into the lungs every 6 (six) hours as needed for wheezing or shortness of breath. 03/01/17   Georgetta Haber, NP  omeprazole (PRILOSEC) 40 MG capsule Take 1 capsule (40 mg total) by mouth 2 (two) times daily. 08/17/16 08/31/16  Dorena Bodo, NP    ondansetron (ZOFRAN) 4 MG tablet Take 1 tablet (4 mg total) by mouth every 8 (eight) hours as needed for nausea or vomiting. 03/01/17   Georgetta Haber, NP  predniSONE (DELTASONE) 20 MG tablet Take 2 tablets (40 mg total) by mouth daily with breakfast for 5 days. 03/01/17 03/06/17  Georgetta Haber, NP    Family History History reviewed. No pertinent family history.  Social History Social History   Tobacco Use  . Smoking status: Current Every Day Smoker    Packs/day: 0.50    Types: Cigarettes  . Smokeless tobacco: Never Used  Substance Use Topics  . Alcohol use: No  . Drug use: Yes    Frequency: 7.0 times per week    Types: Marijuana     Allergies   Aspirin and Penicillins   Review of Systems Review of Systems   Physical Exam Triage Vital Signs ED Triage Vitals  Enc Vitals Group     BP 03/01/17 1144 (!) 136/104     Pulse Rate 03/01/17 1144 84     Resp --      Temp 03/01/17 1144 98.5 F (36.9 C)     Temp Source 03/01/17 1144 Oral     SpO2 03/01/17 1144 97 %     Weight --      Height --  Head Circumference --      Peak Flow --      Pain Score 03/01/17 1139 6     Pain Loc --      Pain Edu? --      Excl. in GC? --    No data found.  Updated Vital Signs BP (!) 136/104 (BP Location: Right Wrist)   Pulse 88 Comment: post breathing tx  Temp 98.5 F (36.9 C) (Oral)   Resp 16   LMP 02/15/2017 (Exact Date)   SpO2 100%   Visual Acuity Right Eye Distance:   Left Eye Distance:   Bilateral Distance:    Right Eye Near:   Left Eye Near:    Bilateral Near:     Physical Exam  Constitutional: She is oriented to person, place, and time. She appears well-developed and well-nourished. No distress.  HENT:  Head: Normocephalic and atraumatic.  Right Ear: Tympanic membrane, external ear and ear canal normal.  Left Ear: Tympanic membrane, external ear and ear canal normal.  Nose: Nose normal.  Mouth/Throat: Uvula is midline, oropharynx is clear and moist and  mucous membranes are normal. No tonsillar exudate.  Eyes: Conjunctivae and EOM are normal. Pupils are equal, round, and reactive to light.  Cardiovascular: Normal rate, regular rhythm and normal heart sounds.  Pulmonary/Chest: Effort normal. She has wheezes.  Wheezes throughout  Abdominal: Soft. Normal appearance. There is generalized tenderness and tenderness in the epigastric area. There is no rigidity, no rebound, no guarding, no CVA tenderness, no tenderness at McBurney's point and negative Murphy's sign.  Neurological: She is alert and oriented to person, place, and time.  Skin: Skin is warm and dry.     UC Treatments / Results  Labs (all labs ordered are listed, but only abnormal results are displayed) Labs Reviewed  POCT PREGNANCY, URINE    EKG  EKG Interpretation None       Radiology Dg Chest 2 View  Result Date: 03/01/2017 CLINICAL DATA:  Cough and congestion for 1 week. EXAM: CHEST  2 VIEW COMPARISON:  CT chest and PA and lateral chest 01/28/2005. FINDINGS: The lungs are clear. Heart size is normal. No pneumothorax or pleural fluid. No bony abnormality. IMPRESSION: Negative chest. Electronically Signed   By: Drusilla Kannerhomas  Dalessio M.D.   On: 03/01/2017 13:02   Dg Abd 1 View  Result Date: 03/01/2017 CLINICAL DATA:  Cough.  Congestion. EXAM: ABDOMEN - 1 VIEW COMPARISON:  CT 04/18/2003. FINDINGS: Soft tissue structures are unremarkable. Stool noted throughout the colon. No bowel distention or free air. No acute bony abnormality. IMPRESSION: No acute abnormality. Electronically Signed   By: Maisie Fushomas  Register   On: 03/01/2017 13:01    Procedures Procedures (including critical care time)  Medications Ordered in UC Medications  ipratropium-albuterol (DUONEB) 0.5-2.5 (3) MG/3ML nebulizer solution 3 mL (3 mLs Nebulization Given 03/01/17 1233)  ondansetron (ZOFRAN-ODT) disintegrating tablet 4 mg (4 mg Oral Given 03/01/17 1233)     Initial Impression / Assessment and Plan / UC  Course  I have reviewed the triage vital signs and the nursing notes.  Pertinent labs & imaging results that were available during my care of the patient were reviewed by me and considered in my medical decision making (see chart for details).     Significant improvement of lung sounds s/p duoneb. Patient states feels much improved. Chest and abdomen xray without acute findings. Without fevers. She does smoke. Likely viral in nature. Albuterol and prednisone provided. Non specific abdominal findings, push fluids.  zofran as needed. Return precautions provided. Patient verbalized understanding and agreeable to plan.  If symptoms worsen or do not improve in the next week to return to be seen or to follow up with PCP.    Final Clinical Impressions(s) / UC Diagnoses   Final diagnoses:  Bronchitis  Viral illness    ED Discharge Orders        Ordered    ondansetron (ZOFRAN) 4 MG tablet  Every 8 hours PRN     03/01/17 1321    predniSONE (DELTASONE) 20 MG tablet  Daily with breakfast     03/01/17 1321    albuterol (PROVENTIL HFA;VENTOLIN HFA) 108 (90 Base) MCG/ACT inhaler  Every 6 hours PRN     03/01/17 1321       Controlled Substance Prescriptions  Controlled Substance Registry consulted? Not Applicable   Georgetta Haber, NP 03/01/17 1324

## 2017-03-01 NOTE — ED Triage Notes (Addendum)
Pt reports cold symptoms for well over a week.  About a week ago she started suffering from vomiting 4x's or more a day, SOB, and cough.  She states sometimes when she is coughing she loses bladder control.  Pt is requesting a pregnancy test.

## 2017-07-03 ENCOUNTER — Other Ambulatory Visit (HOSPITAL_COMMUNITY)
Admission: RE | Admit: 2017-07-03 | Discharge: 2017-07-03 | Disposition: A | Discharge status: Home / Self Care | Payer: Medicaid Other | Source: Ambulatory Visit | Attending: Obstetrics and Gynecology | Admitting: Obstetrics and Gynecology

## 2017-07-03 ENCOUNTER — Encounter: Payer: Self-pay | Admitting: *Deleted

## 2017-07-03 ENCOUNTER — Ambulatory Visit (INDEPENDENT_AMBULATORY_CARE_PROVIDER_SITE_OTHER): Payer: Medicaid Other | Admitting: Obstetrics and Gynecology

## 2017-07-03 ENCOUNTER — Encounter: Payer: Self-pay | Admitting: Obstetrics and Gynecology

## 2017-07-03 VITALS — BP 127/83 | HR 78 | Ht 68.0 in | Wt 310.2 lb

## 2017-07-03 DIAGNOSIS — Z01419 Encounter for gynecological examination (general) (routine) without abnormal findings: Secondary | ICD-10-CM | POA: Insufficient documentation

## 2017-07-03 DIAGNOSIS — Z0000 Encounter for general adult medical examination without abnormal findings: Secondary | ICD-10-CM | POA: Diagnosis not present

## 2017-07-03 DIAGNOSIS — Z30017 Encounter for initial prescription of implantable subdermal contraceptive: Secondary | ICD-10-CM

## 2017-07-03 DIAGNOSIS — Z3046 Encounter for surveillance of implantable subdermal contraceptive: Secondary | ICD-10-CM

## 2017-07-03 DIAGNOSIS — Z3202 Encounter for pregnancy test, result negative: Secondary | ICD-10-CM

## 2017-07-03 LAB — POCT URINE PREGNANCY: Preg Test, Ur: NEGATIVE

## 2017-07-03 MED ORDER — ETONOGESTREL 68 MG ~~LOC~~ IMPL
68.0000 mg | DRUG_IMPLANT | Freq: Once | SUBCUTANEOUS | Status: AC
  Administered 2017-07-03: 68 mg via SUBCUTANEOUS

## 2017-07-03 NOTE — Progress Notes (Signed)
Patient is in the office for annual, interested in [facility] options. Pt states that she has fainting spells, triggered by bright lights. Pt informed of additional charges for cultures due to [facility], pt agreed.

## 2017-07-03 NOTE — Progress Notes (Signed)
Subjective:     Kristin Wright is a 32 y.o. female [phone] with BMI 47 who is here for a comprehensive physical exam. The patient reports no problems. She is sexually active without contraception. She is interested in Nexplanon for birth control. She reports a monthly period of 3-4 days. She denies any pelvic pain or abnormal discharge. She reports the presence of a vaginal odor for the past month without any pruritis.  Past Medical History:  Diagnosis Date  . Fainting 2004   triggered by bright lights   History reviewed. No pertinent surgical history. Family History  Problem Relation Age of Onset  . Breast cancer Maternal Grandmother   . Cervical cancer Maternal Grandmother   . Hypertension Maternal Grandmother   . Diabetes Maternal Grandmother   . Diabetes Maternal Grandfather   . Heart disease Maternal Grandfather   . Hyperlipidemia Maternal Grandfather     Social History   Socioeconomic History  . Marital status: Single    Spouse name: Not on file  . Number of children: Not on file  . Years of education: Not on file  . Highest education level: Not on file  Occupational History  . Not on file  Social Needs  . Financial resource strain: Not on file  . Food insecurity:    Worry: Not on file    Inability: Not on file  . Transportation needs:    Medical: Not on file    Non-medical: Not on file  Tobacco Use  . Smoking status: Current Every Day Smoker    Packs/day: 0.50    Types: Cigarettes  . Smokeless tobacco: Never Used  Substance and Sexual Activity  . Alcohol use: No  . Drug use: Yes    Frequency: 7.0 times per week    Types: Marijuana  . Sexual activity: Yes    Birth control/protection: None  Lifestyle  . Physical activity:    Days per week: Not on file    Minutes per session: Not on file  . Stress: Not on file  Relationships  . Social connections:    Talks on phone: Not on file    Gets together: Not on file    Attends religious service: Not on file    Active member of club or organization: Not on file    Attends meetings of clubs or organizations: Not on file    Relationship status: Not on file  . Intimate partner violence:    Fear of current or ex partner: Not on file    Emotionally abused: Not on file    Physically abused: Not on file    Forced sexual activity: Not on file  Other Topics Concern  . Not on file  Social History Narrative  . Not on file   Health Maintenance  Topic Date Due  . HIV Screening  05/29/2000  . TETANUS/TDAP  05/29/2004  . PAP SMEAR  05/30/2006  . INFLUENZA VACCINE  09/19/2017       Review of Systems Pertinent items are noted in HPI.   Objective:  Blood pressure 127/83, pulse 78, height  (1.727 m), weight (!) 310 lb 3.2 oz (140.7 kg).     GENERAL: Well-developed, well-nourished female in no acute distress.  HEENT: Normocephalic, atraumatic. Sclerae anicteric.  NECK: Supple. Normal thyroid.  LUNGS: Clear to auscultation bilaterally.  HEART: Regular rate and rhythm. BREASTS: Symmetric in size. No palpable masses or lymphadenopathy, skin changes, or nipple drainage. ABDOMEN: Soft, nontender, nondistended. No organomegaly. PELVIC: Normal external  female genitalia. Vagina is pink and rugated.  Normal discharge. Normal appearing cervix. Uterus is normal in size. No adnexal mass or tenderness. EXTREMITIES: No cyanosis, clubbing, or edema, 2+ distal pulses.    Assessment:    Healthy female exam.      Plan:    Pap smear collected Wet prep collected to evaluate vaginal odor Patient will be contacted with abnormal results Weight loss management reviewed  Nexplanon insertion Patient given informed consent, signed copy in the chart, time out was performed. Pregnancy test was negative. Appropriate time out taken.  Patient's left arm was prepped and draped in the usual sterile fashion.. The ruler used to measure and mark insertion area.  Patient was prepped with alcohol swab and then injected  with 3 cc of 1% lidocaine with epinephrine.  Patient was prepped with betadine, Nexplanon removed form packaging.  Device confirmed in needle, then inserted full length of needle and withdrawn per handbook instructions.  Patient insertion site covered with a band-aid and pressure dressing.   Minimal blood loss.  Patient tolerated the procedure well.    See After Visit Summary for Counseling Recommendations

## 2017-07-04 LAB — LIPID PANEL
Chol/HDL Ratio: 4.5 ratio — ABNORMAL HIGH (ref 0.0–4.4)
Cholesterol, Total: 159 mg/dL (ref 100–199)
HDL: 35 mg/dL — ABNORMAL LOW (ref >39)
LDL Calculated: 111 mg/dL — ABNORMAL HIGH (ref 0–99)
Triglycerides: 65 mg/dL (ref 0–149)
VLDL CHOLESTEROL CAL: 13 mg/dL (ref 5–40)

## 2017-07-04 LAB — HEMOGLOBIN A1C
Est. average glucose Bld gHb Est-mCnc: 97 mg/dL
Hgb A1c MFr Bld: 5 % (ref 4.8–5.6)

## 2017-07-04 LAB — COMPREHENSIVE METABOLIC PANEL WITH GFR
ALT: 13 IU/L (ref 0–32)
AST: 14 IU/L (ref 0–40)
Albumin/Globulin Ratio: 1.4 (ref 1.2–2.2)
Albumin: 4 g/dL (ref 3.5–5.5)
Alkaline Phosphatase: 71 IU/L (ref 39–117)
BUN/Creatinine Ratio: 13 (ref 9–23)
BUN: 11 mg/dL (ref 6–20)
Bilirubin Total: <0.2 mg/dL (ref 0.0–1.2)
CO2: 23 mmol/L (ref 20–29)
Calcium: 9.3 mg/dL (ref 8.7–10.2)
Chloride: 103 mmol/L (ref 96–106)
Creatinine, Ser: 0.86 mg/dL (ref 0.57–1.00)
GFR calc Af Amer: 103 mL/min/1.73
GFR calc non Af Amer: 90 mL/min/1.73
Globulin, Total: 2.9 g/dL (ref 1.5–4.5)
Glucose: 78 mg/dL (ref 65–99)
Potassium: 4.7 mmol/L (ref 3.5–5.2)
Sodium: 140 mmol/L (ref 134–144)
Total Protein: 6.9 g/dL (ref 6.0–8.5)

## 2017-07-04 LAB — CBC
Hematocrit: 38.2 % (ref 34.0–46.6)
Hemoglobin: 12.1 g/dL (ref 11.1–15.9)
MCH: 30.4 pg (ref 26.6–33.0)
MCHC: 31.7 g/dL (ref 31.5–35.7)
MCV: 96 fL (ref 79–97)
Platelets: 418 x10E3/uL — ABNORMAL HIGH (ref 150–379)
RBC: 3.98 x10E6/uL (ref 3.77–5.28)
RDW: 14.5 % (ref 12.3–15.4)
WBC: 5.7 x10E3/uL (ref 3.4–10.8)

## 2017-07-04 LAB — TSH: TSH: 1.62 u[IU]/mL (ref 0.450–4.500)

## 2017-07-05 LAB — CYTOLOGY - PAP
BACTERIAL VAGINITIS: POSITIVE — AB
Candida vaginitis: NEGATIVE
Chlamydia: NEGATIVE
Diagnosis: NEGATIVE
HPV: NOT DETECTED
Neisseria Gonorrhea: NEGATIVE
Trichomonas: POSITIVE — AB

## 2017-07-07 MED ORDER — METRONIDAZOLE 500 MG PO TABS: 500.0000 mg | ORAL_TABLET | Freq: Two times a day (BID) | ORAL | 0 refills | Status: DC

## 2017-07-07 NOTE — Addendum Note (Signed)
Addended by: Catalina Antigua on: 07/07/2017 04:44 PM   Modules accepted: Orders

## 2018-01-29 ENCOUNTER — Other Ambulatory Visit: Payer: Self-pay

## 2018-02-03 MED ORDER — METRONIDAZOLE 500 MG PO TABS: 500.0000 mg | ORAL_TABLET | Freq: Two times a day (BID) | ORAL | 0 refills | Status: DC

## 2018-09-09 ENCOUNTER — Ambulatory Visit: Payer: Medicaid Other | Admitting: Obstetrics and Gynecology

## 2018-10-07 ENCOUNTER — Ambulatory Visit: Payer: Medicaid Other | Admitting: Obstetrics and Gynecology

## 2018-12-03 ENCOUNTER — Other Ambulatory Visit: Payer: Self-pay

## 2018-12-03 DIAGNOSIS — Z20822 Contact with and (suspected) exposure to covid-19: Secondary | ICD-10-CM

## 2018-12-05 LAB — NOVEL CORONAVIRUS, NAA: SARS-CoV-2, NAA: NOT DETECTED

## 2019-01-05 ENCOUNTER — Other Ambulatory Visit: Payer: Self-pay

## 2019-01-05 DIAGNOSIS — Z20822 Contact with and (suspected) exposure to covid-19: Secondary | ICD-10-CM

## 2019-01-07 LAB — NOVEL CORONAVIRUS, NAA: SARS-CoV-2, NAA: NOT DETECTED

## 2019-01-19 ENCOUNTER — Ambulatory Visit (HOSPITAL_COMMUNITY)
Admission: EM | Admit: 2019-01-19 | Discharge: 2019-01-19 | Disposition: A | Discharge status: Home / Self Care | Payer: Medicaid Other | Attending: Family Medicine | Admitting: Family Medicine

## 2019-01-19 ENCOUNTER — Other Ambulatory Visit: Payer: Self-pay

## 2019-01-19 ENCOUNTER — Encounter (HOSPITAL_COMMUNITY): Payer: Self-pay

## 2019-01-19 DIAGNOSIS — Z20828 Contact with and (suspected) exposure to other viral communicable diseases: Secondary | ICD-10-CM

## 2019-01-19 DIAGNOSIS — Z20822 Contact with and (suspected) exposure to covid-19: Secondary | ICD-10-CM

## 2019-01-19 DIAGNOSIS — G44201 Tension-type headache, unspecified, intractable: Secondary | ICD-10-CM | POA: Insufficient documentation

## 2019-01-19 DIAGNOSIS — Z5689 Other problems related to employment: Secondary | ICD-10-CM

## 2019-01-19 LAB — POCT RAPID STREP A: Streptococcus, Group A Screen (Direct): NEGATIVE

## 2019-01-19 MED ORDER — TIZANIDINE HCL 4 MG PO TABS: 4.0000 mg | ORAL_TABLET | Freq: Four times a day (QID) | ORAL | 0 refills | Status: DC | PRN

## 2019-01-19 NOTE — ED Provider Notes (Signed)
[place]    CSN: [id number] Arrival date & time: 01/19/19  1652      History   Chief Complaint Chief Complaint  Patient presents with  . Migraine    HPI Kristin Wright is a 33 y.o. female.   HPI  Patient is here for headache.  She had a headache every day for this week.  She admits that she is under a lot of stress at her job.  She carries for disabled individuals.  She has a 15 year old child at home.  She states her health has been pretty good.  She has been prone to migraines in the past.  This does not feel like a typical migraine.  Goes from the back of her head all the way around her forehead.  Some nausea.  No vomiting.  No visual symptoms.  She states when she turned her head today she had a burning sensation in the side of her neck.  No numbness or weakness into the arms.  No loss of strength or balance.  No fever chills body aches or signs of infection.  She is worried about coronavirus especially given the population she works with.  Past Medical History:  Diagnosis Date  . Fainting 2004   triggered by bright lights    There are no active problems to display for this patient.   History reviewed. No pertinent surgical history.  OB History    Gravida  4   Para  1   Term  1   Preterm      AB  3   Living  1     SAB  1   TAB  1   Ectopic  1   Multiple      Live Births  1            Home Medications    Prior to Admission medications   Medication Sig Start Date End Date Taking? Authorizing Provider  albuterol (PROVENTIL HFA;VENTOLIN HFA) 108 (90 Base) MCG/ACT inhaler Inhale 1-2 puffs into the lungs every 6 (six) hours as needed for wheezing or shortness of breath. 03/01/17   Zigmund Gottron, NP  tiZANidine (ZANAFLEX) 4 MG tablet Take 1-2 tablets (4-8 mg total) by mouth every 6 (six) hours as needed for muscle spasms. 01/19/19   Raylene Everts, MD  omeprazole (PRILOSEC) 40 MG capsule Take 1 capsule (40 mg total) by mouth 2  (two) times daily. 08/17/16 01/19/19  Barnet Glasgow, NP    Family History Family History  Problem Relation Age of Onset  . Breast cancer Maternal Grandmother   . Cervical cancer Maternal Grandmother   . Hypertension Maternal Grandmother   . Diabetes Maternal Grandmother   . Diabetes Maternal Grandfather   . Heart disease Maternal Grandfather   . Hyperlipidemia Maternal Grandfather   . Hypertension Mother   . Diabetes Mother   . Hyperlipidemia Mother   . Stroke Mother   . Asthma Father     Social History Social History   Tobacco Use  . Smoking status: Current Every Day Smoker    Packs/day: 0.50    Types: Cigarettes  . Smokeless tobacco: Never Used  Substance Use Topics  . Alcohol use: No  . Drug use: Yes    Frequency: 7.0 times per week    Types: Marijuana     Allergies   Aspirin and Penicillins   Review of Systems Review of Systems  Constitutional: Negative for chills and fever.  HENT: Negative for  ear pain and sore throat.   Eyes: Negative for pain and visual disturbance.  Respiratory: Negative for cough and shortness of breath.   Cardiovascular: Negative for chest pain and palpitations.  Gastrointestinal: Negative for abdominal pain and vomiting.  Genitourinary: Negative for dysuria and hematuria.  Musculoskeletal: Positive for neck stiffness. Negative for arthralgias and back pain.  Skin: Negative for color change and rash.  Neurological: Positive for headaches. Negative for seizures and syncope.  All other systems reviewed and are negative.    Physical Exam Triage Vital Signs ED Triage Vitals  Enc Vitals Group     BP 01/19/19 1842 (!) 137/105     Pulse Rate 01/19/19 1842 92     Resp 01/19/19 1842 16     Temp 01/19/19 1842 99.2 F (37.3 C)     Temp Source 01/19/19 1842 Oral     SpO2 01/19/19 1842 98 %     Weight --      Height --      Head Circumference --      Peak Flow --      Pain Score 01/19/19 1840 8     Pain Loc --      Pain Edu? --       Excl. in GC? --    No data found.  Updated Vital Signs BP (!) 137/105 (BP Location: Left Wrist) Comment (BP Location): taken on WRIST  Pulse 92   Temp 99.2 F (37.3 C) (Oral)   Resp 16   SpO2 98%     Physical Exam Constitutional:      General: She is not in acute distress.    Appearance: She is well-developed. She is obese.     Comments: No acute distress.  Pleasant and conversational  HENT:     Head: Normocephalic and atraumatic.     Right Ear: Tympanic membrane and ear canal normal.     Left Ear: Tympanic membrane and ear canal normal.     Nose: Nose normal.     Mouth/Throat:     Pharynx: Posterior oropharyngeal erythema present.     Comments: Slight erythema of tonsils right greater than left Eyes:     Conjunctiva/sclera: Conjunctivae normal.     Pupils: Pupils are equal, round, and reactive to light.  Neck:     Musculoskeletal: Normal range of motion.     Comments: Slow but full range of motion.  Tenderness in the upper trapezius and neck muscles Cardiovascular:     Rate and Rhythm: Normal rate and regular rhythm.     Heart sounds: Normal heart sounds.  Pulmonary:     Effort: Pulmonary effort is normal. No respiratory distress.     Breath sounds: Normal breath sounds.  Abdominal:     General: There is no distension.     Palpations: Abdomen is soft.  Musculoskeletal: Normal range of motion.  Lymphadenopathy:     Cervical: No cervical adenopathy.  Skin:    General: Skin is warm and dry.  Neurological:     General: No focal deficit present.     Mental Status: She is alert.  Psychiatric:        Mood and Affect: Mood normal.        Behavior: Behavior normal.      UC Treatments / Results  Labs (all labs ordered are listed, but only abnormal results are displayed) Labs Reviewed  NOVEL CORONAVIRUS, NAA (HOSP ORDER, SEND-OUT TO REF LAB; TAT 18-24 HRS)  POCT RAPID STREP A  EKG   Radiology No results found.  Procedures Procedures (including  critical care time)  Medications Ordered in UC Medications - No data to display  Initial Impression / Assessment and Plan / UC Course  I have reviewed the triage vital signs and the nursing notes.  Pertinent labs & imaging results that were available during my care of the patient were reviewed by me and considered in my medical decision making (see chart for details).     Reviewed this is likely muscle tension headaches.  It may be from her increased stress.  I recommend anti-inflammatory medications muscle relaxers.  She is given written information about tension headaches. There exists the possibility the daily headache could be from Covid.  We are going to test her in quarantine until test results are available Final Clinical Impressions(s) / UC Diagnoses   Final diagnoses:  Acute intractable tension-type headache  Encounter for laboratory testing for COVID-19 virus     Discharge Instructions     Take ibuprofen 3 times a day as needed for neck pain Take the muscle relaxer if for the tense painful muscles.  This is useful at bedtime Get plenty of rest You must quarantine at home until the coronavirus test is available Your test results will be available in MyChart Call for questions or problems   ED Prescriptions    Medication Sig Dispense Auth. Provider   tiZANidine (ZANAFLEX) 4 MG tablet Take 1-2 tablets (4-8 mg total) by mouth every 6 (six) hours as needed for muscle spasms. 21 tablet Eustace MooreNelson,  Sue, MD     PDMP not reviewed this encounter.   Eustace MooreNelson,  Sue, MD 01/19/19 [phone]

## 2019-01-19 NOTE — ED Triage Notes (Signed)
Patient presents to Urgent Care with complaints of migraine since 8 days ago. Patient reports when she turned her neck to the left today, it felt like her head caught on fire. Pt also feels like she has a foreign body in her throat, worse when she swallows.

## 2019-01-19 NOTE — Discharge Instructions (Signed)
Take ibuprofen 3 times a day as needed for neck pain Take the muscle relaxer if for the tense painful muscles.  This is useful at bedtime Get plenty of rest You must quarantine at home until the coronavirus test is available Your test results will be available in MyChart Call for questions or problems

## 2019-01-21 LAB — NOVEL CORONAVIRUS, NAA (HOSP ORDER, SEND-OUT TO REF LAB; TAT 18-24 HRS): SARS-CoV-2, NAA: NOT DETECTED

## 2019-07-08 ENCOUNTER — Other Ambulatory Visit: Payer: Self-pay

## 2019-07-08 ENCOUNTER — Encounter (HOSPITAL_COMMUNITY): Payer: Self-pay

## 2019-07-08 ENCOUNTER — Ambulatory Visit (HOSPITAL_COMMUNITY)
Admission: EM | Admit: 2019-07-08 | Discharge: 2019-07-08 | Disposition: A | Discharge status: Home / Self Care | Payer: Medicaid Other | Attending: Emergency Medicine | Admitting: Emergency Medicine

## 2019-07-08 DIAGNOSIS — M79A22 Nontraumatic compartment syndrome of left lower extremity: Secondary | ICD-10-CM

## 2019-07-08 MED ORDER — LIDOCAINE HCL (PF) 2 % IJ SOLN
INTRAMUSCULAR | Status: AC
  Filled 2019-07-08: qty 10

## 2019-07-08 MED ORDER — LIDOCAINE HCL (PF) 1 % IJ SOLN
INTRAMUSCULAR | Status: AC
  Filled 2019-07-08: qty 30

## 2019-07-08 NOTE — ED Notes (Signed)
Omer Jack, PA informed pt that ortho surgeon will come here to eval pt.

## 2019-07-08 NOTE — Consult Note (Signed)
Reason for Consult: Left foot pain Referring Physician: Dr. Derryl Harbor Kristin Wright is an 34 y.o. female.  HPI: Kristin Wright with left foot pain.  She describes walking about 2 miles yesterday and having some achy left leg pain.  Describes diminished ability to dorsiflex her ankle tonight along with some paresthesias in the foot.  She has had this going on for about a year.  Has worsened since she has been doing more walking.  Tonight was the first night she actually had any weakness in the foot.  Presents now for clinical evaluation.  Denies any back pain.  Denies any history of trauma to the lower extremities or any type of physical endeavors such as running. Past Medical History:  Diagnosis Date  . Fainting 2004   triggered by bright lights    No past surgical history on file.  Family History  Problem Relation Age of Onset  . Breast cancer Maternal Grandmother   . Cervical cancer Maternal Grandmother   . Hypertension Maternal Grandmother   . Diabetes Maternal Grandmother   . Diabetes Maternal Grandfather   . Heart disease Maternal Grandfather   . Hyperlipidemia Maternal Grandfather   . Hypertension Mother   . Diabetes Mother   . Hyperlipidemia Mother   . Stroke Mother   . Asthma Father     Social History:  reports that she has been smoking cigarettes. She has been smoking about 0.25 packs per day. She has never used smokeless tobacco. She reports current drug use. Frequency: 7.00 times per week. Drug: Marijuana. She reports that she does not drink alcohol.  Allergies:  Allergies  Allergen Reactions  . Aspirin Anaphylaxis  . Penicillins Other (See Comments)    unknown    Medications: I have reviewed the [name] current medications.  No results found for this or any previous visit (from the past 48 hour(s)).  No results found.  Review of Systems  Musculoskeletal: Positive for arthralgias.  All other systems reviewed and are negative.  Blood  pressure 126/79, pulse (!) 105, temperature 98.2 F (36.8 C), temperature source Oral, resp. rate 14, last menstrual period 07/01/2019, SpO2 99 %. Physical Exam  Constitutional: She appears well-developed.  HENT:  Head: Normocephalic.  Eyes: Pupils are equal, round, and reactive to light.  Cardiovascular: Normal rate.  Respiratory: Effort normal.  Musculoskeletal:     Cervical back: Normal range of motion.  Neurological: She is alert.  Skin: Skin is warm.  Psychiatric: She has a normal mood and affect.  Examination of the bilateral lower extremities demonstrates palpable pedal pulses.  She does have some paresthesias on the dorsal and to a lesser degree plantar aspect of the foot.  Anterior compartment slightly firm bilaterally.  No significant pain with passive ankle dorsiflexion plantarflexion.  She does have diminished ankle dorsiflexion strength at the initial evaluation but by the time we check compartment pressures approximately an hour and half later it had significantly improved to level 5- out of 5 strength on the left-hand side.  After explanation of the procedure the [name] left leg was prepped with alcohol and Betadine.  After numbing the skin only with Marcaine the compartments were measured using the Stryker device.  anterior compartment and 10 mmHg in the lateral compartment.   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  Tonight is first time she is actually had difficulty walking.  Pressure measurements 29 in the anterior compartment tendon the  lateral compartment.  Diastolic pressure 80.  Diagnosis is likely chronic exertional compartment syndrome worse on the left-hand side.  I think she would do well to get this released just so she has diminished pain with ambulation.  Right leg much less symptomatic and not really indicated for surgery at this time.  Recommend activity modification plus follow-up  in clinic in 2 days so we can get her scheduled for next week.  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  She will have to curtail her activities in order to allow for [place] to arrange a time to do this electively next week.  Currently no indication for surgery tonight.  Kristin Wright Kristin Wright 07/08/2019, 7:00 PM

## 2019-07-08 NOTE — ED Provider Notes (Addendum)
[place]    CSN: [id number] Arrival date & time: 07/08/19  1442      History   Chief Complaint Chief Complaint  Patient presents with  . foot numbness  . Leg Pain    HPI Kristin Wright is a 34 y.o. female.   Patient reports urgent care for evaluation of 1 day history of left foot numbness and inability to move the foot.  She reports this started yesterday after stepping up into her truck and feeling shooting pain through her foot and into her leg.  She reports after driving for a bit getting out she noticed that she could not feel her left foot.  She did pay much attention to this until little bit later in the day when she further noticed she could not feel anything on the bottom or top of her foot.  She reports that this time she is beginning to drag the foot could not move it properly.  She reports the numbness is only in the foot and does not spread up into her lower leg at all.  She also noted pain shooting up into her lower leg when she was walking.  She denies any significant swelling in the leg or foot.  She does report she has had on and off pain in the front of both legs with walking for quite some time.  She reports is been getting worse of late.  She does report the day prior to symptoms she presents today with she walks a good deal further than she normally does, close to double.      Past Medical History:  Diagnosis Date  . Fainting 2004   triggered by bright lights    There are no problems to display for this patient.   No past surgical history on file.  OB History    Gravida  4   Para  1   Term  1   Preterm      AB  3   Living  1     SAB  1   TAB  1   Ectopic  1   Multiple      Live Births  1            Home Medications    Prior to Admission medications   Medication Sig Start Date End Date Taking? Authorizing Provider  albuterol (PROVENTIL HFA;VENTOLIN HFA) 108 (90 Base) MCG/ACT inhaler Inhale 1-2 puffs into the  lungs every 6 (six) hours as needed for wheezing or shortness of breath. 03/01/17   Zigmund Gottron, NP  tiZANidine (ZANAFLEX) 4 MG tablet Take 1-2 tablets (4-8 mg total) by mouth every 6 (six) hours as needed for muscle spasms. 01/19/19   Raylene Everts, MD  omeprazole (PRILOSEC) 40 MG capsule Take 1 capsule (40 mg total) by mouth 2 (two) times daily. 08/17/16 01/19/19  Barnet Glasgow, NP    Family History Family History  Problem Relation Age of Onset  . Breast cancer Maternal Grandmother   . Cervical cancer Maternal Grandmother   . Hypertension Maternal Grandmother   . Diabetes Maternal Grandmother   . Diabetes Maternal Grandfather   . Heart disease Maternal Grandfather   . Hyperlipidemia Maternal Grandfather   . Hypertension Mother   . Diabetes Mother   . Hyperlipidemia Mother   . Stroke Mother   . Asthma Father     Social History Social History   Tobacco Use  . Smoking status: Current Every Day Smoker  Packs/day: 0.25    Types: Cigarettes  . Smokeless tobacco: Never Used  Substance Use Topics  . Alcohol use: No  . Drug use: Yes    Frequency: 7.0 times per week    Types: Marijuana     Allergies   Aspirin and Penicillins   Review of Systems Review of Systems   Physical Exam Triage Vital Signs ED Triage Vitals  Enc Vitals Group     BP 07/08/19 1557 113/67     Pulse Rate 07/08/19 1557 89     Resp 07/08/19 1557 14     Temp 07/08/19 1557 98.2 F (36.8 C)     Temp Source 07/08/19 1557 Oral     SpO2 07/08/19 1557 99 %     Weight --      Height --      Head Circumference --      Peak Flow --      Pain Score 07/08/19 1556 10     Pain Loc --      Pain Edu? --      Excl. in GC? --    No data found.  Updated Vital Signs BP 126/79   Pulse (!) 105   Temp 98.2 F (36.8 C) (Oral)   Resp 14   LMP 07/01/2019 (Within Days)   SpO2 99%   Visual Acuity Right Eye Distance:   Left Eye Distance:   Bilateral Distance:    Right Eye Near:   Left Eye  Near:    Bilateral Near:     Physical Exam Vitals and nursing note reviewed.  Constitutional:      General: She is not in acute distress.    Appearance: She is well-developed.  HENT:     Head: Normocephalic and atraumatic.  Eyes:     Conjunctiva/sclera: Conjunctivae normal.  Cardiovascular:     Rate and Rhythm: Normal rate and regular rhythm.     Heart sounds: No murmur.  Pulmonary:     Effort: Pulmonary effort is normal. No respiratory distress.     Breath sounds: Normal breath sounds.  Musculoskeletal:     Cervical back: Neck supple.     Comments: There is is no swelling of the lower extremities   Patient unable to move the left foot.  There is no sensation to light, sharp touch throughout any area below the ankle and left foot.  Sensation is intact above the ankle throughout the entire leg.  Positional awareness in the great toe is altered, (patient reports downward position with toes upward).  No sensation to Babinski's and minimal to no Achilles reflex on the left.  Right side normal Babinski's and Achilles DTR.  No tenderness in the calf.  There is tenderness to palpation and some firmness over the anterior aspect of the tibial musculature.  There is pain elicited in the front of the leg with passive dorsiflexion and plantarflexion  5/5 strength at knee and hip.   Dorsalis pedis pulse 2+.  Cap refill less than 2 seconds.   Right foot and ankle all normal exam.   Skin:    General: Skin is warm and dry.  Neurological:     Mental Status: She is alert.     Cranial Nerves: No cranial nerve deficit.     Sensory: Sensory deficit present.     Motor: Weakness present.     Gait: Gait abnormal.     Deep Tendon Reflexes: Reflexes abnormal.     Comments: Speech fluent.   Upper extremity  5/5 strength with intact sensation.       UC Treatments / Results  Labs (all labs ordered are listed, but only abnormal results are displayed) Labs Reviewed - No data to  display  EKG   Radiology No results found.  Procedures Procedures (including critical care time)  Orthopedic surgeon Dr. Rise Paganini performed compartment tonometry.  Consent was obtained and procedure discussed with patient.  Compartment pressure found to be elevated at 30 in the anterior which is reported by orthopedics is elevated.  Medications Ordered in UC Medications - No data to display  Initial Impression / Assessment and Plan / UC Course  I have reviewed the triage vital signs and the nursing notes.  Pertinent labs & imaging results that were available during my care of the patient were reviewed by me and considered in my medical decision making (see chart for details).     #Compartment syndrome of left lower extremity Patient is 34 year old presenting with compartment syndrome of the left lower extremity.  Orthopedic on call was called and discussed the case.  Orthopedic surgeon Dr. Rise Paganini examined patient in urgent care clinic and performed measurement of compartment pressure.  Found to be elevated and consistent with compartment syndrome.  Orthopedics recommend rest and follow-up on Friday for possible procedure next week, as she currently still has pulses and good blood flow.  Patient did have some return of dorsiflexion after rest and elevation of the leg in clinic today.  It is likely that this is driven by exertion and she has been having elevated pressures for some time however it took until this point to have complete symptomatic presentation.  We will have her follow-up with Dr. August Saucer on Friday as instructed.  Instructed to minimize walking and to rest.  To elevate feet if and tingling or worsening numbness.  Return precautions were discussed.  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  Elevate your feet as instructed by the ortho team      ED Prescriptions    None     PDMP not reviewed this encounter.   Hermelinda Medicus, PA-C 07/08/19 2158    Kennet Mccort, Veryl Speak, PA-C 07/09/19 0102    Makalah Asberry, Veryl Speak, PA-C 07/09/19 1057

## 2019-07-08 NOTE — ED Notes (Addendum)
Ortho team in for needle pressure eval of  left calf. Pt stable.

## 2019-07-08 NOTE — Discharge Instructions (Addendum)
Minimize your walking  Follow up with the orthopedics as they have discussed with you.  Elevate your feet as instructed by the ortho team

## 2019-07-08 NOTE — ED Triage Notes (Signed)
Patient reports left foot numbness and leg pain since yesterday. Reports a sharp pain shoots up her leg when she steps on her left foot.

## 2019-07-10 ENCOUNTER — Ambulatory Visit (INDEPENDENT_AMBULATORY_CARE_PROVIDER_SITE_OTHER): Payer: Self-pay | Admitting: Orthopedic Surgery

## 2019-07-10 ENCOUNTER — Encounter: Payer: Self-pay | Admitting: Orthopedic Surgery

## 2019-07-10 ENCOUNTER — Other Ambulatory Visit: Payer: Self-pay

## 2019-07-10 VITALS — Ht 68.0 in | Wt 315.0 lb

## 2019-07-10 DIAGNOSIS — M79605 Pain in left leg: Secondary | ICD-10-CM

## 2019-07-10 NOTE — Progress Notes (Signed)
Office Visit Note   Patient: Kristin Wright           Date of Birth: [birth year]           MRN: [id number] Visit Date: 07/10/2019 Requested by: No referring provider defined for this encounter. PCP: Patient, No Pcp Per  Subjective: Chief Complaint  Patient presents with  . Left Foot - Numbness    HPI: Alexiss SARANNE CRISLIP is a 34 y.o. female who presents to the office complaining of left calf pain.  Patient was seen as a consult in urgent care on Wednesday.  She notes that she has numbness in her left foot that began after she stepped up into a truck on Monday night.  She is unable to feel the bottom of her foot or her toes currently.  She had reported to urgent care on Wednesday where they were suspicious of compartment syndrome.  This office was consulted and compartment pressures were measured and found to be 29 in the anterior compartment and 10-15 in the lateral compartment of the left leg..  She complains of left calf pain that has been ongoing for 4 months and gradually worsening.  This Pain is worse with activity, specifically walking.  She also notes numbness and tingling that has been worsening over the past 4 months that is worse with walking.  Today she states that the numbness and tingling is absent on the dorsal aspect of the foot but present on the plantar aspect which is a change from her Wednesday evaluation.  She localizes the symptoms to the anterior calf as well as into the left foot.  Her work involves a lot of walking which she has held off from doing currently.  She does note a history of back pain but states that she has never had a work-up before..                ROS:  All systems reviewed are negative as they relate to the chief complaint within the history of present illness.  Patient denies fevers or chills.  Assessment & Plan: Visit Diagnoses:  1. Pain in left leg     Plan: Patient is a 34 year old female presents complaining of left calf pain.  Patient has a  complicated clinical picture.  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  She has mildly increasing exertional pain and numbness and tingling over the last 4 months with walking.  She has just started having weakness in the left foot.  She does have mild weakness in the left foot today on exam but does dorsiflex her ankle and toes as well as plantar flex her foot.  There is tension in the anterior tibial tendon with manipulation of the left foot.  She is tender to palpation throughout the anterior calf and has minimal but not excruciating pain with passive dorsiflexion/plantarflexion of the foot.  However she has no numbness on the dorsal aspect of her foot, just on the plantar aspect.  Ordered MRI of the lumbar spine for urgent evaluation of this somewhat inconsistent clinical picture..  Also ordered nerve conduction study.  Want to rule out any other causes of numbness/weakness prior to proceeding with the planned fasciotomy.  Patient and mother agree with plan.  Follow-Up Instructions: No follow-ups on file.   Orders:  Orders Placed This Encounter  Procedures  . MR Lumbar Spine w/o contrast  . Ambulatory referral to Physical Medicine Rehab   No orders of the defined  types were placed in this encounter.     Procedures: No procedures performed   Clinical Data: No additional findings.  Objective: Vital Signs: Ht 5\' 8"  (1.727 m)   Wt (!) 315 lb (142.9 kg)   LMP 07/01/2019 (Within Days)   BMI 47.90 kg/m   Physical Exam:  Constitutional: Patient appears well-developed HEENT:  Head: Normocephalic Eyes:EOM are normal Neck: Normal range of motion Cardiovascular: Normal rate Pulmonary/chest: Effort normal Neurologic: Patient is alert Skin: Skin is warm Psychiatric: Patient has normal mood and affect  Ortho Exam:  Mild tenderness to palpation throughout the axial lumbar spine and paraspinal musculature.  Tender to palpation  throughout the anterior calf bilaterally..  No significant increased turgor of the left calf compared to the right calf.  Negative Homans bilaterally.  Only mild pain with passive dorsiflexion/plantarflexion of the left ankle.  Unable to wiggle toes on command but does dorsiflex toes and foot with passive manipulation of the ankle..  Some weakness of dorsiflexion but tendon is firing and foot can move provide some resistance.  Some weakness of plantarflexion as well.  Sensation diminished in the plantar aspect of the left foot as well as throughout the dorsal/plantar aspect of all 5 toes.  Sensation intact with very slight loss of sensation over the dorsal aspect of the foot.  2+ DP pulse.  Specialty Comments:  No specialty comments available.  Imaging: No results found.   PMFS History: There are no problems to display for this patient.  Past Medical History:  Diagnosis Date  . Fainting 2004   triggered by bright lights    Family History  Problem Relation Age of Onset  . Breast cancer Maternal Grandmother   . Cervical cancer Maternal Grandmother   . Hypertension Maternal Grandmother   . Diabetes Maternal Grandmother   . Diabetes Maternal Grandfather   . Heart disease Maternal Grandfather   . Hyperlipidemia Maternal Grandfather   . Hypertension Mother   . Diabetes Mother   . Hyperlipidemia Mother   . Stroke Mother   . Asthma Father     No past surgical history on file. Social History   Occupational History  . Not on file  Tobacco Use  . Smoking status: Current Every Day Smoker    Packs/day: 0.25    Types: Cigarettes  . Smokeless tobacco: Never Used  Substance and Sexual Activity  . Alcohol use: No  . Drug use: Yes    Frequency: 7.0 times per week    Types: Marijuana  . Sexual activity: Yes    Birth control/protection: None

## 2019-07-11 ENCOUNTER — Other Ambulatory Visit: Payer: Medicaid Other

## 2019-07-13 ENCOUNTER — Encounter: Payer: Self-pay | Admitting: Physical Medicine and Rehabilitation

## 2019-07-13 ENCOUNTER — Other Ambulatory Visit: Payer: Self-pay

## 2019-07-13 ENCOUNTER — Ambulatory Visit (INDEPENDENT_AMBULATORY_CARE_PROVIDER_SITE_OTHER): Payer: Self-pay | Admitting: Physical Medicine and Rehabilitation

## 2019-07-13 DIAGNOSIS — R202 Paresthesia of skin: Secondary | ICD-10-CM

## 2019-07-13 NOTE — Progress Notes (Signed)
Pain in anterior calf of left leg. "can't feel left foot." States that she could feel top of foot Friday but can't now. Cramping in both legs. Numeric Pain Rating Scale and Functional Assessment Average Pain 9   In the last MONTH (on 0-10 scale) has pain interfered with the following?  1. General activity like being  able to carry out your everyday physical activities such as walking, climbing stairs, carrying groceries, or moving a chair?  Rating(6)

## 2019-07-14 ENCOUNTER — Ambulatory Visit
Admission: RE | Admit: 2019-07-14 | Discharge: 2019-07-14 | Disposition: A | Discharge status: Home / Self Care | Payer: Self-pay | Source: Ambulatory Visit | Attending: Orthopedic Surgery | Admitting: Orthopedic Surgery

## 2019-07-14 DIAGNOSIS — M79605 Pain in left leg: Secondary | ICD-10-CM

## 2019-07-14 NOTE — Progress Notes (Signed)
Kristin Wright - 34 y.o. female MRN [id number]  Date of birth: 1985-12-28  Office Visit Note: Visit Date: 07/13/2019 PCP: Patient, No Pcp Per Referred by: Cammy Copa, MD  Subjective: Chief Complaint  Patient presents with  . Left Lower Leg - Pain, Numbness   HPI: Kristin Wright is a 34 y.o. female who comes in today At the request of Dr. Burnard Bunting for evaluation electrodiagnostic study of the left lower limb.  Her history is such that on Jul 08, 2019 she reported to the [facility] urgent care with history of fairly abrupt onset of numbness in the left foot after stepping up on a truck.  She reported profound numbness and really not being able to feel anything in the left foot particularly anterior part of the foot on the notes from the urgent care.  Compartment pressures were obtained and it did seem that she had increased pressures in the anterior compartment.  She had urgent follow-up with Dr. August Saucer in the office on Jul 10, 2019.  At that point according to his notes she was having more numbness and tingling in the calf and bottom of the foot which had changed to some degree.  He did order MRI of the lumbar spine which is scheduled for tomorrow.  Today she is complaining of pain in the anterior calf of the leg and reports that she cannot feel the left foot at all.  She reports that she could feel the top of the foot on Friday but cannot feel it at all at this point.  She does endorse cramping of both legs both calves.  This is been ongoing for several months.  She does have some back pain but nothing really down the leg from the hips down.  She has a family history of diabetes but she reports not being tested herself.  I did find a hemoglobin A1c measurement from 2 years ago which was 5.0 and normal.  She has not had prior electrodiagnostic study.  As of note today I did watch her walk into the office and walked out of the office and she ambulates without aid with normal gait other  than being slightly wide-based gait.  She has no hip hiking or circumduction or foot dragging upon ambulation.  ROS Otherwise per HPI.  Assessment & Plan: Visit Diagnoses:  1. Paresthesia of skin     Plan: No additional findings.   Meds & Orders: No orders of the defined types were placed in this encounter.   Orders Placed This Encounter  Procedures  . NCV with EMG (electromyography)    Follow-up: Return for Burnard Bunting, MD.   Procedures: No procedures performed  EMG & NCV Findings: Evaluation of the left fibular motor and the left tibial motor nerves showed reduced amplitude (L2.3, L2.6 mV).  All remaining nerves (as indicated in the following tables) were within normal limits.    All examined muscles (as indicated in the following table) showed no evidence of electrical instability.    Impression: Essentially NORMAL electrodiagnostic study of the left lower limb.  There is no significant electrodiagnostic evidence of nerve entrapment, lumbosacral plexopathy, lumbar radiculopathy or generalized peripheral neuropathy.  There is likely mild technical artifact do to body habitus.  With the patient subjective complaints of complete numbness as well as inability on command to move the foot but with normal electrodiagnostic studies differential diagnosis would be central nervous system issue such as stroke or multiple sclerosis.  With  ability to ambulate without aid with normal gait and inconsistency on exam and electrodiagnostic study findings the differential diagnosis would have to consider conversion disorder.   As you know, purely sensory or demyelinating radiculopathies and chemical radiculitis may not be detected with this particular electrodiagnostic study. This study would also not rule out small fiber neuropathy.  Recommendations: 1.  Follow-up with referring physician. 2.  Continue current management of symptoms. Consider Neurology  consultation.  ___________________________ Laurence Spates FAAPMR Board Certified, American Board of Physical Medicine and Rehabilitation    Nerve Conduction Studies Anti Sensory Summary Table   Stim Site NR Peak (ms) Norm Peak (ms) P-T Amp (V) Norm P-T Amp Site1 Site2 Delta-P (ms) Dist (cm) Vel (m/s) Norm Vel (m/s)  Left Saphenous Anti Sensory (Ant Med Mall)  30C  14cm    3.6 <4.4 15.4 >2 14cm Ant Med Mall 3.6 0.0  >32  Left Sup Fibular Anti Sensory (Ant Lat Mall)  30C  14 cm    3.6 <4.4 12.8 >5.0 14 cm Ant Lat Mall 3.6 14.0 39 >32  Left Sural Anti Sensory (Lat Mall)  30C  Calf    3.2 <4.0 7.8 >5.0 Calf Lat Mall 3.2 14.0 44 >35   Motor Summary Table   Stim Site NR Onset (ms) Norm Onset (ms) O-P Amp (mV) Norm O-P Amp Site1 Site2 Delta-0 (ms) Dist (cm) Vel (m/s) Norm Vel (m/s)  Left Fibular Motor (Ext Dig Brev)  29.7C  Ankle    3.9 <6.1 *2.3 >2.5 B Fib Ankle 7.7 37.0 48 >38  B Fib    11.6  3.5  Poplt B Fib 1.8 10.0 56 >40  Poplt    13.4  3.9         Left Tibial Motor (Abd Hall Brev)  30.1C  Ankle    4.0 <6.1 *2.6 >3.0 Knee Ankle 9.8 39.0 40 >35  Knee    13.8  0.3          EMG   Side Muscle Nerve Root Ins Act Fibs Psw Amp Dur Poly Recrt Int Fraser Din Comment  Left AntTibialis Dp Br Peron L4-5 Nml Nml Nml Nml Nml 0 Nml Nml   Left Fibularis Longus  Sup Br Peron L5-S1 Nml Nml Nml Nml Nml 0 Nml Nml   Left MedGastroc Tibial S1-2 Nml Nml Nml Nml Nml 0 Nml Nml   Left VastusMed Femoral L2-4 Nml Nml Nml Nml Nml 0 Nml Nml   Left BicepsFemS Sciatic L5-S1 Nml Nml Nml Nml Nml 0 Nml Nml     Nerve Conduction Studies Anti Sensory Left/Right Comparison   Stim Site L Lat (ms) R Lat (ms) L-R Lat (ms) L Amp (V) R Amp (V) L-R Amp (%) Site1 Site2 L Vel (m/s) R Vel (m/s) L-R Vel (m/s)  Saphenous Anti Sensory (Ant Med Mall)  30C  14cm 3.6   15.4   14cm Ant Med Mall     Sup Fibular Anti Sensory (Ant Lat Mall)  30C  14 cm 3.6   12.8   14 cm Ant Lat Mall 39    Sural Anti Sensory (Lat Mall)  30C   Calf 3.2   7.8   Calf Lat Mall 44     Motor Left/Right Comparison   Stim Site L Lat (ms) R Lat (ms) L-R Lat (ms) L Amp (mV) R Amp (mV) L-R Amp (%) Site1 Site2 L Vel (m/s) R Vel (m/s) L-R Vel (m/s)  Fibular Motor (Ext Dig Brev)  29.7C  Ankle 3.9   *  2.3   B Fib Ankle 48    B Fib 11.6   3.5   Poplt B Fib 56    Poplt 13.4   3.9         Tibial Motor (Abd Hall Brev)  30.1C  Ankle 4.0   *2.6   Knee Ankle 40    Knee 13.8   0.3            Waveforms:             Clinical History: No specialty comments available.   She reports that she has been smoking cigarettes. She has been smoking about 0.25 packs per day. She has never used smokeless tobacco. No results for input(s): HGBA1C, LABURIC in the last 8760 hours.  Objective:  VS:  HT:    WT:   BMI:     BP:   HR: bpm  TEMP: ( )  RESP:  Physical Exam Constitutional:      General: She is not in acute distress.    Appearance: Normal appearance. She is not ill-appearing.  HENT:     Head: Normocephalic and atraumatic.     Right Ear: External ear normal.     Left Ear: External ear normal.  Eyes:     Extraocular Movements: Extraocular movements intact.  Cardiovascular:     Rate and Rhythm: Normal rate.     Pulses: Normal pulses.  Musculoskeletal:        General: Tenderness present. No swelling.     Right lower leg: No edema.     Left lower leg: No edema.  Skin:    Findings: No erythema, lesion or rash.  Neurological:     Mental Status: She is alert and oriented to person, place, and time.     Sensory: Sensory deficit present.     Motor: Weakness present. No abnormal muscle tone.     Coordination: Coordination normal.     Gait: Gait normal.     Comments: On strength testing of the left lower extremity the patient to command cannot dorsiflex plantarflex or activate EHL.  With some distraction she does get resistance.  She ambulates without aid without foot drop without toe dragging without circumduction or hip hiking.  There  is normal muscle bulk of the lower limb and normal intrinsic musculature of the lower limb.  Sensation loss to light touch is global and nondermatomal and nonperipheral nerve distribution.  There is no allodynia no swelling no color change.  Psychiatric:        Mood and Affect: Mood normal.        Behavior: Behavior normal.     Ortho Exam  Imaging: MR Lumbar Spine w/o contrast  Result Date: 07/14/2019 CLINICAL DATA:  Tingling in the bilateral legs with numbness in the left foot. EXAM: MRI LUMBAR SPINE WITHOUT CONTRAST TECHNIQUE: Multiplanar, multisequence MR imaging of the lumbar spine was performed. No intravenous contrast was administered. COMPARISON:  None. FINDINGS: Segmentation:  5 lumbar type vertebrae Alignment:  Normal Vertebrae:  No fracture, evidence of discitis, or bone lesion. Conus medullaris and cauda equina: Conus extends to the T12-L1 level. Conus and cauda equina appear normal. Paraspinal and other soft tissues: Partially covered right pelvic cyst measuring 3.1 cm, simple where seen and most likely ovarian. Disc levels: L4-5 relative disc narrowing and desiccation with a small central disc protrusion that is noncompressive. L5-S1 mild disc desiccation with a small central noncompressive disc protrusion. Borderline facet spurring. IMPRESSION: 1. Diffusely patent canal and foramina.  2. 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 Electronically Signed   By: Marnee Spring M.D.   On: 07/14/2019 11:57    Past Medical/Family/Surgical/Social History: Medications & Allergies reviewed per EMR, new medications updated. There are no problems to display for this patient.  Past Medical History:  Diagnosis Date  . Fainting 2004   triggered by bright lights   Family History  Problem Relation Age of Onset  . Breast cancer Maternal Grandmother   . Cervical cancer Maternal Grandmother   . Hypertension Maternal Grandmother   . Diabetes Maternal Grandmother    . Diabetes Maternal Grandfather   . Heart disease Maternal Grandfather   . Hyperlipidemia Maternal Grandfather   . Hypertension Mother   . Diabetes Mother   . Hyperlipidemia Mother   . Stroke Mother   . Asthma Father    History reviewed. No pertinent surgical history. Social History   Occupational History  . Not on file  Tobacco Use  . Smoking status: Current Every Day Smoker    Packs/day: 0.25    Types: Cigarettes  . Smokeless tobacco: Never Used  Substance and Sexual Activity  . Alcohol use: No  . Drug use: Yes    Frequency: 7.0 times per week    Types: Marijuana  . Sexual activity: Yes    Birth control/protection: None

## 2019-07-15 NOTE — Procedures (Signed)
EMG & NCV Findings: Evaluation of the left fibular motor and the left tibial motor nerves showed reduced amplitude (L2.3, L2.6 mV).  All remaining nerves (as indicated in the following tables) were within normal limits.    All examined muscles (as indicated in the following table) showed no evidence of electrical instability.    Impression: Essentially NORMAL electrodiagnostic study of the left lower limb.  There is no significant electrodiagnostic evidence of nerve entrapment, lumbosacral plexopathy, lumbar radiculopathy or generalized peripheral neuropathy.  There is likely mild technical artifact do to body habitus.  With the patient subjective complaints of complete numbness as well as inability on command to move the foot but with normal electrodiagnostic studies differential diagnosis would be central nervous system issue such as stroke or multiple sclerosis.  With ability to ambulate without aid with normal gait and inconsistency on exam and electrodiagnostic study findings the differential diagnosis would have to consider conversion disorder.   As you know, purely sensory or demyelinating radiculopathies and chemical radiculitis may not be detected with this particular electrodiagnostic study. This study would also not rule out small fiber neuropathy.  Recommendations: 1.  Follow-up with referring physician. 2.  Continue current management of symptoms. Consider Neurology consultation.  ___________________________ Laurence Spates FAAPMR Board Certified, American Board of Physical Medicine and Rehabilitation    Nerve Conduction Studies Anti Sensory Summary Table   Stim Site NR Peak (ms) Norm Peak (ms) P-T Amp (V) Norm P-T Amp Site1 Site2 Delta-P (ms) Dist (cm) Vel (m/s) Norm Vel (m/s)  Left Saphenous Anti Sensory (Ant Med Mall)  30C  14cm    3.6 <4.4 15.4 >2 14cm Ant Med Mall 3.6 0.0  >32  Left Sup Fibular Anti Sensory (Ant Lat Mall)  30C  14 cm    3.6 <4.4 12.8 >5.0 14 cm Ant Lat  Mall 3.6 14.0 39 >32  Left Sural Anti Sensory (Lat Mall)  30C  Calf    3.2 <4.0 7.8 >5.0 Calf Lat Mall 3.2 14.0 44 >35   Motor Summary Table   Stim Site NR Onset (ms) Norm Onset (ms) O-P Amp (mV) Norm O-P Amp Site1 Site2 Delta-0 (ms) Dist (cm) Vel (m/s) Norm Vel (m/s)  Left Fibular Motor (Ext Dig Brev)  29.7C  Ankle    3.9 <6.1 *2.3 >2.5 B Fib Ankle 7.7 37.0 48 >38  B Fib    11.6  3.5  Poplt B Fib 1.8 10.0 56 >40  Poplt    13.4  3.9         Left Tibial Motor (Abd Hall Brev)  30.1C  Ankle    4.0 <6.1 *2.6 >3.0 Knee Ankle 9.8 39.0 40 >35  Knee    13.8  0.3          EMG   Side Muscle Nerve Root Ins Act Fibs Psw Amp Dur Poly Recrt Int Fraser Din Comment  Left AntTibialis Dp Br Peron L4-5 Nml Nml Nml Nml Nml 0 Nml Nml   Left Fibularis Longus  Sup Br Peron L5-S1 Nml Nml Nml Nml Nml 0 Nml Nml   Left MedGastroc Tibial S1-2 Nml Nml Nml Nml Nml 0 Nml Nml   Left VastusMed Femoral L2-4 Nml Nml Nml Nml Nml 0 Nml Nml   Left BicepsFemS Sciatic L5-S1 Nml Nml Nml Nml Nml 0 Nml Nml     Nerve Conduction Studies Anti Sensory Left/Right Comparison   Stim Site L Lat (ms) R Lat (ms) L-R Lat (ms) L Amp (V) R Amp (V) L-R Amp (%)  Site1 Site2 L Vel (m/s) R Vel (m/s) L-R Vel (m/s)  Saphenous Anti Sensory (Ant Med Mall)  30C  14cm 3.6   15.4   14cm Ant Med Mall     Sup Fibular Anti Sensory (Ant Lat Mall)  30C  14 cm 3.6   12.8   14 cm Ant Lat Mall 39    Sural Anti Sensory (Lat Mall)  30C  Calf 3.2   7.8   Calf Lat Mall 44     Motor Left/Right Comparison   Stim Site L Lat (ms) R Lat (ms) L-R Lat (ms) L Amp (mV) R Amp (mV) L-R Amp (%) Site1 Site2 L Vel (m/s) R Vel (m/s) L-R Vel (m/s)  Fibular Motor (Ext Dig Brev)  29.7C  Ankle 3.9   *2.3   B Fib Ankle 48    B Fib 11.6   3.5   Poplt B Fib 56    Poplt 13.4   3.9         Tibial Motor (Abd Hall Brev)  30.1C  Ankle 4.0   *2.6   Knee Ankle 40    Knee 13.8   0.3            Waveforms:

## 2019-07-23 ENCOUNTER — Ambulatory Visit (INDEPENDENT_AMBULATORY_CARE_PROVIDER_SITE_OTHER): Payer: Self-pay | Admitting: Orthopedic Surgery

## 2019-07-23 ENCOUNTER — Encounter: Payer: Self-pay | Admitting: Orthopedic Surgery

## 2019-07-23 DIAGNOSIS — M79A22 Nontraumatic compartment syndrome of left lower extremity: Secondary | ICD-10-CM

## 2019-07-23 NOTE — Progress Notes (Signed)
Office Visit Note   Patient: Kristin Wright           Date of Birth: 12/05/1985           MRN: [id number] Visit Date: 07/23/2019 Requested by: No referring provider defined for this encounter. PCP: Patient, No Pcp Per  Subjective: Chief Complaint  Patient presents with  . Follow-up    HPI: Ravneet presents for follow-up of MRI scan lumbar spine.  She was having some numbness and tingling in the both lower extremities.  Lumbar spine MRI was normal.  Small noncompressive protrusions not uncommon in this age group present.  Nerve study left lower extremity also normal.  Patient does have some activity related paresthesias in the foot on the plantar aspect more than the dorsal aspect.  Objectively what remains is compartment pressure measurement resting on 1 evaluation in urgent care of 29 mm on the left which does qualify as exertional compartment syndrome.  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  Denies any personal or family history of DVT.              ROS: All systems reviewed are negative as they relate to the chief complaint within the history of present illness.  Patient denies  fevers or chills.   Assessment & Plan: Visit Diagnoses:  1. 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  She states she can live with the right the weight is but the left bothers her on a fairly consistent basis with prolonged ambulation and walking.  I think this is something that may improve with compartment release.  On physical examination the anterior and lesser degree lateral compartment do feel tight compared to the posterior compartments on both legs but it is worse on the left-hand side.  I think with ambulation this would likely worsen.  Plan is compartment release after discussion of risk and benefits which  include but are not limited to infection nerve vessel damage incomplete pain relief contour difference left leg versus right leg as well as incomplete pain relief.  I do not think any plantar symptoms she is having will be relieved with this surgery.  Her posterior compartments are soft.  Pedal pulses are palpable.  Patient understands the risk and benefits and wishes to proceed at sometime in the future.  Follow-up with [place] at that time  Follow-Up Instructions: Return if symptoms worsen or fail to improve.   Orders:  No orders of the defined types were placed in this encounter.  No orders of the defined types were placed in this encounter.     Procedures: No procedures performed   Clinical Data: No additional findings.  Objective: Vital Signs: LMP 07/01/2019 (Within Days)   Physical Exam:   Constitutional: Patient appears well-developed HEENT:  Head: Normocephalic Eyes:EOM are normal Neck: Normal range of motion Cardiovascular: Normal rate Pulmonary/chest: Effort normal Neurologic: Patient is alert Skin: Skin is warm Psychiatric: Patient has normal mood and affect    Ortho Exam: Ortho exam demonstrates normal gait alignment.  Pedal pulses palpable.  Patient has a normal gait with no dropfoot.  Pretty reasonable anterior tib dorsiflexion plantarflexion strength bilaterally.  Anterior compartments do feel tighter compared to the posterior compartment on the left and right leg.  Negative Homans and no calf tenderness to direct palpation.  No  definite paresthesias today on the dorsal or plantar aspect of the foot.  Specialty Comments:  No specialty comments available.  Imaging: No results found.   PMFS History: There are no problems to display for this patient.  Past Medical History:  Diagnosis Date  . Fainting 2004   triggered by bright lights    Family History  Problem Relation Age of Onset  . Breast cancer Maternal Grandmother   . Cervical cancer Maternal  Grandmother   . Hypertension Maternal Grandmother   . Diabetes Maternal Grandmother   . Diabetes Maternal Grandfather   . Heart disease Maternal Grandfather   . Hyperlipidemia Maternal Grandfather   . Hypertension Mother   . Diabetes Mother   . Hyperlipidemia Mother   . Stroke Mother   . Asthma Father     History reviewed. No pertinent surgical history. Social History   Occupational History  . Not on file  Tobacco Use  . Smoking status: Current Every Day Smoker    Packs/day: 0.25    Types: Cigarettes  . Smokeless tobacco: Never Used  Substance and Sexual Activity  . Alcohol use: No  . Drug use: Yes    Frequency: 7.0 times per week    Types: Marijuana  . Sexual activity: Yes    Birth control/protection: None

## 2019-07-30 ENCOUNTER — Other Ambulatory Visit: Payer: Medicaid Other

## 2019-08-18 NOTE — Progress Notes (Signed)
[id number] [place], [place] - [address] AT [facility] [address] [place] [address] Phone: [phone] Fax: [phone]      Your procedure is scheduled on Thursday July 8  Report to [facility] Main Entrance "A" at 1015 A.M., and check in at the Admitting office.  Call this number if you have problems the morning of surgery:  [phone]  Call [phone] if you have any questions prior to your surgery date Monday-Friday 8am-4pm    Remember:  Do not eat after midnight the night before your surgery  You may drink clear liquids until 0915 am the morning of your surgery.   Clear liquids allowed are: Water, Non-Citrus Juices (without pulp), Carbonated Beverages, Clear Tea, Black Coffee Only, and Gatorade   Enhanced Recovery after Surgery for Orthopedics Enhanced Recovery after Surgery is a protocol used to improve the stress on your body and your recovery after surgery.  Patient Instructions  . The night before surgery:  o No food after midnight. ONLY clear liquids after midnight  .  Marland Kitchen The day of surgery (if you do NOT have diabetes):  o Drink ONE (1) Pre-Surgery Clear Ensure by 0915 am the morning of surgery o This drink was given to you during your hospital  pre-op appointment visit. o Finish the drink at the designated time by the pre-op nurse.  o Nothing else to drink after completing the  Pre-Surgery Clear Ensure.          If you have questions, please contact your surgeon's office.     Take these medicines the morning of surgery with A SIP OF WATER  Tylenol if needed for pain  As of today, STOP taking any Aspirin (unless otherwise instructed by your surgeon) and Aspirin containing products, Aleve, Naproxen, Ibuprofen, Motrin, Advil, Goody's, BC's, all herbal medications, fish oil, and all vitamins.                      Do not wear jewelry, make up, or nail polish            Do not wear lotions, powders,  perfumes, or deodorant.            Do not shave 48 hours prior to surgery.              Do not bring valuables to the hospital.            [facility] is not responsible for any belongings or valuables.  Do NOT Smoke (Tobacco/Vapping) or drink Alcohol 24 hours prior to your procedure If you use a CPAP at night, you may bring all equipment for your overnight stay.   Contacts, glasses, dentures or bridgework may not be worn into surgery.      For patients admitted to the hospital, discharge time will be determined by your treatment team.   Patients discharged the day of surgery will not be allowed to drive home, and someone needs to stay with them for 24 hours.    Special instructions:   [facility]- Preparing For Surgery  Before surgery, you can play an important role. Because skin is not sterile, your skin needs to be as free of germs as possible. You can reduce the number of germs on your skin by washing with CHG (chlorahexidine gluconate) Soap before surgery.  CHG is an antiseptic cleaner which kills germs and bonds with the skin to continue killing germs even after washing.  Oral Hygiene is also important to reduce your risk of infection.  Remember - BRUSH YOUR TEETH THE MORNING OF SURGERY WITH YOUR REGULAR TOOTHPASTE  Please do not use if you have an allergy to CHG or antibacterial soaps. If your skin becomes reddened/irritated stop using the CHG.  Do not shave (including legs and underarms) for at least 48 hours prior to first CHG shower. It is OK to shave your face.  Please follow these instructions carefully.   1. Shower the NIGHT BEFORE SURGERY and the MORNING OF SURGERY with CHG Soap.   2. If you chose to wash your hair, wash your hair first as usual with your normal shampoo.  3. After you shampoo, rinse your hair and body thoroughly to remove the shampoo.  4. Use CHG as you would any other liquid soap. You can apply CHG directly to the skin and wash gently with a  scrungie or a clean washcloth.   5. Apply the CHG Soap to your body ONLY FROM THE NECK DOWN.  Do not use on open wounds or open sores. Avoid contact with your eyes, ears, mouth and genitals (private parts). Wash Face and genitals (private parts)  with your normal soap.   6. Wash thoroughly, paying special attention to the area where your surgery will be performed.  7. Thoroughly rinse your body with warm water from the neck down.  8. DO NOT shower/wash with your normal soap after using and rinsing off the CHG Soap.  9. Pat yourself dry with a CLEAN TOWEL.  10. Wear CLEAN PAJAMAS to bed the night before surgery, wear comfortable clothes the morning of surgery  11. Place CLEAN SHEETS on your bed the night of your first shower and DO NOT SLEEP WITH PETS.   Day of Surgery:   Do not apply any deodorants/lotions.  Please wear clean clothes to the hospital/surgery center.   Remember to brush your teeth WITH YOUR REGULAR TOOTHPASTE.   Please read over the following fact sheets that you were given.

## 2019-08-19 ENCOUNTER — Other Ambulatory Visit: Payer: Self-pay

## 2019-08-19 ENCOUNTER — Encounter (HOSPITAL_COMMUNITY): Payer: Self-pay

## 2019-08-19 ENCOUNTER — Encounter (HOSPITAL_COMMUNITY)
Admission: RE | Admit: 2019-08-19 | Discharge: 2019-08-19 | Disposition: A | Discharge status: Home / Self Care | Payer: Medicaid Other | Source: Ambulatory Visit | Attending: Orthopedic Surgery | Admitting: Orthopedic Surgery

## 2019-08-19 DIAGNOSIS — Z01812 Encounter for preprocedural laboratory examination: Secondary | ICD-10-CM | POA: Insufficient documentation

## 2019-08-19 LAB — BASIC METABOLIC PANEL
Anion gap: 7 (ref 5–15)
BUN: 8 mg/dL (ref 6–20)
CO2: 25 mmol/L (ref 22–32)
Calcium: 9.1 mg/dL (ref 8.9–10.3)
Chloride: 104 mmol/L (ref 98–111)
Creatinine, Ser: 0.83 mg/dL (ref 0.44–1.00)
GFR calc Af Amer: >60 mL/min (ref >60)
GFR calc non Af Amer: >60 mL/min (ref >60)
Glucose, Bld: 105 mg/dL — ABNORMAL HIGH (ref 70–99)
Potassium: 4.1 mmol/L (ref 3.5–5.1)
Sodium: 136 mmol/L (ref 135–145)

## 2019-08-19 LAB — CBC
HCT: 42.9 % (ref 36.0–46.0)
Hemoglobin: 13.6 g/dL (ref 12.0–15.0)
MCH: 32.2 pg (ref 26.0–34.0)
MCHC: 31.7 g/dL (ref 30.0–36.0)
MCV: 101.4 fL — ABNORMAL HIGH (ref 80.0–100.0)
Platelets: 379 10*3/uL (ref 150–400)
RBC: 4.23 MIL/uL (ref 3.87–5.11)
RDW: 12.7 % (ref 11.5–15.5)
WBC: 6.7 10*3/uL (ref 4.0–10.5)
nRBC: 0 % (ref 0.0–0.2)

## 2019-08-19 LAB — SURGICAL PCR SCREEN
MRSA, PCR: NEGATIVE
Staphylococcus aureus: NEGATIVE

## 2019-08-19 NOTE — Progress Notes (Signed)
PCP - Does not have one Cardiologist - Denies  Chest x-ray - Not indicated EKG - N/A Stress Test - Denies ECHO - Denies Cardiac Cath - Denies  Sleep Study - Denies  DM Denies  ERAS Protcol -Yes PRE-SURGERY Ensure given   COVID TEST- July 6    Anesthesia review: No   Patient denies shortness of breath, fever, cough and chest pain at PAT appointment   All instructions explained to the patient, with a verbal understanding of the material. Patient agrees to go over the instructions while at home for a better understanding. Patient also instructed to self quarantine after being tested for COVID-19. The opportunity to ask questions was provided.

## 2019-08-20 ENCOUNTER — Other Ambulatory Visit: Payer: Self-pay

## 2019-08-25 ENCOUNTER — Other Ambulatory Visit (HOSPITAL_COMMUNITY)
Admission: RE | Admit: 2019-08-25 | Discharge: 2019-08-25 | Disposition: A | Discharge status: Home / Self Care | Payer: 59 | Source: Ambulatory Visit | Attending: Orthopedic Surgery | Admitting: Orthopedic Surgery

## 2019-08-25 DIAGNOSIS — Z20822 Contact with and (suspected) exposure to covid-19: Secondary | ICD-10-CM | POA: Diagnosis not present

## 2019-08-25 DIAGNOSIS — Z01812 Encounter for preprocedural laboratory examination: Secondary | ICD-10-CM | POA: Diagnosis present

## 2019-08-25 LAB — SARS CORONAVIRUS 2 (TAT 6-24 HRS): SARS Coronavirus 2: NEGATIVE

## 2019-08-26 DIAGNOSIS — M79A22 Nontraumatic compartment syndrome of left lower extremity: Secondary | ICD-10-CM | POA: Diagnosis not present

## 2019-08-26 MED ORDER — VANCOMYCIN HCL 1500 MG/300ML IV SOLN
1500.0000 mg | INTRAVENOUS | Status: AC
  Administered 2019-08-27: 1500 mg via INTRAVENOUS
  Filled 2019-08-26: qty 300

## 2019-08-27 ENCOUNTER — Ambulatory Visit (HOSPITAL_COMMUNITY): Payer: 59 | Admitting: Anesthesiology

## 2019-08-27 ENCOUNTER — Ambulatory Visit (HOSPITAL_COMMUNITY)
Admission: RE | Admit: 2019-08-27 | Discharge: 2019-08-27 | Disposition: A | Discharge status: Home / Self Care | Payer: 59 | Attending: Orthopedic Surgery | Admitting: Orthopedic Surgery

## 2019-08-27 ENCOUNTER — Other Ambulatory Visit: Payer: Self-pay

## 2019-08-27 ENCOUNTER — Encounter (HOSPITAL_COMMUNITY)
Admission: RE | Disposition: A | Discharge status: Home / Self Care | Payer: Self-pay | Source: Home / Self Care | Attending: Orthopedic Surgery

## 2019-08-27 ENCOUNTER — Encounter (HOSPITAL_COMMUNITY): Payer: Self-pay | Admitting: Orthopedic Surgery

## 2019-08-27 DIAGNOSIS — Z833 Family history of diabetes mellitus: Secondary | ICD-10-CM | POA: Diagnosis not present

## 2019-08-27 DIAGNOSIS — Y939 Activity, unspecified: Secondary | ICD-10-CM | POA: Insufficient documentation

## 2019-08-27 DIAGNOSIS — M79A22 Nontraumatic compartment syndrome of left lower extremity: Secondary | ICD-10-CM

## 2019-08-27 DIAGNOSIS — Z8349 Family history of other endocrine, nutritional and metabolic diseases: Secondary | ICD-10-CM | POA: Diagnosis not present

## 2019-08-27 DIAGNOSIS — Z886 Allergy status to analgesic agent status: Secondary | ICD-10-CM | POA: Insufficient documentation

## 2019-08-27 DIAGNOSIS — F17210 Nicotine dependence, cigarettes, uncomplicated: Secondary | ICD-10-CM | POA: Diagnosis not present

## 2019-08-27 DIAGNOSIS — Z8049 Family history of malignant neoplasm of other genital organs: Secondary | ICD-10-CM | POA: Insufficient documentation

## 2019-08-27 DIAGNOSIS — Z803 Family history of malignant neoplasm of breast: Secondary | ICD-10-CM | POA: Insufficient documentation

## 2019-08-27 DIAGNOSIS — Z6841 Body Mass Index (BMI) 40.0 and over, adult: Secondary | ICD-10-CM | POA: Diagnosis not present

## 2019-08-27 DIAGNOSIS — T79A22A Traumatic compartment syndrome of left lower extremity, initial encounter: Secondary | ICD-10-CM | POA: Insufficient documentation

## 2019-08-27 DIAGNOSIS — Z825 Family history of asthma and other chronic lower respiratory diseases: Secondary | ICD-10-CM | POA: Diagnosis not present

## 2019-08-27 DIAGNOSIS — Z823 Family history of stroke: Secondary | ICD-10-CM | POA: Diagnosis not present

## 2019-08-27 DIAGNOSIS — X58XXXA Exposure to other specified factors, initial encounter: Secondary | ICD-10-CM | POA: Insufficient documentation

## 2019-08-27 DIAGNOSIS — Z8249 Family history of ischemic heart disease and other diseases of the circulatory system: Secondary | ICD-10-CM | POA: Diagnosis not present

## 2019-08-27 DIAGNOSIS — Z79899 Other long term (current) drug therapy: Secondary | ICD-10-CM | POA: Insufficient documentation

## 2019-08-27 DIAGNOSIS — Z880 Allergy status to penicillin: Secondary | ICD-10-CM | POA: Diagnosis not present

## 2019-08-27 HISTORY — PX: FASCIOTOMY: SHX132

## 2019-08-27 LAB — POCT PREGNANCY, URINE: Preg Test, Ur: NEGATIVE

## 2019-08-27 SURGERY — FASCIOTOMY, UPPER EXTREMITY
Anesthesia: General | Site: Leg Lower | Laterality: Left

## 2019-08-27 MED ORDER — POVIDONE-IODINE 7.5 % EX SOLN
Freq: Once | CUTANEOUS | Status: DC
  Filled 2019-08-27: qty 118

## 2019-08-27 MED ORDER — FENTANYL CITRATE (PF) 250 MCG/5ML IJ SOLN
INTRAMUSCULAR | Status: AC
  Filled 2019-08-27: qty 5

## 2019-08-27 MED ORDER — MIDAZOLAM HCL 2 MG/2ML IJ SOLN
INTRAMUSCULAR | Status: AC
  Filled 2019-08-27: qty 2

## 2019-08-27 MED ORDER — MIDAZOLAM HCL 5 MG/5ML IJ SOLN
INTRAMUSCULAR | Status: DC | PRN
  Administered 2019-08-27: 2 mg via INTRAVENOUS

## 2019-08-27 MED ORDER — LIDOCAINE 2% (20 MG/ML) 5 ML SYRINGE
INTRAMUSCULAR | Status: AC
  Filled 2019-08-27: qty 5

## 2019-08-27 MED ORDER — OXYCODONE HCL 5 MG PO TABS
ORAL_TABLET | ORAL | Status: AC
  Filled 2019-08-27: qty 1

## 2019-08-27 MED ORDER — MORPHINE SULFATE (PF) 4 MG/ML IV SOLN
INTRAVENOUS | Status: DC | PRN
  Administered 2019-08-27: 4 mg via INTRAMUSCULAR

## 2019-08-27 MED ORDER — PROPOFOL 10 MG/ML IV BOLUS
INTRAVENOUS | Status: AC
  Filled 2019-08-27: qty 40

## 2019-08-27 MED ORDER — CHLORHEXIDINE GLUCONATE 0.12 % MT SOLN
15.0000 mL | Freq: Once | OROMUCOSAL | Status: AC
  Administered 2019-08-27: 15 mL via OROMUCOSAL
  Filled 2019-08-27: qty 15

## 2019-08-27 MED ORDER — ONDANSETRON HCL 4 MG/2ML IJ SOLN
INTRAMUSCULAR | Status: DC | PRN
  Administered 2019-08-27: 4 mg via INTRAVENOUS

## 2019-08-27 MED ORDER — ORAL CARE MOUTH RINSE: 15.0000 mL | Freq: Once | OROMUCOSAL | Status: AC

## 2019-08-27 MED ORDER — FENTANYL CITRATE (PF) 100 MCG/2ML IJ SOLN
INTRAMUSCULAR | Status: DC | PRN
  Administered 2019-08-27: 50 ug via INTRAVENOUS

## 2019-08-27 MED ORDER — LACTATED RINGERS IV SOLN: INTRAVENOUS | Status: DC | PRN

## 2019-08-27 MED ORDER — BUPIVACAINE HCL (PF) 0.5 % IJ SOLN
INTRAMUSCULAR | Status: DC | PRN
  Administered 2019-08-27: 30 mL

## 2019-08-27 MED ORDER — MORPHINE SULFATE (PF) 4 MG/ML IV SOLN
INTRAVENOUS | Status: AC
  Filled 2019-08-27: qty 1

## 2019-08-27 MED ORDER — ONDANSETRON HCL 4 MG/2ML IJ SOLN: 4.0000 mg | Freq: Four times a day (QID) | INTRAMUSCULAR | Status: DC | PRN

## 2019-08-27 MED ORDER — OXYCODONE HCL 5 MG/5ML PO SOLN: 5.0000 mg | Freq: Once | ORAL | Status: AC | PRN

## 2019-08-27 MED ORDER — PROPOFOL 10 MG/ML IV BOLUS
INTRAVENOUS | Status: DC | PRN
  Administered 2019-08-27: 200 mg via INTRAVENOUS

## 2019-08-27 MED ORDER — OXYCODONE HCL 5 MG PO TABS
5.0000 mg | ORAL_TABLET | Freq: Once | ORAL | Status: AC | PRN
  Administered 2019-08-27: 5 mg via ORAL

## 2019-08-27 MED ORDER — FENTANYL CITRATE (PF) 100 MCG/2ML IJ SOLN
INTRAMUSCULAR | Status: AC
  Filled 2019-08-27: qty 2

## 2019-08-27 MED ORDER — HYDROCODONE-ACETAMINOPHEN 10-325 MG PO TABS: 1.0000 | ORAL_TABLET | ORAL | 0 refills | Status: DC | PRN

## 2019-08-27 MED ORDER — LIDOCAINE HCL (CARDIAC) PF 100 MG/5ML IV SOSY
PREFILLED_SYRINGE | INTRAVENOUS | Status: DC | PRN
  Administered 2019-08-27: 30 mg via INTRAVENOUS

## 2019-08-27 MED ORDER — POVIDONE-IODINE 10 % EX SWAB: 2.0000 "application " | Freq: Once | CUTANEOUS | Status: DC

## 2019-08-27 MED ORDER — METHOCARBAMOL 500 MG PO TABS: 500.0000 mg | ORAL_TABLET | Freq: Three times a day (TID) | ORAL | 0 refills | Status: DC | PRN

## 2019-08-27 MED ORDER — BUPIVACAINE HCL (PF) 0.5 % IJ SOLN
INTRAMUSCULAR | Status: AC
  Filled 2019-08-27: qty 30

## 2019-08-27 MED ORDER — CLONIDINE HCL (ANALGESIA) 100 MCG/ML EP SOLN
EPIDURAL | Status: DC | PRN
  Administered 2019-08-27: 1 mL

## 2019-08-27 MED ORDER — 0.9 % SODIUM CHLORIDE (POUR BTL) OPTIME
TOPICAL | Status: DC | PRN
  Administered 2019-08-27: 1000 mL

## 2019-08-27 MED ORDER — FENTANYL CITRATE (PF) 100 MCG/2ML IJ SOLN
25.0000 ug | INTRAMUSCULAR | Status: DC | PRN
  Administered 2019-08-27: 50 ug via INTRAVENOUS
  Administered 2019-08-27: 25 ug via INTRAVENOUS

## 2019-08-27 MED ORDER — LACTATED RINGERS IV SOLN: INTRAVENOUS | Status: DC

## 2019-08-27 MED ORDER — RIVAROXABAN 10 MG PO TABS
10.0000 mg | ORAL_TABLET | Freq: Every day | ORAL | 0 refills | Status: DC
Start: 2019-08-27 — End: 2019-08-27

## 2019-08-27 MED ORDER — ONDANSETRON HCL 4 MG/2ML IJ SOLN
INTRAMUSCULAR | Status: AC
  Filled 2019-08-27: qty 2

## 2019-08-27 MED ORDER — CLONIDINE HCL (ANALGESIA) 100 MCG/ML EP SOLN
EPIDURAL | Status: AC
  Filled 2019-08-27: qty 10

## 2019-08-27 SURGICAL SUPPLY — 66 items
BNDG COHESIVE 4X5 TAN STRL (GAUZE/BANDAGES/DRESSINGS) ×3 IMPLANT
BNDG ELASTIC 4X5.8 VLCR STR LF (GAUZE/BANDAGES/DRESSINGS) ×3 IMPLANT
BNDG ELASTIC 6X5.8 VLCR STR LF (GAUZE/BANDAGES/DRESSINGS) ×5 IMPLANT
BNDG GAUZE ELAST 4 BULKY (GAUZE/BANDAGES/DRESSINGS) ×3 IMPLANT
BRUSH SCRUB EZ PLAIN DRY (MISCELLANEOUS) ×6 IMPLANT
CANISTER WOUND CARE 500ML ATS (WOUND CARE) IMPLANT
CLOSURE STERI-STRIP 1/2X4 (GAUZE/BANDAGES/DRESSINGS) ×2
CLSR STERI-STRIP ANTIMIC 1/2X4 (GAUZE/BANDAGES/DRESSINGS) ×2 IMPLANT
COVER SURGICAL LIGHT HANDLE (MISCELLANEOUS) ×4 IMPLANT
COVER WAND RF STERILE (DRAPES) ×3 IMPLANT
CUFF TOURN SGL QUICK 24 (TOURNIQUET CUFF)
CUFF TOURN SGL QUICK 34 (TOURNIQUET CUFF)
CUFF TRNQT CYL 24X4X16.5-23 (TOURNIQUET CUFF) IMPLANT
CUFF TRNQT CYL 34X4.125X (TOURNIQUET CUFF) IMPLANT
DRAPE C-ARMOR (DRAPES) ×1 IMPLANT
DRAPE INCISE IOBAN 66X45 STRL (DRAPES) ×5 IMPLANT
DRAPE ORTHO SPLIT 77X108 STRL (DRAPES) ×6
DRAPE SURG ORHT 6 SPLT 77X108 (DRAPES) ×2 IMPLANT
DRAPE U-SHAPE 47X51 STRL (DRAPES) ×3 IMPLANT
DRSG ADAPTIC 3X8 NADH LF (GAUZE/BANDAGES/DRESSINGS) ×3 IMPLANT
DRSG AQUACEL AG ADV 3.5X14 (GAUZE/BANDAGES/DRESSINGS) ×2 IMPLANT
DRSG PAD ABDOMINAL 8X10 ST (GAUZE/BANDAGES/DRESSINGS) ×3 IMPLANT
DRSG VAC ATS LRG SENSATRAC (GAUZE/BANDAGES/DRESSINGS) IMPLANT
DRSG VAC ATS MED SENSATRAC (GAUZE/BANDAGES/DRESSINGS) IMPLANT
DRSG VAC ATS SM SENSATRAC (GAUZE/BANDAGES/DRESSINGS) IMPLANT
ELECT REM PT RETURN 9FT ADLT (ELECTROSURGICAL) ×3
ELECTRODE REM PT RTRN 9FT ADLT (ELECTROSURGICAL) ×1 IMPLANT
GAUZE SPONGE 4X4 12PLY STRL (GAUZE/BANDAGES/DRESSINGS) ×3 IMPLANT
GLOVE BIO SURGEON STRL SZ7.5 (GLOVE) ×1 IMPLANT
GLOVE BIO SURGEON STRL SZ8 (GLOVE) ×3 IMPLANT
GLOVE BIOGEL PI IND STRL 6.5 (GLOVE) IMPLANT
GLOVE BIOGEL PI IND STRL 7.5 (GLOVE) ×1 IMPLANT
GLOVE BIOGEL PI IND STRL 8 (GLOVE) ×1 IMPLANT
GLOVE BIOGEL PI INDICATOR 6.5 (GLOVE) ×2
GLOVE BIOGEL PI INDICATOR 7.5 (GLOVE)
GLOVE BIOGEL PI INDICATOR 8 (GLOVE) ×2
GOWN STRL REUS W/ TWL LRG LVL3 (GOWN DISPOSABLE) ×2 IMPLANT
GOWN STRL REUS W/ TWL XL LVL3 (GOWN DISPOSABLE) ×1 IMPLANT
GOWN STRL REUS W/TWL LRG LVL3 (GOWN DISPOSABLE) ×6
GOWN STRL REUS W/TWL XL LVL3 (GOWN DISPOSABLE) ×3
KIT BASIN OR (CUSTOM PROCEDURE TRAY) ×3 IMPLANT
KIT TURNOVER KIT B (KITS) ×3 IMPLANT
MANIFOLD NEPTUNE II (INSTRUMENTS) ×3 IMPLANT
NEEDLE 22X1 1/2 (OR ONLY) (NEEDLE) ×2 IMPLANT
NS IRRIG 1000ML POUR BTL (IV SOLUTION) ×3 IMPLANT
PACK ORTHO EXTREMITY (CUSTOM PROCEDURE TRAY) ×3 IMPLANT
PAD ARMBOARD 7.5X6 YLW CONV (MISCELLANEOUS) ×6 IMPLANT
SET MONITOR QUICK PRESSURE (MISCELLANEOUS) IMPLANT
SPONGE LAP 18X18 RF (DISPOSABLE) ×3 IMPLANT
STAPLER VISISTAT 35W (STAPLE) IMPLANT
STOCKINETTE IMPERVIOUS 9X36 MD (GAUZE/BANDAGES/DRESSINGS) ×3 IMPLANT
SUT MON AB 3-0 SH 27 (SUTURE) ×3
SUT MON AB 3-0 SH27 (SUTURE) IMPLANT
SUT VIC AB 0 CT1 27 (SUTURE) ×9
SUT VIC AB 0 CT1 27XBRD ANBCTR (SUTURE) IMPLANT
SUT VIC AB 2-0 CT1 27 (SUTURE) ×15
SUT VIC AB 2-0 CT1 TAPERPNT 27 (SUTURE) IMPLANT
SUT VIC AB 2-0 CT3 27 (SUTURE) IMPLANT
SUT VIC AB 2-0 CTB1 (SUTURE) IMPLANT
SYR CONTROL 10ML LL (SYRINGE) ×2 IMPLANT
TOWEL GREEN STERILE (TOWEL DISPOSABLE) ×6 IMPLANT
TOWEL GREEN STERILE FF (TOWEL DISPOSABLE) ×3 IMPLANT
TUBE CONNECTING 12'X1/4 (SUCTIONS) ×1
TUBE CONNECTING 12X1/4 (SUCTIONS) ×1 IMPLANT
WATER STERILE IRR 1000ML POUR (IV SOLUTION) ×3 IMPLANT
YANKAUER SUCT BULB TIP NO VENT (SUCTIONS) ×2 IMPLANT

## 2019-08-27 NOTE — Progress Notes (Signed)
Wasted .46mL of Fentanyl with Leslie Dales, RN in the stericycle at this time.  Desmond Lope

## 2019-08-27 NOTE — Op Note (Signed)
NAME: ATAYA, MURDY MEDICAL RECORD ZH:[id number] ACCOUNT [id number] DATE OF BIRTH:October 27, 1985 FACILITY: [facility] LOCATION: [facility] PHYSICIAN:Alecxander Mainwaring Diamantina Providence, MD  OPERATIVE REPORT  DATE OF PROCEDURE:  08/27/2019  PREOPERATIVE DIAGNOSIS:  Exertional compartment syndrome, left leg.  POSTOPERATIVE DIAGNOSIS:  Exertional compartment syndrome, left leg.  PROCEDURE:  Release of anterior and lateral compartment.  SURGEON:  Cammy Copa, MD  ASSISTANT:  Karenann Cai, PA  INDICATIONS:  The patient is a 34 year old patient with documented exertional compartment syndrome who presents for operative management after failure of conservative management and explanation of risks and benefits.  PROCEDURE IN DETAIL:  The patient was brought to the operating room where general anesthetic was induced.  Preoperative antibiotics administered.  Timeout was called.  The left leg was pre-scrubbed with alcohol and Betadine, allowed to air dry, prepped  with DuraPrep solution and draped in a sterile manner.  Ioban used to cover the operative field.  Tourniquet was not utilized.  Incision was made approximately 14 cm in the midportion between the anterior and lateral compartment.  Skin and subcutaneous  tissue were sharply divided.  Full-thickness skin flaps were developed off of the fascia.  The lateral compartment fascia was identified.  Superficial peroneal nerve was also identified.  The fascia was released proximally from the fibula head distally  down to its exiting site under direct visualization about a handbreadth above the lateral malleolus.  Superficial peroneal nerve was identified and completely decompressed, particularly distally.  Next, the anterior compartment was incised and the fascia  was also divided from the level of the fibula head distally to about a handbreadth above the lateral malleolus.  The muscle was completely viable and intact.  Thorough irrigation was then performed.  The skin  edges were anesthetized using Marcaine,  morphine, clonidine.  Skin was then closed using a combination of 0 Vicryl suture, 2-0 Vicryl suture and a 3-0 Monocryl.  Steri-Strips and Aquacel dressing applied, along with an Ace wrap.  The patient will be weightbearing as tolerated.  She will follow  up in 1 week.  Luke's assistance was required at all times for opening and closing, tissue mobilization.  His assistance was a medical necessity.  VN/NUANCE  D:08/27/2019 T:08/27/2019 JOB:[id number]

## 2019-08-27 NOTE — Brief Op Note (Signed)
   08/27/2019  7:50 PM  PATIENT:  Kristin Wright  34 y.o. female  PRE-OPERATIVE DIAGNOSIS:  left leg exertional compartment syndrome  POST-OPERATIVE DIAGNOSIS:  left leg exertional compartment syndrome  PROCEDURE:  Procedure(s): left leg anterior/lateral compartmental release  SURGEON:  Surgeon(s): August Saucer, Corrie Mckusick, MD  ASSISTANT: Karenann Cai, PA  ANESTHESIA:   general  EBL: 25 ml    No intake/output data recorded.  BLOOD ADMINISTERED: none  DRAINS: none   LOCAL MEDICATIONS USED: Marcaine morphine clonidine  SPECIMEN:  No Specimen  COUNTS:  YES  TOURNIQUET:  * Missing tourniquet times found for documented tourniquets in log: [id number] *  DICTATION: .Other Dictation: Dictation Number [id number]  PLAN OF CARE: Discharge to home after PACU  PATIENT DISPOSITION:  PACU - hemodynamically stable

## 2019-08-27 NOTE — Progress Notes (Signed)
Orthopedic Tech Progress Note Patient Details:  Kristin Wright Apr 12, 1985 [id number]  Ortho Devices Type of Ortho Device: Crutches Ortho Device/Splint Interventions: Ordered, Adjustment   Post Interventions Patient Tolerated: Well Instructions Provided: Adjustment of device, Care of device, Poper ambulation with device   Dahlia Nifong [name] 08/27/2019, 4:12 PM

## 2019-08-27 NOTE — Anesthesia Preprocedure Evaluation (Signed)
Anesthesia Evaluation  Patient identified by MRN, date of birth, ID band Patient awake    Reviewed: Allergy & Precautions, H&P , NPO status , Patient's Chart, lab work & pertinent test results  Airway Mallampati: II   Neck ROM: full    Dental   Pulmonary neg pulmonary ROS, Current Smoker,    breath sounds clear to auscultation       Cardiovascular negative cardio ROS   Rhythm:regular Rate:Normal     Neuro/Psych    GI/Hepatic   Endo/Other  Morbid obesity  Renal/GU      Musculoskeletal   Abdominal   Peds  Hematology   Anesthesia Other Findings   Reproductive/Obstetrics                             Anesthesia Physical Anesthesia Plan  ASA: II  Anesthesia Plan: General   Post-op Pain Management:    Induction: Intravenous  PONV Risk Score and Plan: 2 and Ondansetron, Dexamethasone, Midazolam and Treatment may vary due to age or medical condition  Airway Management Planned: LMA  Additional Equipment:   Intra-op Plan:   Post-operative Plan: Extubation in OR  Informed Consent: I have reviewed the patients History and Physical, chart, labs and discussed the procedure including the risks, benefits and alternatives for the proposed anesthesia with the patient or authorized representative who has indicated his/her understanding and acceptance.       Plan Discussed with: CRNA, Anesthesiologist and Surgeon  Anesthesia Plan Comments:         Anesthesia Quick Evaluation

## 2019-08-27 NOTE — Transfer of Care (Signed)
Immediate Anesthesia Transfer of Care Note  Patient: Kristin Wright  Procedure(s) Performed: left leg anterior/lateral compartmental release (Left Leg Lower)  Patient Location: PACU  Anesthesia Type:General  Level of Consciousness: awake, alert  and oriented  Airway & Oxygen Therapy: Patient Spontanous Breathing and Patient connected to nasal cannula oxygen  Post-op Assessment: Report given to RN and Post -op Vital signs reviewed and stable  Post vital signs: Reviewed and stable  Last Vitals:  Vitals Value Taken Time  BP 156/83 08/27/19 1421  Temp 36.1 C 08/27/19 1420  Pulse 84 08/27/19 1428  Resp 27 08/27/19 1428  SpO2 98 % 08/27/19 1428  Vitals shown include unvalidated device data.  Last Pain:  Vitals:   08/27/19 1420  TempSrc:   PainSc: 0-No pain      Patients Stated Pain Goal: 4 (08/27/19 1110)  Complications: No complications documented.

## 2019-08-27 NOTE — Anesthesia Procedure Notes (Signed)
Procedure Name: LMA Insertion Date/Time: 08/27/2019 12:45 PM Performed by: Gwenyth Allegra, CRNA Pre-anesthesia Checklist: Patient identified, Emergency Drugs available, Suction available, Patient being monitored and Timeout performed Patient Re-evaluated:Patient Re-evaluated prior to induction Oxygen Delivery Method: Circle system utilized Preoxygenation: Pre-oxygenation with 100% oxygen Induction Type: IV induction LMA: LMA inserted LMA Size: 4.0 Placement Confirmation: positive ETCO2 and breath sounds checked- equal and bilateral Tube secured with: Tape Dental Injury: Teeth and Oropharynx as per pre-operative assessment
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
## 2019-08-28 ENCOUNTER — Encounter (HOSPITAL_COMMUNITY): Payer: Self-pay | Admitting: Orthopedic Surgery

## 2019-08-28 NOTE — Anesthesia Postprocedure Evaluation (Signed)
Anesthesia Post Note  Patient: Kiaja T Streck  Procedure(s) Performed: left leg anterior/lateral compartmental release (Left Leg Lower)     Patient location during evaluation: PACU Anesthesia Type: General Level of consciousness: awake and alert Pain management: pain level controlled Vital Signs Assessment: post-procedure vital signs reviewed and stable Respiratory status: spontaneous breathing, nonlabored ventilation, respiratory function stable and patient connected to nasal cannula oxygen Cardiovascular status: blood pressure returned to baseline and stable Postop Assessment: no apparent nausea or vomiting Anesthetic complications: no   No complications documented.  Last Vitals:  Vitals:   08/27/19 1450 08/27/19 1501  BP: (!) 159/98 (!) 160/94  Pulse: 70 76  Resp: 18 13  Temp:  (!) 36.2 C  SpO2: 99% 98%    Last Pain:  Vitals:   08/27/19 1451  TempSrc:   PainSc: 6                  Pallavi Clifton S

## 2019-08-29 NOTE — Addendum Note (Signed)
Addendum  created 08/29/19 1057 by Achille Rich, MD   Intraprocedure Staff edited

## 2019-09-03 ENCOUNTER — Ambulatory Visit (INDEPENDENT_AMBULATORY_CARE_PROVIDER_SITE_OTHER): Payer: 59 | Admitting: Orthopedic Surgery

## 2019-09-03 DIAGNOSIS — M79A22 Nontraumatic compartment syndrome of left lower extremity: Secondary | ICD-10-CM

## 2019-09-05 ENCOUNTER — Encounter: Payer: Self-pay | Admitting: Orthopedic Surgery
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
## 2019-09-11 ENCOUNTER — Ambulatory Visit (INDEPENDENT_AMBULATORY_CARE_PROVIDER_SITE_OTHER): Payer: 59 | Admitting: Surgical

## 2019-09-11 DIAGNOSIS — M79A22 Nontraumatic compartment syndrome of left lower extremity: Secondary | ICD-10-CM

## 2019-09-11 MED ORDER — SULFAMETHOXAZOLE-TRIMETHOPRIM 400-80 MG PO TABS: 1.0000 | ORAL_TABLET | Freq: Two times a day (BID) | ORAL | 0 refills | Status: DC

## 2019-09-13 ENCOUNTER — Encounter: Payer: Self-pay | Admitting: Surgical
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
## 2019-09-18 ENCOUNTER — Ambulatory Visit: Payer: 59 | Admitting: Surgical

## 2019-09-30 ENCOUNTER — Ambulatory Visit (INDEPENDENT_AMBULATORY_CARE_PROVIDER_SITE_OTHER): Payer: 59 | Admitting: Orthopedic Surgery

## 2019-09-30 DIAGNOSIS — M79A22 Nontraumatic compartment syndrome of left lower extremity: Secondary | ICD-10-CM

## 2019-10-01 ENCOUNTER — Encounter: Payer: Self-pay | Admitting: Orthopedic Surgery
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
## 2019-10-08 ENCOUNTER — Telehealth: Payer: Self-pay | Admitting: Orthopedic Surgery

## 2019-10-08 NOTE — Telephone Encounter (Signed)
Records 07/10/2019- present faxed to [facility], Attn: Shanda Bumps fax [phone], ph [phone]

## 2019-11-20 ENCOUNTER — Ambulatory Visit: Payer: 59 | Admitting: Surgical

## 2019-11-23 ENCOUNTER — Encounter: Payer: Self-pay | Admitting: Orthopedic Surgery

## 2019-11-23 ENCOUNTER — Ambulatory Visit (INDEPENDENT_AMBULATORY_CARE_PROVIDER_SITE_OTHER): Payer: 59 | Admitting: Orthopedic Surgery

## 2019-11-23 ENCOUNTER — Telehealth: Payer: Self-pay

## 2019-11-23 DIAGNOSIS — M79A22 Nontraumatic compartment syndrome of left lower extremity: Secondary | ICD-10-CM

## 2019-11-23 NOTE — Progress Notes (Signed)
   Post-Op Visit Note   Patient: Kristin Wright           Date of Birth: [birth year]           MRN: [id number] Visit Date: 11/23/2019 PCP: Patient, No Pcp Per   Assessment & Plan:  Chief Complaint:  Chief Complaint  Patient presents with  . c/o leg drainage   Visit Diagnoses:  1. 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  She has had some problems with her incision healing.  On exam today she has 2 areas about the size of a quarter which have some granulation tissue and have some dehiscence but it is trying to heal from the inside out.  No induration fluctuance or erythema.  Talk with Dr. Lajoyce Corners about this problem and working to try her with some compression socks.  This should take about 3 months to heal.  See her back in 6 weeks.  I do want her to wear those compression socks all of the time.  She is walking by 2500 steps a day.  Follow-Up Instructions: Return in about 6 weeks (around 01/04/2020).   Orders:  No orders of the defined types were placed in this encounter.  No orders of the defined types were placed in this encounter.   Imaging: No results found.  PMFS History: There are no problems to display for this patient.  Past Medical History:  Diagnosis Date  . Fainting 2004   triggered by bright lights    Family History  Problem Relation Age of Onset  . Breast cancer Maternal Grandmother   . Cervical cancer Maternal Grandmother   . Hypertension Maternal Grandmother   . Diabetes Maternal Grandmother   . Diabetes Maternal Grandfather   . Heart disease Maternal Grandfather   . Hyperlipidemia Maternal Grandfather   . Hypertension Mother   . Diabetes Mother   . Hyperlipidemia Mother   . Stroke Mother   . Asthma Father     Past Surgical History:  Procedure Laterality Date  . FASCIOTOMY Left 08/27/2019   Procedure: left leg  anterior/lateral compartmental release;  Surgeon: Cammy Copa, MD;  Location: [facility] OR;  Service: Orthopedics;  Laterality: Left;   Social History   Occupational History  . Not on file  Tobacco Use  . Smoking status: Current Every Day Smoker    Packs/day: 0.10    Years: 20.00    Pack years: 2.00    Types: Cigarettes  . Smokeless tobacco: Never Used  Vaping Use  . Vaping Use: Never used  Substance and Sexual Activity  . Alcohol use: No  . Drug use: Yes    Frequency: 7.0 times per week    Types: Marijuana  . Sexual activity: Yes    Birth control/protection: Implant

## 2019-11-23 NOTE — Telephone Encounter (Signed)
Patient coming into office concerning incision on left leg.

## 2020-01-04 ENCOUNTER — Ambulatory Visit: Payer: 59 | Admitting: Orthopedic Surgery

## 2020-01-20 ENCOUNTER — Ambulatory Visit: Payer: 59 | Admitting: Orthopedic Surgery

## 2020-08-03 ENCOUNTER — Other Ambulatory Visit: Payer: Self-pay

## 2020-08-03 ENCOUNTER — Encounter (HOSPITAL_COMMUNITY): Payer: Self-pay

## 2020-08-03 ENCOUNTER — Ambulatory Visit (HOSPITAL_COMMUNITY)
Admission: EM | Admit: 2020-08-03 | Discharge: 2020-08-03 | Disposition: A | Discharge status: Home / Self Care | Payer: 59 | Attending: Internal Medicine | Admitting: Internal Medicine

## 2020-08-03 DIAGNOSIS — R079 Chest pain, unspecified: Secondary | ICD-10-CM | POA: Diagnosis present

## 2020-08-03 DIAGNOSIS — R0600 Dyspnea, unspecified: Secondary | ICD-10-CM | POA: Insufficient documentation

## 2020-08-03 LAB — CBC
HCT: 42.4 % (ref 36.0–46.0)
Hemoglobin: 13.8 g/dL (ref 12.0–15.0)
MCH: 32.7 pg (ref 26.0–34.0)
MCHC: 32.5 g/dL (ref 30.0–36.0)
MCV: 100.5 fL — ABNORMAL HIGH (ref 80.0–100.0)
Platelets: 400 10*3/uL (ref 150–400)
RBC: 4.22 MIL/uL (ref 3.87–5.11)
RDW: 12.6 % (ref 11.5–15.5)
WBC: 9.6 10*3/uL (ref 4.0–10.5)
nRBC: 0 % (ref 0.0–0.2)

## 2020-08-03 LAB — BASIC METABOLIC PANEL
Anion gap: 8 (ref 5–15)
BUN: 8 mg/dL (ref 6–20)
CO2: 25 mmol/L (ref 22–32)
Calcium: 9.1 mg/dL (ref 8.9–10.3)
Chloride: 102 mmol/L (ref 98–111)
Creatinine, Ser: 0.81 mg/dL (ref 0.44–1.00)
GFR, Estimated: >60 mL/min (ref >60)
Glucose, Bld: 84 mg/dL (ref 70–99)
Potassium: 3.9 mmol/L (ref 3.5–5.1)
Sodium: 135 mmol/L (ref 135–145)

## 2020-08-03 MED ORDER — LIDOCAINE VISCOUS HCL 2 % MT SOLN
15.0000 mL | Freq: Once | OROMUCOSAL | Status: AC
  Administered 2020-08-03: 15 mL via ORAL

## 2020-08-03 MED ORDER — ALUM & MAG HYDROXIDE-SIMETH 200-200-20 MG/5ML PO SUSP
ORAL | Status: AC
  Filled 2020-08-03: qty 30

## 2020-08-03 MED ORDER — ALUM & MAG HYDROXIDE-SIMETH 200-200-20 MG/5ML PO SUSP
30.0000 mL | Freq: Once | ORAL | Status: AC
  Administered 2020-08-03: 30 mL via ORAL

## 2020-08-03 MED ORDER — OMEPRAZOLE 20 MG PO CPDR
20.0000 mg | DELAYED_RELEASE_CAPSULE | Freq: Every day | ORAL | 0 refills | Status: DC
Start: 2020-08-03 — End: 2021-08-17

## 2020-08-03 MED ORDER — LIDOCAINE VISCOUS HCL 2 % MT SOLN
OROMUCOSAL | Status: AC
  Filled 2020-08-03: qty 15

## 2020-08-03 NOTE — ED Triage Notes (Addendum)
Pt reports intermittent migraines and bad chest pains for the past week. Reports last night noticed feeling of airway closing when laying down and had to prop self up in bed. Feels hard to swallow, feels that throat is partially closed.  States started vomiting at midnight. States her mother called EMS this morning due to pt having trouble breathing. Pt reports was sweating a lot but felt improved after being put on oxygen and chose not to go to the hospital. Pt does not feel diaphoretic at this time, sat 100% on room air. Does not appear to be in any respiratory distress but reports shortness of breath and feels that throat is closing.  Of note pt reports 2 falls last month related to dizziness/saw black spots in vision.

## 2020-08-03 NOTE — ED Provider Notes (Signed)
[facility]    CSN: [id number] Arrival date & time: 08/03/20  0855      History   Chief Complaint Chief Complaint  Patient presents with   Headache   Chest Pain   Emesis   Shortness of Breath   Oral Swelling    HPI Kristin Wright is a 35 y.o. female.   HPI  Chest Pains: Patient states that she was awoken this morning by chest pain that was sternal in nature radiated up towards her head.  She also felt short of breath and weak.  She also had a headache.  She states that her mom called EMS and she was placed on oxygen which made her feel much better.  She declined going to the ER but presents here today.  She states that she has had a few episodes over the past months where she has had dizziness and near syncope. Currently symptoms have improved. Chest pain is sometimes worse with coughing but not necessarily with food.  No current shortness of breath, wheezing, fevers, cold symptoms, dizziness.   Past Medical History:  Diagnosis Date   Fainting 2004   triggered by bright lights    There are no problems to display for this patient.   Past Surgical History:  Procedure Laterality Date   FASCIOTOMY Left 08/27/2019   Procedure: left leg anterior/lateral compartmental release;  Surgeon: Cammy Copa, MD;  Location: [facility] OR;  Service: Orthopedics;  Laterality: Left;    OB History     Gravida  4   Para  1   Term  1   Preterm      AB  3   Living  1      SAB  1   IAB  1   Ectopic  1   Multiple      Live Births  1            Home Medications    Prior to Admission medications   Medication Sig Start Date End Date Taking? Authorizing Provider  HYDROcodone-acetaminophen (NORCO) 10-325 MG tablet Take 1 tablet by mouth every 4 (four) hours as needed. 08/27/19   Magnant, Charles L, PA-C  methocarbamol (ROBAXIN) 500 MG tablet Take 1 tablet (500 mg total) by mouth every 8 (eight) hours as needed. 08/27/19   Magnant, Charles L, PA-C   sulfamethoxazole-trimethoprim (BACTRIM) 400-80 MG tablet Take 1 tablet by mouth 2 (two) times daily. 09/11/19   Magnant, Joycie Peek, PA-C  omeprazole (PRILOSEC) 40 MG capsule Take 1 capsule (40 mg total) by mouth 2 (two) times daily. 08/17/16 01/19/19  Dorena Bodo, NP    Family History Family History  Problem Relation Age of Onset   Hypertension Mother    Diabetes Mother    Hyperlipidemia Mother    Stroke Mother    Other Mother    Heart disease Mother    Asthma Father    Heart disease Maternal Grandmother    Breast cancer Maternal Grandmother    Cervical cancer Maternal Grandmother    Hypertension Maternal Grandmother    Diabetes Maternal Grandmother    Diabetes Maternal Grandfather    Heart disease Maternal Grandfather    Hyperlipidemia Maternal Grandfather     Social History Social History   Tobacco Use   Smoking status: Every Day    Years: 20.00    Pack years: 0.00    Types: Cigarettes   Smokeless tobacco: Never   Tobacco comments:    1 pack of cigarettes every  2 weeks  Vaping Use   Vaping Use: Never used  Substance Use Topics   Alcohol use: No   Drug use: Yes    Frequency: 7.0 times per week    Types: Marijuana     Allergies   Aspirin and Penicillins   Review of Systems Review of Systems  As stated above in HPI Physical Exam Triage Vital Signs ED Triage Vitals [08/03/20 0939]  Enc Vitals Group     BP (!) 142/93     Pulse Rate 77     Resp (!) 22     Temp 98.6 F (37 C)     Temp src      SpO2 99 %     Weight      Height      Head Circumference      Peak Flow      Pain Score 9     Pain Loc      Pain Edu?      Excl. in GC?    No data found.  Updated Vital Signs BP (!) 142/93   Pulse 77   Temp 98.6 F (37 C)   Resp (!) 22   SpO2 99%   Physical Exam Vitals and nursing note reviewed.  Constitutional:      General: She is not in acute distress.    Appearance: She is well-developed. She is obese. She is not ill-appearing,  toxic-appearing or diaphoretic.  HENT:     Head: Normocephalic and atraumatic.     Comments: Thyromegaly    Mouth/Throat:     Mouth: Mucous membranes are moist.     Pharynx: Oropharynx is clear.     Comments: FTP 3+, no swelling of uvula  Eyes:     Extraocular Movements: Extraocular movements intact.     Right eye: Normal extraocular motion and no nystagmus.     Left eye: Normal extraocular motion and no nystagmus.     Pupils: Pupils are equal, round, and reactive to light.     Right eye: Pupil is round and reactive.     Left eye: Pupil is round and reactive.     Comments: Mild pallor  Cardiovascular:     Rate and Rhythm: Normal rate and regular rhythm.     Heart sounds: Normal heart sounds.  Pulmonary:     Effort: Pulmonary effort is normal.     Breath sounds: Normal breath sounds.  Musculoskeletal:     Cervical back: Normal range of motion and neck supple. No rigidity.  Lymphadenopathy:     Cervical: No cervical adenopathy.  Skin:    General: Skin is warm and dry.     Capillary Refill: Capillary refill takes more than 3 seconds.     Coloration: Skin is not cyanotic or pale.     Findings: No erythema or rash.  Neurological:     Mental Status: She is alert and oriented to person, place, and time.     Cranial Nerves: No cranial nerve deficit or facial asymmetry.     Sensory: No sensory deficit.     Motor: No weakness.     Coordination: Coordination normal.     Gait: Gait normal.     Deep Tendon Reflexes: Reflexes normal.  Psychiatric:        Mood and Affect: Mood normal.        Speech: Speech normal.        Behavior: Behavior normal.     UC Treatments / Results  Labs (  all labs ordered are listed, but only abnormal results are displayed) Labs Reviewed - No data to display  EKG   Radiology No results found.  Procedures Procedures (including critical care time)  Medications Ordered in UC Medications  alum & mag hydroxide-simeth (MAALOX/MYLANTA) 200-200-20  MG/5ML suspension 30 mL (has no administration in time range)    And  lidocaine (XYLOCAINE) 2 % viscous mouth solution 15 mL (has no administration in time range)    Initial Impression / Assessment and Plan / UC Course  I have reviewed the triage vital signs and the nursing notes.  Pertinent labs & imaging results that were available during my care of the patient were reviewed by me and considered in my medical decision making (see chart for details).     New.  Her EKG appears stable to her past EKG.  Were going to trial a GI cocktail.  Discussed labs with patient.  If her GI cocktail does help with symptoms we are going to proceed forward with a CBC and BMP here.  She is aware that they will take a few hours to result.  In the meantime I have recommended hydration with water and starting on iron supplementation as I strongly suspect that she has some degree of anemia.  We also discussed her thyromegaly and that I suspect that her symptoms are related to sleep apnea given her increased neck mass, obesity status and FTP status.  She has plans to establish with a primary care provider which I have encouraged and discussed my limitations such as testing for thyroid function here today, sleep study referral, etc. in the meantime we discussed a wedge pillow which she may find beneficial. Red flag signs and symptoms.  In the event that her chest pain is not resolved with the GI cocktail we did discuss referral to the ER for further cardiac testing such as troponins which I do not have available to me at our location.  Update: Patient's shortness of breath resolved with the GI cocktail and her chest pain significantly improved.  Sending her home on omeprazole.  Discussed red flag signs and symptoms. Final Clinical Impressions(s) / UC Diagnoses   Final diagnoses:  None   Discharge Instructions   None    ED Prescriptions   None    PDMP not reviewed this encounter.   Rushie Chestnut,  [place] 08/03/20 1134

## 2020-08-03 NOTE — ED Notes (Signed)
Provider notified of pt's suicide risk assessment score. Hazardous items removed from room, door left open, room in front of nurse station utilized. Clinical staff made aware.

## 2020-08-23 ENCOUNTER — Ambulatory Visit (HOSPITAL_COMMUNITY)
Admission: EM | Admit: 2020-08-23 | Discharge: 2020-08-23 | Disposition: A | Discharge status: Home / Self Care | Payer: 59 | Attending: Emergency Medicine | Admitting: Emergency Medicine

## 2020-08-23 ENCOUNTER — Other Ambulatory Visit: Payer: Self-pay

## 2020-08-23 ENCOUNTER — Encounter (HOSPITAL_COMMUNITY): Payer: Self-pay

## 2020-08-23 DIAGNOSIS — R21 Rash and other nonspecific skin eruption: Secondary | ICD-10-CM | POA: Diagnosis not present

## 2020-08-23 DIAGNOSIS — N939 Abnormal uterine and vaginal bleeding, unspecified: Secondary | ICD-10-CM

## 2020-08-23 MED ORDER — HYDROXYZINE HCL 25 MG PO TABS: 25.0000 mg | ORAL_TABLET | Freq: Four times a day (QID) | ORAL | 0 refills | Status: DC

## 2020-08-23 MED ORDER — PREDNISONE 10 MG (21) PO TBPK: ORAL_TABLET | Freq: Every day | ORAL | 0 refills | Status: DC

## 2020-08-23 NOTE — Discharge Instructions (Addendum)
Take prednisone per instructions on bottle  Can use hydroxyzine every 6 hours as needed for itching  Can use calamine lotion as well for additional comfort   Switch brands of sheets to help determine if this is the offending agent   Can follow up at urgent care if rash worsens or persist after medication completion

## 2020-08-23 NOTE — ED Triage Notes (Signed)
Pt presents with rash on arms, legs, and in scalp for over a week; pt also complains of abnormal vaginal bleeding X 2.5 weeks.

## 2020-08-23 NOTE — ED Provider Notes (Addendum)
[facility]    CSN: [id number] Arrival date & time: 08/23/20  4562      History   Chief Complaint Chief Complaint  Patient presents with   Vaginal Bleeding   Rash    HPI Kristin Wright is a 35 y.o. female.   Patient presents with pruritic rash that began one month ago starting on bilateral arms, has since spread to bilateral legs and her scalp. Attest to mild erythema on rash. Describes as draining clear fluid at times.  Denies fever and chills, changes in lotions, soaps, detergents, diet. Though new bed linen was the offending agent. Has switched linen on bed three times in the last month but using the same brand. has attempted use of benadryl and hydrocortisone cream with no relief.  Concerned with light vaginal bleeding over the last two weeks with some blood clots present. Has irregular menses due to implanted birth control. Has had birth control for almost three years. Denies abdominal pain or cramping, vaginal discharge, itching, pain, urinary frequency, urgency, back or flank pain.   Past Medical History:  Diagnosis Date   Fainting 2004   triggered by bright lights    There are no problems to display for this patient.   Past Surgical History:  Procedure Laterality Date   FASCIOTOMY Left 08/27/2019   Procedure: left leg anterior/lateral compartmental release;  Surgeon: Cammy Copa, MD;  Location: [facility] OR;  Service: Orthopedics;  Laterality: Left;    OB History     Gravida  4   Para  1   Term  1   Preterm      AB  3   Living  1      SAB  1   IAB  1   Ectopic  1   Multiple      Live Births  1            Home Medications    Prior to Admission medications   Medication Sig Start Date End Date Taking? Authorizing Provider  hydrOXYzine (ATARAX/VISTARIL) 25 MG tablet Take 1 tablet (25 mg total) by mouth every 6 (six) hours. 08/23/20  Yes Karl Knarr R, NP  predniSONE (STERAPRED UNI-PAK 21 TAB) 10 MG (21) TBPK tablet Take  by mouth daily. Take 6 tabs by mouth daily  for 2 days, then 5 tabs for 2 days, then 4 tabs for 2 days, then 3 tabs for 2 days, 2 tabs for 2 days, then 1 tab by mouth daily for 2 days 08/23/20  Yes Kalijah Zeiss R, NP  omeprazole (PRILOSEC) 20 MG capsule Take 1 capsule (20 mg total) by mouth daily. 08/03/20   Rushie Chestnut, PA-C    Family History Family History  Problem Relation Age of Onset   Hypertension Mother    Diabetes Mother    Hyperlipidemia Mother    Stroke Mother    Other Mother    Heart disease Mother    Asthma Father    Heart disease Maternal Grandmother    Breast cancer Maternal Grandmother    Cervical cancer Maternal Grandmother    Hypertension Maternal Grandmother    Diabetes Maternal Grandmother    Diabetes Maternal Grandfather    Heart disease Maternal Grandfather    Hyperlipidemia Maternal Grandfather     Social History Social History   Tobacco Use   Smoking status: Every Day    Years: 20.00    Pack years: 0.00    Types: Cigarettes   Smokeless  tobacco: Never   Tobacco comments:    1 pack of cigarettes every 2 weeks  Vaping Use   Vaping Use: Never used  Substance Use Topics   Alcohol use: No   Drug use: Yes    Frequency: 7.0 times per week    Types: Marijuana     Allergies   Aspirin, Milk-related compounds, Penicillins, Soy allergy, and Yeast-related products   Review of Systems Review of Systems  Constitutional: Negative.   Respiratory: Negative.    Cardiovascular: Negative.   Genitourinary:  Positive for vaginal bleeding. Negative for decreased urine volume, difficulty urinating, dyspareunia, dysuria, enuresis, flank pain, frequency, genital sores, hematuria, menstrual problem, pelvic pain, urgency, vaginal discharge and vaginal pain.  Skin:  Positive for rash. Negative for color change, pallor and wound.  Neurological: Negative.     Physical Exam Triage Vital Signs ED Triage Vitals  Enc Vitals Group     BP 08/23/20 0937 128/77      Pulse Rate 08/23/20 0937 76     Resp 08/23/20 0937 17     Temp 08/23/20 0937 98.3 F (36.8 C)     Temp Source 08/23/20 0937 Oral     SpO2 08/23/20 0937 95 %     Weight --      Height --      Head Circumference --      Peak Flow --      Pain Score 08/23/20 0942 1     Pain Loc --      Pain Edu? --      Excl. in GC? --    No data found.  Updated Vital Signs BP 128/77 (BP Location: Right Arm)   Pulse 76   Temp 98.3 F (36.8 C) (Oral)   Resp 17   LMP 08/02/2020 Comment: vaginal bleeding X 2.5 weeks  SpO2 95%   Visual Acuity Right Eye Distance:   Left Eye Distance:   Bilateral Distance:    Right Eye Near:   Left Eye Near:    Bilateral Near:     Physical Exam Constitutional:      Appearance: Normal appearance. She is obese.  Eyes:     Extraocular Movements: Extraocular movements intact.  Pulmonary:     Effort: Pulmonary effort is normal.  Skin:    Comments: Diffuse predominantly flesh toned papular rash over bilateral arms and legs, very few lesions erythematous, no lesions noted in scalp , see photo for reference, shows left arm, rash presents similiarly in all locations   Neurological:     Mental Status: She is alert and oriented to person, place, and time. Mental status is at baseline.  Psychiatric:        Mood and Affect: Mood normal.        Behavior: Behavior normal.     UC Treatments / Results  Labs (all labs ordered are listed, but only abnormal results are displayed) Labs Reviewed - No data to display  EKG   Radiology No results found.  Procedures Procedures (including critical care time)  Medications Ordered in UC Medications - No data to display  Initial Impression / Assessment and Plan / UC Course  I have reviewed the triage vital signs and the nursing notes.  Pertinent labs & imaging results that were available during my care of the patient were reviewed by me and considered in my medical decision making (see chart for  details).  Rash Vaginal bleeding  Prednisone 60 mg taper  Hydroxyzine 25 mg every 6 hours  as needed Otc calamine lotion for additional comfort Return precautions given for worsening or persistent rash Advised disposal of linen and trying new brand Vaginal bleeding most likely related to hormonal changes of implanted birth control. Patient at 3 year mark for needing for removal, her PCP will not remove, given information for local health department  Final Clinical Impressions(s) / UC Diagnoses   Final diagnoses:  Rash     Discharge Instructions      Take prednisone per instructions on bottle  Can use hydroxyzine every 6 hours as needed for itching  Can use calamine lotion as well for additional comfort   Switch brands of sheets to help determine if this is the offending agent   Can follow up at urgent care if rash worsens or persist after medication completion    ED Prescriptions     Medication Sig Dispense Auth. Provider   predniSONE (STERAPRED UNI-PAK 21 TAB) 10 MG (21) TBPK tablet Take by mouth daily. Take 6 tabs by mouth daily  for 2 days, then 5 tabs for 2 days, then 4 tabs for 2 days, then 3 tabs for 2 days, 2 tabs for 2 days, then 1 tab by mouth daily for 2 days 42 tablet Natalin Bible R, NP   hydrOXYzine (ATARAX/VISTARIL) 25 MG tablet Take 1 tablet (25 mg total) by mouth every 6 (six) hours. 12 tablet Adanna Zuckerman, Elita Boone, NP      PDMP not reviewed this encounter.   Valinda Hoar, NP 08/23/20 1022    Salli Quarry R, [place] 08/23/20 1022

## 2021-08-14 ENCOUNTER — Emergency Department (HOSPITAL_BASED_OUTPATIENT_CLINIC_OR_DEPARTMENT_OTHER): Payer: 59

## 2021-08-14 ENCOUNTER — Telehealth: Payer: Self-pay | Admitting: *Deleted

## 2021-08-14 ENCOUNTER — Other Ambulatory Visit: Payer: Self-pay

## 2021-08-14 ENCOUNTER — Encounter (HOSPITAL_BASED_OUTPATIENT_CLINIC_OR_DEPARTMENT_OTHER): Payer: Self-pay | Admitting: Obstetrics and Gynecology

## 2021-08-14 ENCOUNTER — Emergency Department (HOSPITAL_BASED_OUTPATIENT_CLINIC_OR_DEPARTMENT_OTHER)
Admission: EM | Admit: 2021-08-14 | Discharge: 2021-08-14 | Disposition: A | Discharge status: Home / Self Care | Payer: 59 | Attending: Emergency Medicine | Admitting: Emergency Medicine

## 2021-08-14 DIAGNOSIS — R42 Dizziness and giddiness: Secondary | ICD-10-CM | POA: Diagnosis present

## 2021-08-14 DIAGNOSIS — R079 Chest pain, unspecified: Secondary | ICD-10-CM

## 2021-08-14 DIAGNOSIS — R0789 Other chest pain: Secondary | ICD-10-CM | POA: Insufficient documentation

## 2021-08-14 LAB — BASIC METABOLIC PANEL
Anion gap: 10 (ref 5–15)
BUN: 10 mg/dL (ref 6–20)
CO2: 22 mmol/L (ref 22–32)
Calcium: 9.2 mg/dL (ref 8.9–10.3)
Chloride: 107 mmol/L (ref 98–111)
Creatinine, Ser: 0.74 mg/dL (ref 0.44–1.00)
GFR, Estimated: >60 mL/min (ref >60)
Glucose, Bld: 100 mg/dL — ABNORMAL HIGH (ref 70–99)
Potassium: 3.8 mmol/L (ref 3.5–5.1)
Sodium: 139 mmol/L (ref 135–145)

## 2021-08-14 LAB — CBC
HCT: 42.7 % (ref 36.0–46.0)
Hemoglobin: 14 g/dL (ref 12.0–15.0)
MCH: 32.4 pg (ref 26.0–34.0)
MCHC: 32.8 g/dL (ref 30.0–36.0)
MCV: 98.8 fL (ref 80.0–100.0)
Platelets: 384 10*3/uL (ref 150–400)
RBC: 4.32 MIL/uL (ref 3.87–5.11)
RDW: 13 % (ref 11.5–15.5)
WBC: 7.8 10*3/uL (ref 4.0–10.5)
nRBC: 0 % (ref 0.0–0.2)

## 2021-08-14 LAB — PREGNANCY, URINE: Preg Test, Ur: NEGATIVE

## 2021-08-14 LAB — TROPONIN I (HIGH SENSITIVITY)
Troponin I (High Sensitivity): <2 ng/L (ref <18)
Troponin I (High Sensitivity): <2 ng/L (ref <18)

## 2021-08-14 LAB — D-DIMER, QUANTITATIVE: D-Dimer, Quant: 0.48 ug/mL-FEU (ref 0.00–0.50)

## 2021-08-14 MED ORDER — IOHEXOL 350 MG/ML SOLN
100.0000 mL | Freq: Once | INTRAVENOUS | Status: AC | PRN
  Administered 2021-08-14: 100 mL via INTRAVENOUS

## 2021-08-14 MED ORDER — MORPHINE SULFATE (PF) 4 MG/ML IV SOLN
4.0000 mg | Freq: Once | INTRAVENOUS | Status: AC
  Administered 2021-08-14: 4 mg via INTRAVENOUS
  Filled 2021-08-14: qty 1

## 2021-08-14 MED ORDER — ONDANSETRON HCL 4 MG/2ML IJ SOLN
4.0000 mg | Freq: Once | INTRAMUSCULAR | Status: AC
  Administered 2021-08-14: 4 mg via INTRAVENOUS
  Filled 2021-08-14: qty 2

## 2021-08-14 NOTE — ED Provider Notes (Signed)
[facility] EMERGENCY DEPT Provider Note   CSN: [id number] Arrival date & time: 08/14/21  2202     History  Chief Complaint  Patient presents with   Dizziness   Chest Pain    Kristin Wright is a 36 y.o. female.  Patient is a 36 yo female presenting for chest pain. Patient admits to chest pain described as chest pressure and chest heaviness, without radiation, acute in onset, and severe, resulting in nausea, vomiting, and diarrhea or urination, and diaphoresis. Today's episode occurred while on the toilet but must episodes occur with exertion and improve with rest. These episodes occur 3 times a week for the past severa lmonths. Pt does not follow with a pcp. No prior medical hx bc does not follow with pcp. Maternal hx of MI, maternal grandmother MI, maternal grandfather MI.    The history is provided by the patient. No language interpreter was used.  Dizziness Associated symptoms: chest pain, nausea and vomiting   Associated symptoms: no palpitations and no shortness of breath        Home Medications Prior to Admission medications   Medication Sig Start Date End Date Taking? Authorizing Provider  hydrOXYzine (ATARAX/VISTARIL) 25 MG tablet Take 1 tablet (25 mg total) by mouth every 6 (six) hours. 08/23/20   Valinda Hoar, NP  omeprazole (PRILOSEC) 20 MG capsule Take 1 capsule (20 mg total) by mouth daily. 08/03/20   Rushie Chestnut, PA-C  predniSONE (STERAPRED UNI-PAK 21 TAB) 10 MG (21) TBPK tablet Take by mouth daily. Take 6 tabs by mouth daily  for 2 days, then 5 tabs for 2 days, then 4 tabs for 2 days, then 3 tabs for 2 days, 2 tabs for 2 days, then 1 tab by mouth daily for 2 days 08/23/20   Valinda Hoar, NP      Allergies    Aspirin, Milk-related compounds, Penicillins, Soy allergy, and Yeast-related products    Review of Systems   Review of Systems  Constitutional:  Positive for diaphoresis. Negative for chills and fever.  HENT:  Negative for ear  pain and sore throat.   Eyes:  Negative for pain and visual disturbance.  Respiratory:  Negative for cough and shortness of breath.   Cardiovascular:  Positive for chest pain. Negative for palpitations.  Gastrointestinal:  Positive for nausea and vomiting. Negative for abdominal pain.       Incontinence   Genitourinary:  Negative for dysuria and hematuria.  Musculoskeletal:  Negative for arthralgias and back pain.  Skin:  Negative for color change and rash.  Neurological:  Positive for dizziness. Negative for seizures and syncope.  All other systems reviewed and are negative.   Physical Exam Updated Vital Signs BP (!) 140/101   Pulse 65   Temp 98.7 F (37.1 C) (Oral)   Resp 12   Ht 5\' 7"  (1.702 m)   Wt (!) 142.9 kg   SpO2 100%   BMI 49.34 kg/m  Physical Exam Vitals and nursing note reviewed.  Constitutional:      General: She is not in acute distress.    Appearance: She is well-developed.  HENT:     Head: Normocephalic and atraumatic.  Eyes:     Conjunctiva/sclera: Conjunctivae normal.  Cardiovascular:     Rate and Rhythm: Normal rate and regular rhythm.     Heart sounds: No murmur heard. Pulmonary:     Effort: Pulmonary effort is normal. No respiratory distress.     Breath sounds: Normal breath  sounds.  Abdominal:     Palpations: Abdomen is soft.     Tenderness: There is no abdominal tenderness.  Musculoskeletal:        General: No swelling.     Cervical back: Neck supple.  Skin:    General: Skin is warm and dry.     Capillary Refill: Capillary refill takes less than 2 seconds.  Neurological:     Mental Status: She is alert.  Psychiatric:        Mood and Affect: Mood normal.     ED Results / Procedures / Treatments   Labs (all labs ordered are listed, but only abnormal results are displayed) Labs Reviewed  BASIC METABOLIC PANEL - Abnormal; Notable for the following components:      Result Value   Glucose, Bld 100 (*)    All other components within  normal limits  CBC  D-DIMER, QUANTITATIVE  PREGNANCY, URINE  TROPONIN I (HIGH SENSITIVITY)  TROPONIN I (HIGH SENSITIVITY)    EKG EKG Interpretation  Date/Time:  Monday August 14 2021 08:15:48 EDT Ventricular Rate:  72 PR Interval:  158 QRS Duration: 97 QT Interval:  390 QTC Calculation: 427 R Axis:   26 Text Interpretation: Sinus rhythm Baseline wander in lead(s) V1 V2 V5 V6 Confirmed by Edwin Dada ([id number]) on 08/14/2021 8:43:17 AM  Radiology No results found.  Procedures Procedures    Medications Ordered in ED Medications  morphine (PF) 4 MG/ML injection 4 mg (4 mg Intravenous Given 08/14/21 0823)  ondansetron (ZOFRAN) injection 4 mg (4 mg Intravenous Given 08/14/21 0823)  iohexol (OMNIPAQUE) 350 MG/ML injection 100 mL (100 mLs Intravenous Contrast Given 08/14/21 0914)    ED Course/ Medical Decision Making/ A&P                           Medical Decision Making Amount and/or Complexity of Data Reviewed Labs: ordered. Radiology: ordered.  Risk Prescription drug management.   36 yo female presenting for chest pain described as chest pressure and chest heaviness, without radiation, acute in onset, and severe, resulting in nausea, vomiting, and diarrhea or urination, and diaphoresis.  Patient denies any current symptoms at this time.  On exam patient is alert and oriented x3, no acute distress, afebrile, stable vital signs.  Physical exam demonstrates no heart murmurs, equal bilateral breath sounds with no adventitious lung sounds, no leg swelling.   ECG demonstrates normal sinus rhythm.  Stable rate.  No ST segment ovation or depression.  Stable intervals.  Patient has stable chest x-ray, electrolytes, and troponins.  10:06 AM To bedside, patient having repeat symptoms.  Patient is in acute distress, tearful, diaphoretic, soaked in sweat, and having urinary incontinence.  Morphine and Zofran given.  Repeat ECG continues to demonstrate normal sinus rhythm with no acute  changes.  CT dissection demonstrates no acute process. Dimer negative low suspicion PE. No Seizure-like activity during events.  Spoke with on-call cardiology from [facility] regarding patient's symptoms they reviewed the patient's chart and ECG and proposed symptoms are probably unlikely to be related to cardiovascular events.  Nonetheless recommends close follow-up if symptoms continue in their office.  Directions given to patient.  Patient is currently asymptomatic and stable for discharge at this time.  Patient in no distress and overall condition improved here in the ED. Detailed discussions were had with the patient regarding current findings, and need for close f/u with PCP or on call doctor. The  patient has been instructed to return immediately if the symptoms worsen in any way for re-evaluation. Patient verbalized understanding and is in agreement with current care plan. All questions answered prior to discharge.         Final Clinical Impression(s) / ED Diagnoses Final diagnoses:  Chest pain, unspecified type  Dizziness    Rx / DC Orders ED Discharge Orders     None         Franne Forts, DO 08/17/21 1006

## 2021-08-14 NOTE — ED Notes (Signed)
Patient placed on a pure wick due to incontinence episodes. MD made aware. Patient reports she has been having these episodes with any type of bodily stress for x8 months. Patient reports now she has to wear multiple layers of clothing in case she wets herself.

## 2021-08-14 NOTE — ED Triage Notes (Signed)
Patient reports to the ER reporting dizziness, nausea and sweating. Patient reports she is having exertional chest pain. Patient reports she tries to sit down and her chest pain will ease off. Patient is visibly short of breath with exertion as well

## 2021-08-14 NOTE — Telephone Encounter (Signed)
RNCM obtained appointment on (08/23/2021), time (0845)  and placed on After Visit Summary paperwork.  No further case management needs communicated at this time. Korynn Kenedy J. Lucretia Roers, RN, BSN, [place] [phone]

## 2021-08-17 ENCOUNTER — Ambulatory Visit (INDEPENDENT_AMBULATORY_CARE_PROVIDER_SITE_OTHER): Payer: 59

## 2021-08-17 ENCOUNTER — Ambulatory Visit (INDEPENDENT_AMBULATORY_CARE_PROVIDER_SITE_OTHER): Payer: 59 | Admitting: Cardiology

## 2021-08-17 VITALS — BP 139/83 | HR 83 | Ht 67.0 in | Wt 317.2 lb

## 2021-08-17 DIAGNOSIS — R079 Chest pain, unspecified: Secondary | ICD-10-CM

## 2021-08-17 DIAGNOSIS — R0683 Snoring: Secondary | ICD-10-CM

## 2021-08-17 DIAGNOSIS — E66813 Obesity, class 3: Secondary | ICD-10-CM

## 2021-08-17 DIAGNOSIS — R002 Palpitations: Secondary | ICD-10-CM | POA: Diagnosis not present

## 2021-08-17 DIAGNOSIS — R400 Somnolence: Secondary | ICD-10-CM | POA: Diagnosis not present

## 2021-08-17 DIAGNOSIS — Z6841 Body Mass Index (BMI) 40.0 and over, adult: Secondary | ICD-10-CM

## 2021-08-17 NOTE — Progress Notes (Signed)
Cardiology Office Note:    Date:  08/17/2021   ID:  Kristin Wright, DOB [birth year], MRN [id number]  PCP:  Leilani Able, MD  Cardiologist:  None  Electrophysiologist:  None   Referring MD: Leilani Able, MD   Chief Complaint  Patient presents with   Chest Pain   Dizziness   Shortness of Breath    History of Present Illness:    Kristin Wright is a 36 y.o. female with a hx of tobacco use who is referred by Dr. Pecola Leisure for evaluation of chest pain and dizziness.  She reports that she has been having chest pain over the last 8 to 9 months.  Reports pain woke her up from her sleep on Monday morning.  States it felt like someone was sitting on her chest.  Lasted about 10 minutes and resolved.  Went to ED.  Troponins negative x2.  Chest CTA unremarkable.  She reports her chest pain that she has been having over the last 8 to 9 months has been similar but less intense.  Typically lasts for few minutes.  Can occur at rest but states that does seem to be brought on when she exerts herself at work.  Also reports getting short of breath with minimal exertion.  She has been feeling lightheaded.  Denies any syncope.  Reports has been having lower extremity edema.  She has been having palpitations where she feels like her heart is racing, occurs daily.  She smokes at least 4 cigarettes/day.  She was smoking up to 2 packs/day, smoked for last 20 years.  Family history includes mother had PCI in early 62s, maternal grandfather died of MI in 36s, and maternal grandmother died of MI in 13s.   Past Medical History:  Diagnosis Date   Fainting 2004   triggered by bright lights    Past Surgical History:  Procedure Laterality Date   FASCIOTOMY Left 08/27/2019   Procedure: left leg anterior/lateral compartmental release;  Surgeon: Cammy Copa, MD;  Location: [facility] OR;  Service: Orthopedics;  Laterality: Left;    Current Medications: No outpatient medications have been marked as taking for the 08/17/21  encounter (Office Visit) with Little Ishikawa, MD.     Allergies:   Aspirin, Milk-related compounds, Penicillins, Soy allergy, and Yeast-related products   Social History   Socioeconomic History   Marital status: Single    Spouse name: Not on file   Number of children: Not on file   Years of education: Not on file   Highest education level: Not on file  Occupational History   Not on file  Tobacco Use   Smoking status: Every Day    Packs/day: 0.25    Years: 20.00    Total pack years: 5.00    Types: Cigarettes   Smokeless tobacco: Never   Tobacco comments:    1 pack of cigarettes every 2 weeks  Vaping Use   Vaping Use: Never used  Substance and Sexual Activity   Alcohol use: No   Drug use: Yes    Frequency: 7.0 times per week    Types: Marijuana   Sexual activity: Not Currently    Birth control/protection: Implant  Other Topics Concern   Not on file  Social History Narrative   Not on file   Social Determinants of Health   Financial Resource Strain: Low Risk  (08/17/2021)   Overall Financial Resource Strain (CARDIA)    Difficulty of Paying Living Expenses: Not hard at all  Food  Insecurity: No Food Insecurity (08/17/2021)   Hunger Vital Sign    Worried About Running Out of Food in the Last Year: Never true    Ran Out of Food in the Last Year: Never true  Transportation Needs: No Transportation Needs (08/17/2021)   PRAPARE - Administrator, Civil Service (Medical): No    Lack of Transportation (Non-Medical): No  Physical Activity: Inactive (08/17/2021)   Exercise Vital Sign    Days of Exercise per Week: 0 days    Minutes of Exercise per Session: 0 min  Stress: Not on file  Social Connections: Not on file     Family History: The patient's family history includes Asthma in her father; Breast cancer in her maternal grandmother; Cervical cancer in her maternal grandmother; Diabetes in her maternal grandfather, maternal grandmother, and mother; Heart  disease in her maternal grandfather, maternal grandmother, and mother; Hyperlipidemia in her maternal grandfather and mother; Hypertension in her maternal grandmother and mother; Other in her mother; Stroke in her mother.  ROS:   Please see the history of present illness.     All other systems reviewed and are negative.  EKGs/Labs/Other Studies Reviewed:    The following studies were reviewed today:   EKG:   08/17/2021: Normal sinus rhythm, rate 83, no ST abnormalities  Recent Labs: 08/14/2021: BUN 10; Creatinine, Ser 0.74; Hemoglobin 14.0; Platelets 384; Potassium 3.8; Sodium 139  Recent Lipid Panel    Component Value Date/Time   CHOL 159 07/03/2017 0938   TRIG 65 07/03/2017 0938   HDL 35 (L) 07/03/2017 0938   CHOLHDL 4.5 (H) 07/03/2017 0938   LDLCALC 111 (H) 07/03/2017 1324    Physical Exam:    VS:  BP 139/83 (BP Location: Right Arm, Patient Position: Sitting, Cuff Size: Large)   Pulse 83   Ht 5\' 7"  (1.702 m)   Wt (!) 317 lb 3.2 oz (143.9 kg)   BMI 49.68 kg/m     Wt Readings from Last 3 Encounters:  08/17/21 (!) 317 lb 3.2 oz (143.9 kg)  08/14/21 (!) 315 lb (142.9 kg)  08/27/19 (!) 327 lb (148.3 kg)     GEN:  Well nourished, well developed in no acute distress HEENT: Normal NECK: No JVD; No carotid bruits LYMPHATICS: No lymphadenopathy CARDIAC: RRR, no murmurs, rubs, gallops RESPIRATORY:  Clear to auscultation without rales, wheezing or rhonchi  ABDOMEN: Soft, non-tender, non-distended MUSCULOSKELETAL:  No edema; No deformity  SKIN: Warm and dry NEUROLOGIC:  Alert and oriented x 3 PSYCHIATRIC:  Normal affect   ASSESSMENT:    1. Chest pain of uncertain etiology   2. Snoring   3. Daytime somnolence   4. Palpitations   5. Class 3 severe obesity with body mass index (BMI) of 45.0 to 49.9 in adult, unspecified obesity type, unspecified whether serious comorbidity present (HCC)    PLAN:    Chest pain: Atypical in description, but does report occurs with  exertion.  Also with shortness of breath with minimal exertion.  Despite her age, she does have risk factors for CAD (significant tobacco use, family history). -Not a good candidate for coronary CTA given BMI 50.  Recommend stress PET to evaluate for ischemia. -Echocardiogram to rule out structural heart disease  Palpitations: Description concerning for arrhythmia, evaluate with Zio patch x7 days  Snoring/daytime somnolence: Suspect OSA, will check sleep study  Morbid obesity: BMI 50.  Referred to healthy weight and wellness  RTC in 3 months   Shared Decision Making/Informed Consent The  risks [chest pain, shortness of breath, cardiac arrhythmias, dizziness, blood pressure fluctuations, myocardial infarction, stroke/transient ischemic attack, nausea, vomiting, allergic reaction, radiation exposure, metallic taste sensation and life-threatening complications (estimated to be 1 in 10,000)], benefits (risk stratification, diagnosing coronary artery disease, treatment guidance) and alternatives of a cardiac PET stress test were discussed in detail with Ms. Lou and she agrees to proceed.   Medication Adjustments/Labs and Tests Ordered: Current medicines are reviewed at length with the patient today.  Concerns regarding medicines are outlined above.  Orders Placed This Encounter  Procedures   NM PET [place]   Ambulatory referral to [place] (3-14 DAYS)   EKG 12-Lead   ECHOCARDIOGRAM COMPLETE   Split night study   No orders of the defined types were placed in this encounter.   Patient Instructions  Medication Instructions:  Your physician recommends that you continue on your current medications as directed. Please refer to the Current Medication list given to you today.  *If you need a refill on your cardiac medications before your next appointment, please call your pharmacy*  Testing/Procedures: Your physician has  requested that you have an echocardiogram. Echocardiography is a painless test that uses sound waves to create images of your heart. It provides your doctor with information about the size and shape of your heart and how well your heart's chambers and valves are working. This procedure takes approximately one hour. There are no restrictions for this procedure.  CARDIAC PET- Your physician has requested that you have a Cardiac Pet Stress Test. This testing is completed at [facility] ([place], [place] [address]). The schedulers will call you to get this scheduled. Please follow instructions below and call the office with any questions/concerns [phone]).  Your physician has recommended that you have a sleep study. This test records several body functions during sleep, including: brain activity, eye movement, oxygen and carbon dioxide blood levels, heart rate and rhythm, breathing rate and rhythm, the flow of air through your mouth and nose, snoring, body muscle movements, and chest and belly movement.  ZIO XT- Long Term Monitor Instructions   Your physician has requested you wear a ZIO patch monitor for _7_ days.  This is a single patch monitor.   [employer] supplies one patch monitor per enrollment. Additional stickers are not available. Please do not apply patch if you will be having a Nuclear Stress Test, Echocardiogram, Cardiac CT, MRI, or Chest Xray during the period you would be wearing the monitor. The patch cannot be worn during these tests. You cannot remove and re-apply the ZIO XT patch monitor.  Your ZIO patch monitor will be sent Fed Ex from [employer] directly to your home address. It may take 3-5 days to receive your monitor after you have been enrolled.  Once you have received your monitor, please review the enclosed instructions. Your monitor has already been registered assigning a specific monitor serial # to you.  Billing and Patient  Assistance Program Information   We have supplied [employer] with any of your insurance information on file for billing purposes. [employer] offers a sliding scale Patient Assistance Program for patients that do not have insurance, or whose insurance does not completely cover the cost of the ZIO monitor.   You must apply for the Patient Assistance Program to qualify for this discounted rate.     To apply, please call [employer] at [phone], select option 4, then select  option 2, and ask to apply for Patient Assistance Program.  Theodore Demark will ask your household income, and how many people are in your household.  They will quote your out-of-pocket cost based on that information.  [employer] will also be able to set up a 42-month, interest-free payment plan if needed.  Applying the monitor   Shave hair from upper left chest.  Hold abrader disc by orange tab. Rub abrader in 40 strokes over the upper left chest as indicated in your monitor instructions.  Clean area with 4 enclosed alcohol pads. Let dry.  Apply patch as indicated in monitor instructions. Patch will be placed under collarbone on left side of chest with arrow pointing upward.  Rub patch adhesive wings for 2 minutes. Remove white label marked "1". Remove the white label marked "2". Rub patch adhesive wings for 2 additional minutes.  While looking in a mirror, press and release button in center of patch. A small green light will flash 3-4 times. This will be your only indicator that the monitor has been turned on. ?  Do not shower for the first 24 hours. You may shower after the first 24 hours.  Press the button if you feel a symptom. You will hear a small click. Record Date, Time and Symptom in the Patient Logbook.  When you are ready to remove the patch, follow instructions on the last 2 pages of the Patient Logbook. Stick patch monitor onto the last page of Patient Logbook.  Place Patient Logbook in the blue and white box.  Use locking tab on box  and tape box closed securely.  The blue and white box has prepaid postage on it. Please place it in the mailbox as soon as possible. Your physician should have your test results approximately 7 days after the monitor has been mailed back to [facility].  Call [place] at [phone] if you have questions regarding your ZIO XT patch monitor. Call them immediately if you see an orange light blinking on your monitor.  If your monitor falls off in less than 4 days, contact our Monitor department at [phone]. ?If your monitor becomes loose or falls off after 4 days call [employer] at [phone] for suggestions on securing your monitor.?  Follow-Up: At [facility], you and your health needs are our priority.  As part of our continuing mission to provide you with exceptional heart care, we have created designated Provider Care Teams.  These Care Teams include your primary Cardiologist (physician) and Advanced Practice Providers (APPs -  Physician Assistants and Nurse Practitioners) who all work together to provide you with the care you need, when you need it.  We recommend signing up for the patient portal called "MyChart".  Sign up information is provided on this After Visit Summary.  MyChart is used to connect with patients for Virtual Visits (Telemedicine).  Patients are able to view lab/test results, encounter notes, upcoming appointments, etc.  Non-urgent messages can be sent to your provider as well.   To learn more about what you can do with MyChart, go to [email or web address].    Your next appointment:   3 month(s)  The format for your next appointment:   In Person  Provider:   Dr. Gardiner Rhyme  Other Instructions You have been referred to: Healthy Weight and [place]         Signed, Donato Heinz, MD  08/17/2021 5:56 PM    [place]

## 2021-08-17 NOTE — Patient Instructions (Signed)
Medication Instructions:  Your physician recommends that you continue on your current medications as directed. Please refer to the Current Medication list given to you today.  *If you need a refill on your cardiac medications before your next appointment, please call your pharmacy*  Testing/Procedures: Your physician has requested that you have an echocardiogram. Echocardiography is a painless test that uses sound waves to create images of your heart. It provides your doctor with information about the size and shape of your heart and how well your heart's chambers and valves are working. This procedure takes approximately one hour. There are no restrictions for this procedure.  CARDIAC PET- Your physician has requested that you have a Cardiac Pet Stress Test. This testing is completed at [facility] ([address] [place], [place] [address]). The schedulers will call you to get this scheduled. Please follow instructions below and call the office with any questions/concerns [phone]).  Your physician has recommended that you have a sleep study. This test records several body functions during sleep, including: brain activity, eye movement, oxygen and carbon dioxide blood levels, heart rate and rhythm, breathing rate and rhythm, the flow of air through your mouth and nose, snoring, body muscle movements, and chest and belly movement.  ZIO XT- Long Term Monitor Instructions   Your physician has requested you wear a ZIO patch monitor for _7_ days.  This is a single patch monitor.   [employer] supplies one patch monitor per enrollment. Additional stickers are not available. Please do not apply patch if you will be having a Nuclear Stress Test, Echocardiogram, Cardiac CT, MRI, or Chest Xray during the period you would be wearing the monitor. The patch cannot be worn during these tests. You cannot remove and re-apply the ZIO XT patch monitor.  Your ZIO patch monitor will be sent Fed  Ex from [employer] directly to your home address. It may take 3-5 days to receive your monitor after you have been enrolled.  Once you have received your monitor, please review the enclosed instructions. Your monitor has already been registered assigning a specific monitor serial # to you.  Billing and Patient Assistance Program Information   We have supplied [employer] with any of your insurance information on file for billing purposes. [employer] offers a sliding scale Patient Assistance Program for patients that do not have insurance, or whose insurance does not completely cover the cost of the ZIO monitor.   You must apply for the Patient Assistance Program to qualify for this discounted rate.     To apply, please call [employer] at [phone], select option 4, then select option 2, and ask to apply for Patient Assistance Program.  Meredeth Ide will ask your household income, and how many people are in your household.  They will quote your out-of-pocket cost based on that information.  [employer] will also be able to set up a 17-month, interest-free payment plan if needed.  Applying the monitor   Shave hair from upper left chest.  Hold abrader disc by orange tab. Rub abrader in 40 strokes over the upper left chest as indicated in your monitor instructions.  Clean area with 4 enclosed alcohol pads. Let dry.  Apply patch as indicated in monitor instructions. Patch will be placed under collarbone on left side of chest with arrow pointing upward.  Rub patch adhesive wings for 2 minutes. Remove white label marked "1". Remove the white label marked "2". Rub patch adhesive wings for 2 additional minutes.  While looking in  a mirror, press and release button in center of patch. A small green light will flash 3-4 times. This will be your only indicator that the monitor has been turned on. ?  Do not shower for the first 24 hours. You may shower after the first 24 hours.  Press the button if you feel a symptom.  You will hear a small click. Record Date, Time and Symptom in the Patient Logbook.  When you are ready to remove the patch, follow instructions on the last 2 pages of the Patient Logbook. Stick patch monitor onto the last page of Patient Logbook.  Place Patient Logbook in the blue and white box.  Use locking tab on box and tape box closed securely.  The blue and white box has prepaid postage on it. Please place it in the mailbox as soon as possible. Your physician should have your test results approximately 7 days after the monitor has been mailed back to [facility].  Call [facility] Customer Care at [phone] if you have questions regarding your ZIO XT patch monitor. Call them immediately if you see an orange light blinking on your monitor.  If your monitor falls off in less than 4 days, contact our Monitor department at [phone]. ?If your monitor becomes loose or falls off after 4 days call [employer] at [phone] for suggestions on securing your monitor.?  Follow-Up: At [facility], you and your health needs are our priority.  As part of our continuing mission to provide you with exceptional heart care, we have created designated Provider Care Teams.  These Care Teams include your primary Cardiologist (physician) and Advanced Practice Providers (APPs -  Physician Assistants and Nurse Practitioners) who all work together to provide you with the care you need, when you need it.  We recommend signing up for the patient portal called "MyChart".  Sign up information is provided on this After Visit Summary.  MyChart is used to connect with patients for Virtual Visits (Telemedicine).  Patients are able to view lab/test results, encounter notes, upcoming appointments, etc.  Non-urgent messages can be sent to your provider as well.   To learn more about what you can do with MyChart, go to [email or web address].    Your next appointment:   3 month(s)  The format for your next  appointment:   In Person  Provider:   Dr. Bjorn Pippin  Other Instructions You have been referred to: [facility]   Important Information About Sugar

## 2021-08-17 NOTE — Progress Notes (Unsigned)
Enrolled for [employer] to mail a ZIO XT long term holter monitor to the patients address on file.  

## 2021-08-19 DIAGNOSIS — R002 Palpitations: Secondary | ICD-10-CM

## 2021-08-23 ENCOUNTER — Ambulatory Visit (INDEPENDENT_AMBULATORY_CARE_PROVIDER_SITE_OTHER): Payer: 59

## 2021-08-23 ENCOUNTER — Other Ambulatory Visit: Payer: Self-pay | Admitting: Internal Medicine

## 2021-08-23 DIAGNOSIS — N393 Stress incontinence (female) (male): Secondary | ICD-10-CM

## 2021-08-23 DIAGNOSIS — R0789 Other chest pain: Secondary | ICD-10-CM | POA: Diagnosis not present

## 2021-08-23 DIAGNOSIS — F32A Depression, unspecified: Secondary | ICD-10-CM | POA: Diagnosis not present

## 2021-08-23 DIAGNOSIS — G4733 Obstructive sleep apnea (adult) (pediatric): Secondary | ICD-10-CM | POA: Insufficient documentation

## 2021-08-23 DIAGNOSIS — E785 Hyperlipidemia, unspecified: Secondary | ICD-10-CM | POA: Insufficient documentation

## 2021-08-23 DIAGNOSIS — R400 Somnolence: Secondary | ICD-10-CM

## 2021-08-23 DIAGNOSIS — Z6841 Body Mass Index (BMI) 40.0 and over, adult: Secondary | ICD-10-CM

## 2021-08-23 DIAGNOSIS — Z309 Encounter for contraceptive management, unspecified: Secondary | ICD-10-CM

## 2021-08-23 DIAGNOSIS — Z3046 Encounter for surveillance of implantable subdermal contraceptive: Secondary | ICD-10-CM

## 2021-08-23 NOTE — Assessment & Plan Note (Addendum)
Patient with BMI of 50.54 today. Has had elevated BP in the hospital and clinic but BP found to be 111/72 on repeat. Recent BMP normal for kidney function and blood glucose levels. She has been referred to healthy weight and wellness clinic by cardiology, will follow up on weight loss goals at next visit.  -PREP referral -Nutrition referral with Lupita Leash

## 2021-08-23 NOTE — Assessment & Plan Note (Signed)
Patient was recently seen by cardiology for atypical chest pain and palpitations with presyncope. Cardiology ordered PET stress test and Ziopatch, and will follow these result.

## 2021-08-23 NOTE — Assessment & Plan Note (Addendum)
Patient with Nexplanon placed in 2018 now with recurrence of irregular menstruation. Wants to get retrieval of the device and new contraception but unable to reach the placing provider. Will refer to gynecology for removal and new contraception management.

## 2021-08-23 NOTE — Assessment & Plan Note (Signed)
G1P1 patient describes urinary leakage with laughing and sneezing. Denies overflow or urgency. She gave natural birth that required assistive devices. This is consistent with stress incontinence related to childbirth and BMI of 50. Will refer to gynecology for contraception and incontinence management.

## 2021-08-23 NOTE — Patient Instructions (Addendum)
Thank you, [name] for allowing [place] to provide your care today. Today we discussed : Today we established your care at the internal medicine center. We talked about your primary goals of exchanging your implantable nexplanon contraceptive device . We also talked about your urine leakage when you sneeze and laugh, which is called stress incontinence. We are referring you to gynecology for both of these. Your blood pressure has been a little high when first measured but normalizes when repeated. We will continue to watch this on follow up visits. We repeated your lipid panel to check on your cholesterol levels given that they were slightly elevated in 2019. We will continue to follow recommendations from cardiology. Please follow up in 6 months.  I have ordered the following labs for you:  Lab Orders         Lipid Profile        Referrals ordered today:   Referral Orders  No referral(s) requested today       We look forward to seeing you next time. Please call our clinic at [phone] if you have any questions or concerns. The best time to call is Monday-Friday from 9am-4pm, but there is someone available 24/7. If after hours or the weekend, call the main hospital number and ask for the Internal Medicine Resident On-Call. If you need medication refills, please notify your pharmacy one week in advance and they will send [place] a request.   Thank you for trusting me with your care. Wishing you the best!   Rudene Christians, DO [facility]

## 2021-08-23 NOTE — Assessment & Plan Note (Addendum)
Last lipid panel in 2019 showed high LDL of 111 and low HDL of 35. In the setting of extensive family history of hyperlipidemia , CVD, and personal history of obesity, rechecked lipids and LDL found to be 127. LDL goal of <100 through lifestyle modification. Will consider oral medications at next visit given ASCVD risk of 40% over lifetime.

## 2021-08-23 NOTE — Progress Notes (Addendum)
New Patient Office Visit  Subjective    Patient ID: Kristin Wright, female    DOB: 1985-09-12  Age: 36 y.o. MRN: [id number]  CC:  Chief Complaint  Patient presents with   Hospitalization Follow-up   Establish Care    HPI Kristin Wright is a 36 y/o female with a pmh outlined below who presents to establish care.   No outpatient encounter medications on file as of 08/23/2021.   No facility-administered encounter medications on file as of 08/23/2021.   PMH: Depression, Stress incontinence, Hyperlipidemia, atypical chest pain with palpitations   Past Surgical History:  Procedure Laterality Date   FASCIOTOMY Left 08/27/2019   Procedure: left leg anterior/lateral compartmental release;  Surgeon: Cammy Copa, MD;  Location: [facility] OR;  Service: Orthopedics;  Laterality: Left;    Family History  Problem Relation Age of Onset   Hypertension Mother    Diabetes Mother    Hyperlipidemia Mother    Stroke Mother    Other Mother    Heart disease Mother    Asthma Father    Heart disease Maternal Grandmother    Breast cancer Maternal Grandmother    Cervical cancer Maternal Grandmother    Hypertension Maternal Grandmother    Diabetes Maternal Grandmother    Diabetes Maternal Grandfather    Heart disease Maternal Grandfather    Hyperlipidemia Maternal Grandfather     Social History   Socioeconomic History   Marital status: Single    Spouse name: Not on file   Number of children: Not on file   Years of education: Not on file   Highest education level: Not on file  Occupational History   Not on file  Tobacco Use   Smoking status: Every Day    Packs/day: 0.25    Years: 20.00    Total pack years: 5.00    Types: Cigarettes   Smokeless tobacco: Never   Tobacco comments:    1 pack of cigarettes every 2 weeks  Vaping Use   Vaping Use: Never used  Substance and Sexual Activity   Alcohol use: No   Drug use: Yes    Frequency: 7.0 times per week    Types: Marijuana    Sexual activity: Not Currently    Birth control/protection: Implant  Other Topics Concern   Not on file  Social History Narrative   Not on file   Social Determinants of Health   Financial Resource Strain: Low Risk  (08/17/2021)   Overall Financial Resource Strain (CARDIA)    Difficulty of Paying Living Expenses: Not hard at all  Food Insecurity: No Food Insecurity (08/17/2021)   Hunger Vital Sign    Worried About Running Out of Food in the Last Year: Never true    Ran Out of Food in the Last Year: Never true  Transportation Needs: No Transportation Needs (08/17/2021)   PRAPARE - Administrator, Civil Service (Medical): No    Lack of Transportation (Non-Medical): No  Physical Activity: Inactive (08/17/2021)   Exercise Vital Sign    Days of Exercise per Week: 0 days    Minutes of Exercise per Session: 0 min  Stress: Not on file  Social Connections: Not on file  Intimate Partner Violence: Not on file    Review of Systems  All other systems reviewed and are negative.       Objective    BP 111/72 (BP Location: Left Arm, Cuff Size: Large)   Pulse 70   Temp 98.1  F (36.7 C) (Oral)   Ht 5\' 7"  (1.702 m)   Wt (!) 322 lb 11.2 oz (146.4 kg)   SpO2 99%   BMI 50.54 kg/m   Physical Exam Constitutional:      Appearance: Normal appearance.  Cardiovascular:     Rate and Rhythm: Regular rhythm.     Pulses: Normal pulses.     Heart sounds: Normal heart sounds. No murmur heard. Pulmonary:     Effort: Pulmonary effort is normal.     Breath sounds: Normal breath sounds.  Musculoskeletal:     Right lower leg: No edema.     Left lower leg: No edema.  Skin:    General: Skin is warm and dry.     Comments: Acanthosis of the posterior neck   Neurological:     Mental Status: She is alert.  Psychiatric:        Mood and Affect: Mood normal.        Behavior: Behavior normal.        Thought Content: Thought content normal.         Assessment & Plan:   Problem List  Items Addressed This Visit       Other   Hyperlipidemia    Last lipid panel in 2019 showed high LDL of 111 and low HDL of 35. In the setting of extensive family history of hyperlipidemia , CVD, and personal history of obesity will recheck lipid panel  Today. LDL goal of <100 through lifestyle modification.      Relevant Orders   Lipid Profile   Contraception management    Patient with Nexplanon placed in 2018 now with recurrence of irregular menstruation. Wants to get retrieval of the device and new contraception but unable to reach the placing provider. Will refer to gynecology for removal and new contraception management.      Stress incontinence in female    G1P1 patient describes urinary leakage with laughing and sneezing. Denies overflow or urgency. She gave natural birth that required assistive devices. This is consistent with stress incontinence related to childbirth and BMI of 50. Will refer to gynecology for contraception and incontinence management.      Depression    Patient describes on and off periods of depressed mood since the age of 71. She has never been on medical therapy but described unsuccessful therapy sessions at the ages of [age]. She says this may have been due to incompatibility with the therapist. PHQ-9 today was 14 compatible with moderate depression. The patient describes depressed mood, anhedonia, trouble falling asleep, trouble with concentration. 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 Patient will consider role of therapy and follow up.      Atypical chest pain    Patient was recently seen by cardiology for atypical chest pain and palpitations with presyncope. Cardiology ordered PET stress test and Ziopatch, and will follow these result.      Morbid obesity with BMI of 50.0-59.9, adult (HCC)    Patient with BMI of 50.54 today. Has had elevated BP in the hospital and clinic but BP found to be  111/72 on repeat. Recent BMP normal for kidney function and blood glucose levels. She has been referred to healthy weight and wellness clinic by cardiology, will follow up on weight loss goals at next visit.       Daytime somnolence    Patient has day time somnolence, morning headaches, sudden paroxysmal  nocturnal dyspnea and uses 3 pillows to sleep, consistent with OSA in the setting of her weight. Cardiology ordered a sleep study and will follow up on results at next follow up visit.       Return in about 6 months (around 02/23/2022).   Iona Coach, MD

## 2021-08-23 NOTE — Assessment & Plan Note (Signed)
>>  ASSESSMENT AND PLAN FOR DAYTIME SOMNOLENCE WRITTEN ON 08/23/2021 10:39 AM BY SHARL GEE, MD  Patient has day time somnolence, morning headaches, sudden paroxysmal nocturnal dyspnea and uses 3 pillows to sleep, consistent with OSA in the setting of her weight. Cardiology ordered a sleep study and will follow up on results at next follow up visit.

## 2021-08-23 NOTE — Assessment & Plan Note (Signed)
Patient describes on and off periods of depressed mood since the age of 88. She has never been on medical therapy but described unsuccessful therapy sessions at the ages of [age]. She says this may have been due to incompatibility with the therapist. PHQ-9 today was 14 compatible with moderate depression. The patient describes depressed mood, anhedonia, trouble falling asleep, trouble with concentration. 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 Patient will consider role of therapy and follow up.

## 2021-08-23 NOTE — Assessment & Plan Note (Signed)
Patient has day time somnolence, morning headaches, sudden paroxysmal nocturnal dyspnea and uses 3 pillows to sleep, consistent with OSA in the setting of her weight. Cardiology ordered a sleep study and will follow up on results at next follow up visit.

## 2021-08-24 ENCOUNTER — Telehealth: Payer: Self-pay | Admitting: *Deleted

## 2021-08-24 LAB — LIPID PANEL
Chol/HDL Ratio: 5.2 ratio — ABNORMAL HIGH (ref 0.0–4.4)
Cholesterol, Total: 170 mg/dL (ref 100–199)
HDL: 33 mg/dL — ABNORMAL LOW (ref >39)
LDL Chol Calc (NIH): 127 mg/dL — ABNORMAL HIGH (ref 0–99)
Triglycerides: 52 mg/dL (ref 0–149)
VLDL Cholesterol Cal: 10 mg/dL (ref 5–40)

## 2021-08-24 NOTE — Telephone Encounter (Signed)
Prior Authorization for split night sleep study sent to Friday insurance via Fax. Auth received. Auth # [id number]. Valid dates 08/23/21 to 11/23/21.

## 2021-08-25 ENCOUNTER — Telehealth: Payer: Self-pay

## 2021-08-25 NOTE — Telephone Encounter (Signed)
Called patient and discussed results of lipid panel. Will plan to discuss lifestyle vs medication management at 6 month follow up.

## 2021-08-29 NOTE — Progress Notes (Signed)
Internal Medicine Clinic Attending  Case discussed with Dr. Rogers  At the time of the visit.  We reviewed the resident's history and exam and pertinent patient test results.  I agree with the assessment, diagnosis, and plan of care documented in the resident's note.  

## 2021-08-30 ENCOUNTER — Ambulatory Visit (INDEPENDENT_AMBULATORY_CARE_PROVIDER_SITE_OTHER): Payer: 59

## 2021-08-30 DIAGNOSIS — R079 Chest pain, unspecified: Secondary | ICD-10-CM | POA: Diagnosis not present

## 2021-08-30 LAB — ECHOCARDIOGRAM COMPLETE
AR max vel: 3.07 cm2
AV Area VTI: 2.99 cm2
AV Area mean vel: 2.99 cm2
AV Mean grad: 4 mmHg
AV Peak grad: 7.4 mmHg
Ao pk vel: 1.36 m/s
Area-P 1/2: 4.06 cm2
S' Lateral: 2.84 cm

## 2021-08-30 NOTE — Progress Notes (Unsigned)
Patient reported a history of seeing spots for years. After echo, patient sitting up reported the spots were worse and she felt dizzy. Dizziness stopped and vision improved. Offered to take patient to ED. Patient declined. Patient stated she felt better and was ready to leave.  Jeryl Columbia, RDCS, RVT

## 2021-09-05 ENCOUNTER — Emergency Department (HOSPITAL_COMMUNITY)
Admission: EM | Admit: 2021-09-05 | Discharge: 2021-09-05 | Disposition: A | Discharge status: Home / Self Care | Payer: 59 | Attending: Emergency Medicine | Admitting: Emergency Medicine

## 2021-09-05 ENCOUNTER — Other Ambulatory Visit: Payer: Self-pay

## 2021-09-05 ENCOUNTER — Emergency Department (HOSPITAL_BASED_OUTPATIENT_CLINIC_OR_DEPARTMENT_OTHER): Payer: 59

## 2021-09-05 ENCOUNTER — Emergency Department (HOSPITAL_BASED_OUTPATIENT_CLINIC_OR_DEPARTMENT_OTHER)
Admission: EM | Admit: 2021-09-05 | Discharge: 2021-09-05 | Disposition: A | Discharge status: Home / Self Care | Payer: 59 | Source: Home / Self Care | Attending: Emergency Medicine | Admitting: Emergency Medicine

## 2021-09-05 ENCOUNTER — Emergency Department (HOSPITAL_BASED_OUTPATIENT_CLINIC_OR_DEPARTMENT_OTHER): Payer: 59 | Admitting: Radiology

## 2021-09-05 ENCOUNTER — Encounter (HOSPITAL_BASED_OUTPATIENT_CLINIC_OR_DEPARTMENT_OTHER): Payer: Self-pay

## 2021-09-05 DIAGNOSIS — R55 Syncope and collapse: Secondary | ICD-10-CM | POA: Insufficient documentation

## 2021-09-05 LAB — CBC
HCT: 39.7 % (ref 36.0–46.0)
Hemoglobin: 13.4 g/dL (ref 12.0–15.0)
MCH: 32.8 pg (ref 26.0–34.0)
MCHC: 33.8 g/dL (ref 30.0–36.0)
MCV: 97.3 fL (ref 80.0–100.0)
Platelets: 418 10*3/uL — ABNORMAL HIGH (ref 150–400)
RBC: 4.08 MIL/uL (ref 3.87–5.11)
RDW: 12.8 % (ref 11.5–15.5)
WBC: 9.9 10*3/uL (ref 4.0–10.5)
nRBC: 0 % (ref 0.0–0.2)

## 2021-09-05 LAB — BASIC METABOLIC PANEL
Anion gap: 12 (ref 5–15)
BUN: 9 mg/dL (ref 6–20)
CO2: 24 mmol/L (ref 22–32)
Calcium: 9.9 mg/dL (ref 8.9–10.3)
Chloride: 103 mmol/L (ref 98–111)
Creatinine, Ser: 0.86 mg/dL (ref 0.44–1.00)
GFR, Estimated: >60 mL/min (ref >60)
Glucose, Bld: 84 mg/dL (ref 70–99)
Potassium: 3.7 mmol/L (ref 3.5–5.1)
Sodium: 139 mmol/L (ref 135–145)

## 2021-09-05 LAB — HCG, SERUM, QUALITATIVE: Preg, Serum: NEGATIVE

## 2021-09-05 LAB — TROPONIN I (HIGH SENSITIVITY)
Troponin I (High Sensitivity): 4 ng/L (ref <18)
Troponin I (High Sensitivity): 4 ng/L (ref <18)

## 2021-09-05 MED ORDER — DIPHENHYDRAMINE HCL 50 MG/ML IJ SOLN
12.5000 mg | Freq: Once | INTRAMUSCULAR | Status: AC
Start: 2021-09-05 — End: 2021-09-05
  Administered 2021-09-05: 12.5 mg via INTRAVENOUS
  Filled 2021-09-05: qty 1

## 2021-09-05 MED ORDER — ONDANSETRON HCL 4 MG/2ML IJ SOLN
4.0000 mg | Freq: Once | INTRAMUSCULAR | Status: AC
  Administered 2021-09-05: 4 mg via INTRAVENOUS
  Filled 2021-09-05: qty 2

## 2021-09-05 MED ORDER — SODIUM CHLORIDE 0.9 % IV BOLUS
1000.0000 mL | Freq: Once | INTRAVENOUS | Status: AC
  Administered 2021-09-05: 1000 mL via INTRAVENOUS

## 2021-09-05 MED ORDER — KETOROLAC TROMETHAMINE 15 MG/ML IJ SOLN
15.0000 mg | Freq: Once | INTRAMUSCULAR | Status: AC
  Administered 2021-09-05: 15 mg via INTRAVENOUS
  Filled 2021-09-05: qty 1

## 2021-09-05 NOTE — ED Triage Notes (Signed)
Patient here POV from Home.  Endorses being Transported By EMS to Hospital earlier today. Mother who was with the Patient in the Car stated Patient had a Sudden Onset of CP and had a Full LOC. LWBS due to Wait.   No CP Currently. Currently endorses General Malaise.   NAD Noted during Triage. A&Ox4. GCS 15. Ambulatory.
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
## 2021-09-05 NOTE — ED Notes (Signed)
Patient left without being seen, states "We are going to transfer to [facility]"
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
## 2021-09-06 ENCOUNTER — Encounter: Payer: Self-pay | Admitting: Neurology

## 2021-09-13 NOTE — Addendum Note (Signed)
Addended by: Willette Cluster on: 09/13/2021 09:19 AM   Modules accepted: Orders

## 2021-09-15 ENCOUNTER — Telehealth: Payer: Self-pay

## 2021-09-15 NOTE — Telephone Encounter (Signed)
Called re: PREP program referral, received message that she is not accepting calls at this time, no option to leave voicemail

## 2021-09-27 ENCOUNTER — Ambulatory Visit: Payer: Commercial Managed Care - HMO | Admitting: Neurology

## 2021-09-27 ENCOUNTER — Encounter: Payer: Self-pay | Admitting: Neurology

## 2021-09-27 VITALS — BP 139/86 | HR 79 | Ht 67.0 in | Wt 322.0 lb

## 2021-09-27 DIAGNOSIS — G43109 Migraine with aura, not intractable, without status migrainosus: Secondary | ICD-10-CM

## 2021-09-27 DIAGNOSIS — R404 Transient alteration of awareness: Secondary | ICD-10-CM
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
## 2021-09-28 ENCOUNTER — Ambulatory Visit (HOSPITAL_BASED_OUTPATIENT_CLINIC_OR_DEPARTMENT_OTHER): Payer: Commercial Managed Care - HMO | Attending: Cardiology | Admitting: Cardiology

## 2021-09-28 DIAGNOSIS — R0683 Snoring: Secondary | ICD-10-CM | POA: Diagnosis present

## 2021-09-28 DIAGNOSIS — G4733 Obstructive sleep apnea (adult) (pediatric): Secondary | ICD-10-CM | POA: Insufficient documentation

## 2021-09-28 DIAGNOSIS — R400 Somnolence: Secondary | ICD-10-CM

## 2021-09-28 DIAGNOSIS — E669 Obesity, unspecified: Secondary | ICD-10-CM | POA: Diagnosis not present

## 2021-09-29 NOTE — Procedures (Signed)
   Patient Name: Kristin Wright, Sandberg Study Date:09/28/2021 Gender: Female D.O.B: [birth year] Age (years): 36 Referring Provider: Epifanio Lesches Height (inches): 67 Interpreting Physician: Armanda Magic MD, ABSM Weight (lbs): 322 RPSGT: Cherylann Parr BMI: 50 MRN: [id number] Neck Size: 15.00  CLINICAL INFORMATION Sleep Study Type: NPSG  Indication for sleep study: Obesity, Re-Evaluation, Snoring  Epworth Sleepiness Score: 14  SLEEP STUDY TECHNIQUE As per the AASM Manual for the Scoring of Sleep and Associated Events v2.3 (April 2016) with a hypopnea requiring 4% desaturations.  The channels recorded and monitored were frontal, central and occipital EEG, electrooculogram (EOG), submentalis EMG (chin), nasal and oral airflow, thoracic and abdominal wall motion, anterior tibialis EMG, snore microphone, electrocardiogram, and pulse oximetry.  MEDICATIONS Medications self-administered by patient taken the night of the study : N/A  SLEEP ARCHITECTURE The study was initiated at 10:49:49 PM and ended at 4:58:47 AM.  Sleep onset time was 21.5 minutes and the sleep efficiency was 94.0%. The total sleep time was 347 minutes.  Stage REM latency was 55.5 minutes.  The patient spent 0.3% of the night in stage N1 sleep, 79.4% in stage N2 sleep, 0.0% in stage N3 and 20.3% in REM.  Alpha intrusion was absent.  Supine sleep was 86.83%.  RESPIRATORY PARAMETERS The overall apnea/hypopnea index (AHI) was 12.1 per hour. There were 14 total apneas, including 14 obstructive, 0 central and 0 mixed apneas. There were 56 hypopneas and 0 RERAs.  The AHI during Stage REM sleep was 38.3 per hour.  AHI while supine was 13.9 per hour.  The mean oxygen saturation was 92.7%. The minimum SpO2 during sleep was 82.0%.  moderate snoring was noted during this study.  CARDIAC DATA The 2 lead EKG demonstrated sinus rhythm. The mean heart rate was 84.5 beats per minute. Other EKG findings include:  None  LEG MOVEMENT DATA The total PLMS were 0 with a resulting PLMS index of 0.0. Associated arousal with leg movement index was 0.0 .  IMPRESSIONS - Mild obstructive sleep apnea occurred during this study (AHI = 12.1/h). - Mild oxygen desaturation was noted during this study (Min O2 = 82.0%). - The patient snored with moderate snoring volume. - Clinically significant periodic limb movements did not occur during sleep. No significant associated arousals.  DIAGNOSIS - Obstructive Sleep Apnea (G47.33)  RECOMMENDATIONS - Therapeutic CPAP titration to determine optimal pressure required to alleviate sleep disordered breathing. - Positional therapy avoiding supine position during sleep. - Avoid alcohol, sedatives and other CNS depressants that may worsen sleep apnea and disrupt normal sleep architecture. - Sleep hygiene should be reviewed to assess factors that may improve sleep quality. - Weight management and regular exercise should be initiated or continued if appropriate.  [Electronically signed] 09/29/2021 02:39 PM  Armanda Magic MD, ABSM Diplomate, American Board of Sleep Medicine

## 2021-10-04 ENCOUNTER — Encounter: Payer: 59 | Admitting: Dietician

## 2021-10-16 ENCOUNTER — Telehealth: Payer: Self-pay | Admitting: *Deleted

## 2021-10-16 DIAGNOSIS — G4733 Obstructive sleep apnea (adult) (pediatric): Secondary | ICD-10-CM

## 2021-10-16 DIAGNOSIS — R400 Somnolence: Secondary | ICD-10-CM

## 2021-10-16 DIAGNOSIS — R0683 Snoring: Secondary | ICD-10-CM

## 2021-10-16 NOTE — Telephone Encounter (Signed)
-----   Message from Lattie Haw, RN sent at 09/30/2021  4:02 PM EDT -----  ----- Message ----- From: Quintella Reichert, MD Sent: 09/29/2021   2:43 PM EDT To: Cv Div Sleep Studies  Please let patient know that they have sleep apnea.  Recommend therapeutic CPAP titration for treatment of patient's sleep disordered breathing.  If unable to perform an in lab titration then initiate ResMed auto CPAP from 4 to 15cm H2O with heated humidity and mask of choice and overnight pulse ox on CPAP.

## 2021-10-16 NOTE — Telephone Encounter (Signed)
The patient has been notified of the result and verbalized understanding.  All questions (if any) were answered. Latrelle Dodrill, CMA 10/16/2021 4:26 PM    Precert titration

## 2021-11-14 ENCOUNTER — Ambulatory Visit (HOSPITAL_COMMUNITY)
Admission: RE | Admit: 2021-11-14 | Discharge: 2021-11-14 | Disposition: A | Discharge status: Home / Self Care | Payer: Commercial Managed Care - HMO | Source: Ambulatory Visit | Attending: Cardiology | Admitting: Cardiology

## 2021-11-14 DIAGNOSIS — G43109 Migraine with aura, not intractable, without status migrainosus: Secondary | ICD-10-CM | POA: Diagnosis not present

## 2021-11-14 DIAGNOSIS — R404 Transient alteration of awareness: Secondary | ICD-10-CM | POA: Insufficient documentation

## 2021-11-14 NOTE — Progress Notes (Signed)
EEG complete - results pending 

## 2021-11-15 ENCOUNTER — Encounter (HOSPITAL_BASED_OUTPATIENT_CLINIC_OR_DEPARTMENT_OTHER): Payer: Self-pay | Admitting: Emergency Medicine

## 2021-11-15 ENCOUNTER — Emergency Department (HOSPITAL_BASED_OUTPATIENT_CLINIC_OR_DEPARTMENT_OTHER)
Admission: EM | Admit: 2021-11-15 | Discharge: 2021-11-15 | Disposition: A | Discharge status: Home / Self Care | Payer: Commercial Managed Care - HMO | Attending: Emergency Medicine | Admitting: Emergency Medicine

## 2021-11-15 ENCOUNTER — Other Ambulatory Visit: Payer: Self-pay

## 2021-11-15 ENCOUNTER — Emergency Department (HOSPITAL_BASED_OUTPATIENT_CLINIC_OR_DEPARTMENT_OTHER): Payer: Commercial Managed Care - HMO | Admitting: Radiology

## 2021-11-15 DIAGNOSIS — H53149 Visual discomfort, unspecified: Secondary | ICD-10-CM | POA: Diagnosis not present

## 2021-11-15 DIAGNOSIS — R42 Dizziness and giddiness: Secondary | ICD-10-CM | POA: Diagnosis not present

## 2021-11-15 DIAGNOSIS — R55 Syncope and collapse: Secondary | ICD-10-CM | POA: Diagnosis not present

## 2021-11-15 DIAGNOSIS — R112 Nausea with vomiting, unspecified: Secondary | ICD-10-CM

## 2021-11-15 DIAGNOSIS — Z20822 Contact with and (suspected) exposure to covid-19: Secondary | ICD-10-CM | POA: Diagnosis not present

## 2021-11-15 DIAGNOSIS — R0602 Shortness of breath: Secondary | ICD-10-CM | POA: Diagnosis not present

## 2021-11-15 DIAGNOSIS — R079 Chest pain, unspecified: Secondary | ICD-10-CM | POA: Diagnosis present

## 2021-11-15 DIAGNOSIS — R519 Headache, unspecified: Secondary | ICD-10-CM | POA: Diagnosis not present

## 2021-11-15 LAB — COMPREHENSIVE METABOLIC PANEL
ALT: 14 U/L (ref 0–44)
AST: 15 U/L (ref 15–41)
Albumin: 4.2 g/dL (ref 3.5–5.0)
Alkaline Phosphatase: 54 U/L (ref 38–126)
Anion gap: 9 (ref 5–15)
BUN: 13 mg/dL (ref 6–20)
CO2: 26 mmol/L (ref 22–32)
Calcium: 9.3 mg/dL (ref 8.9–10.3)
Chloride: 104 mmol/L (ref 98–111)
Creatinine, Ser: 0.8 mg/dL (ref 0.44–1.00)
GFR, Estimated: >60 mL/min (ref >60)
Glucose, Bld: 89 mg/dL (ref 70–99)
Potassium: 4 mmol/L (ref 3.5–5.1)
Sodium: 139 mmol/L (ref 135–145)
Total Bilirubin: 0.4 mg/dL (ref 0.3–1.2)
Total Protein: 7.4 g/dL (ref 6.5–8.1)

## 2021-11-15 LAB — CBC WITH DIFFERENTIAL/PLATELET
Abs Immature Granulocytes: 0.01 10*3/uL (ref 0.00–0.07)
Basophils Absolute: 0 10*3/uL (ref 0.0–0.1)
Basophils Relative: 0 %
Eosinophils Absolute: 0.2 10*3/uL (ref 0.0–0.5)
Eosinophils Relative: 3 %
HCT: 39.4 % (ref 36.0–46.0)
Hemoglobin: 13 g/dL (ref 12.0–15.0)
Immature Granulocytes: 0 %
Lymphocytes Relative: 42 %
Lymphs Abs: 3 10*3/uL (ref 0.7–4.0)
MCH: 32.9 pg (ref 26.0–34.0)
MCHC: 33 g/dL (ref 30.0–36.0)
MCV: 99.7 fL (ref 80.0–100.0)
Monocytes Absolute: 0.4 10*3/uL (ref 0.1–1.0)
Monocytes Relative: 6 %
Neutro Abs: 3.5 10*3/uL (ref 1.7–7.7)
Neutrophils Relative %: 49 %
Platelets: 325 10*3/uL (ref 150–400)
RBC: 3.95 MIL/uL (ref 3.87–5.11)
RDW: 12.7 % (ref 11.5–15.5)
WBC: 7.1 10*3/uL (ref 4.0–10.5)
nRBC: 0 % (ref 0.0–0.2)

## 2021-11-15 LAB — TROPONIN I (HIGH SENSITIVITY): Troponin I (High Sensitivity): <2 ng/L (ref <18)

## 2021-11-15 LAB — TSH: TSH: 3.481 u[IU]/mL (ref 0.350–4.500)

## 2021-11-15 LAB — SARS CORONAVIRUS 2 BY RT PCR: SARS Coronavirus 2 by RT PCR: NEGATIVE

## 2021-11-15 LAB — HCG, SERUM, QUALITATIVE: Preg, Serum: NEGATIVE

## 2021-11-15 LAB — MAGNESIUM: Magnesium: 1.9 mg/dL (ref 1.7–2.4)

## 2021-11-15 MED ORDER — PROCHLORPERAZINE EDISYLATE 10 MG/2ML IJ SOLN
10.0000 mg | Freq: Once | INTRAMUSCULAR | Status: AC
  Administered 2021-11-15: 10 mg via INTRAVENOUS
  Filled 2021-11-15: qty 2

## 2021-11-15 MED ORDER — SODIUM CHLORIDE 0.9 % IV BOLUS
1000.0000 mL | Freq: Once | INTRAVENOUS | Status: AC
  Administered 2021-11-15: 1000 mL via INTRAVENOUS

## 2021-11-15 MED ORDER — DIPHENHYDRAMINE HCL 50 MG/ML IJ SOLN
50.0000 mg | Freq: Once | INTRAMUSCULAR | Status: AC
  Administered 2021-11-15: 50 mg via INTRAVENOUS
  Filled 2021-11-15: qty 1

## 2021-11-15 NOTE — Discharge Instructions (Addendum)
Your history, exam, work-up today were overall reassuring.  The chest x-ray and labs did not show any concerning findings as we discussed together.  I suspect you had a migraine type headache similar to prior with the floaters and led to your episode of near syncope and lightheadedness.  After the fluids and medicines your symptoms had resolved with no further neurologic complaints.  Please continue on your diagnostic journey with all of your specialists with the cardiologist, neurologist, and PCP to determine the cause of your chronic troubles.  If any symptoms change or worsen acutely, please return to the nearest emergency department.  Please rest and stay hydrated.

## 2021-11-15 NOTE — ED Triage Notes (Signed)
Pt states she felt dizzy at work (is [employer]), then bp was 130's /101's, sweaty, nauseated and vomiting x3. Feels dizzy constantly. These are the same symptoms that she is being seen for by a neurologist.

## 2021-11-15 NOTE — ED Notes (Signed)
Dc instructions reviewed with patient. Patient voiced understanding. Dc with belongings.  °

## 2021-11-15 NOTE — ED Notes (Signed)
Patient transported to X-ray 

## 2021-11-15 NOTE — ED Provider Notes (Signed)
[facility] EMERGENCY DEPT Provider Note   CSN: [id number] Arrival date & time: 11/15/21  5329     History  Chief Complaint  Patient presents with   Dizziness    Kristin Wright is a 36 y.o. female.  The history is provided by the patient, medical records, a relative and a parent. No language interpreter was used.  Dizziness Quality:  Lightheadedness Severity:  Severe Onset quality:  Gradual Timing:  Sporadic Progression:  Waxing and waning Chronicity:  Chronic Relieved by:  Nothing Worsened by:  Nothing Ineffective treatments:  None tried Associated symptoms: chest pain, headaches, nausea, palpitations, shortness of breath, vision changes and vomiting   Associated symptoms: no diarrhea and no weakness        Home Medications Prior to Admission medications   Not on File      Allergies    Aspirin, Milk-related compounds, Penicillins, Soy allergy, and Yeast-related products    Review of Systems   Review of Systems  Constitutional:  Positive for diaphoresis and fatigue. Negative for chills and fever.  HENT:  Negative for congestion.   Eyes:  Positive for visual disturbance (floaters with her HA is typical for her).  Respiratory:  Positive for chest tightness and shortness of breath. Negative for cough and wheezing.   Cardiovascular:  Positive for chest pain and palpitations.  Gastrointestinal:  Positive for nausea and vomiting. Negative for abdominal pain, constipation and diarrhea.  Musculoskeletal:  Negative for back pain, neck pain and neck stiffness.  Skin:  Negative for rash and wound.  Neurological:  Positive for dizziness, light-headedness and headaches. Negative for seizures, syncope, weakness and numbness.  Psychiatric/Behavioral:  Negative for agitation and confusion.   All other systems reviewed and are negative.   Physical Exam Updated Vital Signs BP 129/89   Pulse 71   SpO2 100%  Physical Exam Vitals and nursing note reviewed.   Constitutional:      General: She is not in acute distress.    Appearance: She is well-developed. She is not ill-appearing, toxic-appearing or diaphoretic.  HENT:     Head: Normocephalic and atraumatic.     Nose: Nose normal. No congestion or rhinorrhea.     Mouth/Throat:     Mouth: Mucous membranes are dry.     Pharynx: No oropharyngeal exudate or posterior oropharyngeal erythema.  Eyes:     Extraocular Movements: Extraocular movements intact.     Conjunctiva/sclera: Conjunctivae normal.     Pupils: Pupils are equal, round, and reactive to light.  Neck:     Vascular: No carotid bruit.  Cardiovascular:     Rate and Rhythm: Normal rate and regular rhythm.     Heart sounds: No murmur heard. Pulmonary:     Effort: Pulmonary effort is normal. No respiratory distress.     Breath sounds: Normal breath sounds. No wheezing, rhonchi or rales.  Chest:     Chest wall: No tenderness.  Abdominal:     General: Abdomen is flat. There is no distension.     Palpations: Abdomen is soft.     Tenderness: There is no abdominal tenderness. There is no right CVA tenderness, left CVA tenderness, guarding or rebound.  Musculoskeletal:        General: No swelling or tenderness.     Cervical back: Neck supple. No tenderness.     Right lower leg: No edema.     Left lower leg: No edema.  Skin:    General: Skin is warm and dry.  Capillary Refill: Capillary refill takes less than 2 seconds.     Findings: No erythema or rash.  Neurological:     General: No focal deficit present.     Mental Status: She is alert.     Sensory: No sensory deficit.     Motor: No weakness.  Psychiatric:        Mood and Affect: Mood normal.     ED Results / Procedures / Treatments   Labs (all labs ordered are listed, but only abnormal results are displayed) Labs Reviewed  SARS CORONAVIRUS 2 BY RT PCR  URINE CULTURE  HCG, SERUM, QUALITATIVE  CBC WITH DIFFERENTIAL/PLATELET  COMPREHENSIVE METABOLIC PANEL  TSH   MAGNESIUM  URINALYSIS, ROUTINE W REFLEX MICROSCOPIC  TROPONIN I (HIGH SENSITIVITY)  TROPONIN I (HIGH SENSITIVITY)    EKG EKG Interpretation  Date/Time:  Wednesday November 15 2021 08:54:52 EDT Ventricular Rate:  66 PR Interval:  176 QRS Duration: 93 QT Interval:  412 QTC Calculation: 432 R Axis:   49 Text Interpretation: Sinus rhythm when compared to prior, similar appearance. No sTEMI Confirmed by Antony Blackbird [id number]) on 11/15/2021 9:39:05 AM  Radiology DG Chest 2 View  Result Date: 11/15/2021 CLINICAL DATA:  36 year old female with chest pain, shortness of breath, headache, diaphoresis. EXAM: CHEST - 2 VIEW COMPARISON:  Chest radiograph 09/05/2021 and earlier. FINDINGS: Upright AP and lateral views of the chest at 1012 hours. Mildly lower lung volumes. Mediastinal contours remain normal. Visualized tracheal air column is within normal limits. Lungs remain clear. No pneumothorax or pleural effusion. No osseous abnormality identified. Paucity of bowel gas in the visible abdomen. IMPRESSION: No acute cardiopulmonary abnormality. Electronically Signed   By: Genevie Ann M.D.   On: 11/15/2021 10:22    Procedures Procedures    Medications Ordered in ED Medications  sodium chloride 0.9 % bolus 1,000 mL (1,000 mLs Intravenous New Bag/Given 11/15/21 0955)  prochlorperazine (COMPAZINE) injection 10 mg (10 mg Intravenous Given 11/15/21 0949)  diphenhydrAMINE (BENADRYL) injection 50 mg (50 mg Intravenous Given 11/15/21 [id number])    ED Course/ Medical Decision Making/ A&P                           Medical Decision Making Amount and/or Complexity of Data Reviewed Labs: ordered. Radiology: ordered.  Risk Prescription drug management.      Kristin Wright is a 36 y.o. female with a past medical history significant for recurrent syncopal episodes, hyperlipidemia, depression, morbid obesity, daytime somnolence, and recent sleep study showing concern for sleep apnea who presents with a  recurrent episode of near syncope with lightheadedness, headache, floaters in her vision, chest tightness, nausea, vomiting, shortness of breath, and recent urinary frequency and foul smell of the urine.  Patient reports that these are the same symptoms she has been having on and off for months and is currently being worked up by neurology and cardiology.  She reports that she has an MRI coming up soon, and EEG that was recently done, and PET scan next week.  She has multiple follow-up visits and was told she has another confirmatory study to evaluate if she truly has sleep apnea.  She reports that she has had daytime somnolence and fatigue which is the primary thing she is feeling now along with the headache and the mild floaters.  She is not describing any current chest pain abdominal pain shortness of breath, palpitations, and denies feeling nausea at this time.  She does report  she had the symptoms at work this morning prompting her to come back in for evaluation.  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  She denies any constipation or diarrhea.  Does not report any vaginal symptoms otherwise.  On exam, lungs clear and chest nontender.  Abdomen nontender.  No focal neurologic deficits with intact sensation, strength, and pulses in extremities.  Normal finger-nose-finger testing.  Symmetric smile.  Clear speech.  Pupils are symmetric and reactive with normal extract movements.  No carotid bruit.  Patient resting comfortably.  No reported double vision but has chronic floaters in her vision when she has migraine type headaches.  She does report photophobia at this time.  Clinical aspect she has a migrainous type headache triggered by her recurrent episodes of near syncope and lightheadedness that may have been also affected by this suspected new diagnosis of  sleep apnea.  With her new urinary symptoms, make sure she does not UTI and will get some screening labs.  We will give her headache cocktail and fluids and we will get a chest x-ray, COVID test, and make sure there is no other cardiac abnormality.  As she is well on her way to getting some diagnostic clarity with the multiple specialists and upcoming images, we had a shared decision-making conversation and agreed today to hold on more advanced work-up if the work-up is otherwise reassuring and she starts to feel better after her headache cocktail and rehydration.  Anticipate reassessment after work-up.  Work-up was overall reassuring.  Chest x-ray and labs reassuring.  EKG reassuring.  Troponin negative initially.  Patient had complete resolution of her headache and floaters and odd symptoms after the fluids and medications.  Clinically suspect this is a complicated migraine treating her other symptoms.  She reports he is in the midst of her outpatient work-up and agrees with holding on more extensive diagnostic work-up at this time.  Since her symptoms have resolved.  She would like to go home and stay hydrated and rest and follow-up with her different specialty teams.  She understood return precautions and we had a shared decision-making conversation to hold on further work-up.  She no other questions or concerns and was discharged in good condition with resolution of symptoms.        Final Clinical Impression(s) / ED Diagnoses Final diagnoses:  Nonintractable headache, unspecified chronicity pattern, unspecified headache type  Photophobia  Nausea and vomiting, unspecified vomiting type  Near syncope  Chest pain, unspecified type    Rx / DC Orders ED Discharge Orders     None       Clinical Impression: 1. Nonintractable headache, unspecified chronicity pattern, unspecified headache type   2. Photophobia   3. Nausea and vomiting, unspecified vomiting type   4. Near syncope    5. Chest pain, unspecified type     Disposition: Discharge  Condition: Good  I have discussed the results, Dx and Tx plan with the pt(& family if present). He/she/they expressed understanding and agree(s) with the plan. Discharge instructions discussed at great length. Strict return precautions discussed and pt &/or family have verbalized understanding of the instructions. No further questions at time of discharge.    New Prescriptions   No medications on file    Follow Up: please follow up with your PCP, cardiologist, and neurologist  Gloriana Piltz, Gwenyth Allegra, MD 11/15/21 1406

## 2021-11-17 ENCOUNTER — Telehealth (HOSPITAL_COMMUNITY): Payer: Self-pay | Admitting: Emergency Medicine

## 2021-11-17 NOTE — Telephone Encounter (Signed)
Reaching out to patient to offer assistance regarding upcoming cardiac imaging study; pt verbalizes understanding of appt date/time, parking situation and where to check in, pre-test NPO status and medications ordered, and verified current allergies; name and call back number provided for further questions should they arise Elianna Windom RN Navigator Cardiac [facility] [phone] office [phone] cell 

## 2021-11-20 NOTE — Procedures (Signed)
ELECTROENCEPHALOGRAM REPORT  Date of Study: 11/14/2021  Patient's Name: Kristin Wright MRN: [id number] Date of Birth: Mar 09, 1985  Referring Provider: Dr. Ellouise Newer  Clinical History: This is a 36 year old woman with episodes of loss of consciousness. EEG for classification.  Medications: none  Technical Summary: A multichannel digital EEG recording measured by the international 10-20 system with electrodes applied with paste and impedances below 5000 ohms performed in our laboratory with EKG monitoring in an awake and drowsy patient.  Hyperventilation and photic stimulation were performed.  The digital EEG was referentially recorded, reformatted, and digitally filtered in a variety of bipolar and referential montages for optimal display.    Description: The patient is awake and drowsy during the recording.  During maximal wakefulness, there is a symmetric, medium voltage 9 Hz posterior dominant rhythm that attenuates with eye opening.  The record is symmetric.  During drowsiness and sleep, there is an increase in theta slowing of the background.  Vertex waves and symmetric sleep spindles were seen. Hyperventilation and photic stimulation did not elicit any abnormalities.  There were no epileptiform discharges or electrographic seizures seen.    EKG lead was unremarkable.  Impression: This awake and drowsy EEG is normal.    Clinical Correlation: A normal EEG does not exclude a clinical diagnosis of epilepsy.  If further clinical questions remain, prolonged EEG may be helpful.  Clinical correlation is advised.   Ellouise Newer, M.D.

## 2021-11-21 ENCOUNTER — Telehealth: Payer: Self-pay

## 2021-11-21 ENCOUNTER — Other Ambulatory Visit: Payer: Self-pay

## 2021-11-21 ENCOUNTER — Encounter (HOSPITAL_COMMUNITY): Admission: RE | Admit: 2021-11-21 | Payer: Commercial Managed Care - HMO | Source: Ambulatory Visit

## 2021-11-21 ENCOUNTER — Other Ambulatory Visit (HOSPITAL_COMMUNITY): Payer: Commercial Managed Care - HMO

## 2021-11-21 DIAGNOSIS — R404 Transient alteration of awareness: Secondary | ICD-10-CM

## 2021-11-21 NOTE — Telephone Encounter (Signed)
Pt called an informed that EEG is normal however it is not like a pregnancy test that is positive or negative, just a snapshot of her brain waves during the test. Proceed with 3-day EEG as discussed. Amb EEG ordered

## 2021-11-21 NOTE — Telephone Encounter (Signed)
-----   Message from Cameron Sprang, MD sent at 11/21/2021  1:46 PM EDT ----- Pls let her know the EEG is normal however it is not like a pregnancy test that is positive or negative, just a snapshot of her brain waves during the test. Proceed with 3-day EEG as discussed, thanks

## 2021-11-22 ENCOUNTER — Ambulatory Visit: Payer: Commercial Managed Care - HMO | Admitting: Cardiology

## 2021-11-24 ENCOUNTER — Telehealth: Payer: Self-pay | Admitting: Cardiology

## 2021-11-24 NOTE — Telephone Encounter (Signed)
Kristin Wright with [employer] is following up regarding an appeal for CT. She states the panel met today and they are upholding the denial. They plan to mail a letter to the patient outlining the reasoning for the denial.

## 2021-11-24 NOTE — Telephone Encounter (Signed)
Unable to reach [place] or leave a message. Will make dr Gardiner Rhyme aware.

## 2021-11-27 NOTE — Telephone Encounter (Signed)
Was PET denied?

## 2021-12-07 ENCOUNTER — Other Ambulatory Visit: Payer: Commercial Managed Care - HMO | Admitting: Neurology

## 2021-12-07 ENCOUNTER — Ambulatory Visit (HOSPITAL_BASED_OUTPATIENT_CLINIC_OR_DEPARTMENT_OTHER): Payer: Commercial Managed Care - HMO | Attending: Cardiology | Admitting: Cardiology

## 2021-12-07 VITALS — Ht 67.0 in | Wt 330.0 lb

## 2021-12-07 DIAGNOSIS — G4733 Obstructive sleep apnea (adult) (pediatric): Secondary | ICD-10-CM | POA: Insufficient documentation

## 2021-12-10 NOTE — Procedures (Signed)
   Patient Name: Kristin Wright, [place] Study Date: 37/85/8850 Gender: Female D.O.B: 07/11/1985 Age (years): 36 Referring Provider: Oswaldo Milian Height (inches): 61 Interpreting Physician: Fransico Him MD, ABSM Weight (lbs): 330 RPSGT: Jorge Ny BMI: 52 MRN: [id number] Neck Size: 15.00  CLINICAL INFORMATION The patient is referred for a CPAP titration to treat sleep apnea.  SLEEP STUDY TECHNIQUE As per the AASM Manual for the Scoring of Sleep and Associated Events v2.3 (April 2016) with a hypopnea requiring 4% desaturations.  The channels recorded and monitored were frontal, central and occipital EEG, electrooculogram (EOG), submentalis EMG (chin), nasal and oral airflow, thoracic and abdominal wall motion, anterior tibialis EMG, snore microphone, electrocardiogram, and pulse oximetry. Continuous positive airway pressure (CPAP) was initiated at the beginning of the study and titrated to treat sleep-disordered breathing.  MEDICATIONS Medications self-administered by patient taken the night of the study : N/A  TECHNICIAN COMMENTS Comments added by technician: Patient was restless all through the night. Comments added by scorer: N/A  RESPIRATORY PARAMETERS Optimal PAP Pressure (cm): 10  AHI at Optimal Pressure (/hr):3.5 Overall Minimal O2 (%):88.0  Supine % at Optimal Pressure (%):N/A Minimal O2 at Optimal Pressure (%): 91.0   SLEEP ARCHITECTURE The study was initiated at 11:04:14 PM and ended at 5:26:42 AM.  Sleep onset time was 17.7 minutes and the sleep efficiency was 88.1%. The total sleep time was 337 minutes.  The patient spent 4.0% of the night in stage N1 sleep, 73.9% in stage N2 sleep, 0.0% in stage N3 and 22.1% in REM.Stage REM latency was 96.5 minutes  Wake after sleep onset was 27.8. Alpha intrusion was absent. Supine sleep was 49.07%.  CARDIAC DATA The 2 lead EKG demonstrated sinus rhythm. The mean heart rate was 81.2 beats per minute. Other EKG  findings include: None.  LEG MOVEMENT DATA The total Periodic Limb Movements of Sleep (PLMS) were 0. The PLMS index was 0.0. A PLMS index of <15 is considered normal in adults.  IMPRESSIONS - An optimal PAP pressure could not be selected for this patient based on the available study data. - Central sleep apnea was not noted during this titration (CAI = 0.4/h). - Mild oxygen desaturations were observed during this titration (min O2 = 88.0%). - The patient snored with soft snoring volume during this titration study. - No cardiac abnormalities were observed during this study. - Clinically significant periodic limb movements were not noted during this study. Arousals associated with PLMs were significant.  DIAGNOSIS - Obstructive Sleep Apnea (G47.33)  RECOMMENDATIONS - Recommend a trial of ResMed Auto-CPAP from 4 to 15cm H2O with heated humidity and mask of choice.  - Avoid alcohol, sedatives and other CNS depressants that may worsen sleep apnea and disrupt normal sleep architecture. - Sleep hygiene should be reviewed to assess factors that may improve sleep quality. - Weight management and regular exercise should be initiated or continued. - Return to [facility] Center for re-evaluation after 6 weeks of therapy  [Electronically signed] 12/10/2021 10:14 PM  Fransico Him MD, ABSM Diplomate, American Board of Sleep Medicine

## 2021-12-12 ENCOUNTER — Encounter (HOSPITAL_COMMUNITY): Admission: RE | Admit: 2021-12-12 | Payer: Commercial Managed Care - HMO | Source: Ambulatory Visit

## 2021-12-18 ENCOUNTER — Encounter: Payer: Self-pay | Admitting: Dietician

## 2021-12-18 ENCOUNTER — Ambulatory Visit (INDEPENDENT_AMBULATORY_CARE_PROVIDER_SITE_OTHER): Payer: Commercial Managed Care - HMO

## 2021-12-18 ENCOUNTER — Ambulatory Visit: Payer: Commercial Managed Care - HMO | Admitting: Dietician

## 2021-12-18 DIAGNOSIS — G4733 Obstructive sleep apnea (adult) (pediatric): Secondary | ICD-10-CM

## 2021-12-18 DIAGNOSIS — F17210 Nicotine dependence, cigarettes, uncomplicated: Secondary | ICD-10-CM | POA: Diagnosis not present

## 2021-12-18 DIAGNOSIS — Z720 Tobacco use: Secondary | ICD-10-CM | POA: Insufficient documentation

## 2021-12-18 MED ORDER — NICOTINE 14 MG/24HR TD PT24: 14.0000 mg | MEDICATED_PATCH | TRANSDERMAL | 0 refills | Status: AC

## 2021-12-18 NOTE — Assessment & Plan Note (Signed)
Patient with recent sleep study diagnostic of OSA. Recommended Auto-CPAP from 4-15cm H20. -DME cpap ordered

## 2021-12-18 NOTE — Progress Notes (Signed)
   I connected with  Kristin Wright on 33/54/56 by telephone and verified that I am speaking with the correct person using two identifiers.   I discussed the limitations of evaluation and management by telemedicine. The patient expressed understanding and agreed to proceed.  CC: smoking cessation  This is a telephone encounter between Kristin Wright and Kristin Wright on 12/18/2021 for smoking cessation. The visit was conducted with the patient located at home and Kristin Wright at [facility]. The patient's identity was confirmed using their DOB and current address. The patient has consented to being evaluated through a telephone encounter and understands the associated risks (an examination cannot be done and the patient may need to come in for an appointment) / benefits (allows the patient to remain at home, decreasing exposure to coronavirus). I personally spent 15 minutes on medical discussion.   HPI:  Kristin Wright is a 36 y.o. with PMH as below.   Please see A&P for assessment of the patient's acute and chronic medical conditions.   Past Medical History:  Diagnosis Date   Fainting 2004   triggered by bright lights   Review of Systems:  Review of systems otherwise negative  Assessment & Plan:   OSA (obstructive sleep apnea) Patient with recent sleep study diagnostic of OSA. Recommended Auto-CPAP from 4-15cm H20. -DME cpap ordered  Tobacco use Patient is currently smoking 5 cigarettes daily. Will start her on nicotine patches for now. Is not currently a candidate for chantix as she has a stop date of December 31st 2023 and a stop day 8-31 days after starting chantix is appropriate. Although not likely, currently undergoing seizure work up by neurology so also not a candidate for wellbutrin at this time. Will schedule 4 week follow up and discuss medication options at that time -14mg  nicotine patch daily x 6 weeks, then 7mg  patch x 2 weeks    Patient seen with Dr. Rosemar[facility]  PGY-1 Phone: [phone] Date 12/18/2021  Time 7:29 PM

## 2021-12-18 NOTE — Assessment & Plan Note (Signed)
Patient is currently smoking 5 cigarettes daily. Will start her on nicotine patches for now. Is not currently a candidate for chantix as she has a stop date of December 31st 2023 and a stop day 8-31 days after starting chantix is appropriate. Although not likely, currently undergoing seizure work up by neurology so also not a candidate for wellbutrin at this time. Will schedule 4 week follow up and discuss medication options at that time -14mg  nicotine patch daily x 6 weeks, then 7mg  patch x 2 weeks

## 2021-12-18 NOTE — Progress Notes (Deleted)
HLD Last lipid panel in 2019 showed high LDL of 111 and low HDL of 35. In the setting of extensive family history of hyperlipidemia , CVD, and personal history of obesity, rechecked lipids and LDL found to be 127. LDL goal of <100 through lifestyle modification. Will consider oral medications at next visit given ASCVD risk of 40% over lifetime.  Stress incontinence contraception Gyn referal  Depression Therapy? Meds?  OSADaytime somnolence Sleep study results?? Recommend a trial of ResMed Auto-CPAP from 4 to 15cm H2O with heated humidity and mask of choice.   Vasovagal syncope Seen by neurology 09/27/2021 Eeg ordered and normal, 3 day ordered Mri orderd  Tobacco cessation 5 cigarettes per day

## 2021-12-18 NOTE — Progress Notes (Signed)
1416 PM to 1435 PM Spoke with Ms. Hougland about her referral for weight management/obesity. She is understanding that her insurance coverage needs to be verified. She says she is also in the process of getting a CPAP.   Plan to to speak again in two weeks about coverage.  Debera Lat, RD 12/18/2021 2:38 PM.

## 2021-12-20 ENCOUNTER — Ambulatory Visit: Payer: Commercial Managed Care - HMO | Admitting: Cardiology

## 2021-12-20 ENCOUNTER — Telehealth: Payer: Self-pay | Admitting: *Deleted

## 2021-12-20 NOTE — Telephone Encounter (Signed)
The patient has been notified of the result and verbalized understanding.  All questions (if any) were answered. Kristin Wright, [place] 12/20/2021 6:40 PM    Upon patient request DME selection is [facility]. Patient understands he will be contacted by [facility] to set up his cpap. Patient understands to call if [facility] does not contact him with new setup in a timely manner. Patient understands they will be called once confirmation has been received from [employer]/ that they have received their new machine to schedule 10 week follow up appointment.   [facility] notified of new cpap order  Please add to airview Patient was grateful for the call and thanked me.

## 2021-12-20 NOTE — Telephone Encounter (Signed)
-----   Message from Lauralee Evener, [place] sent at 12/11/2021  8:19 AM EDT -----  ----- Message ----- From: Sueanne Margarita, MD Sent: 12/10/2021  10:16 PM EDT To: Cv Div Sleep Studies  Please let patient know that they had a successful PAP titration and let DME know that orders are in EPIC.  Please set up 6 week OV with me.

## 2021-12-21 ENCOUNTER — Telehealth: Payer: Self-pay

## 2021-12-21 NOTE — Telephone Encounter (Signed)
Patient sent message via my chart about scheduling an appointment.

## 2021-12-21 NOTE — Telephone Encounter (Signed)
-----   Message from Iona Coach, MD sent at 12/18/2021  7:28 PM EDT ----- Could we please get this patient scheduled for follow up in 4 weeks.

## 2021-12-24 NOTE — Progress Notes (Signed)
Internal Medicine Clinic Attending  Case discussed with Dr. Rogers  at the time of the visit.  We reviewed the resident's history and pertinent patient test results.  I agree with the assessment, diagnosis, and plan of care documented in the resident's note.  

## 2021-12-25 ENCOUNTER — Ambulatory Visit: Payer: Commercial Managed Care - HMO | Admitting: Orthopedic Surgery

## 2021-12-27 ENCOUNTER — Ambulatory Visit (INDEPENDENT_AMBULATORY_CARE_PROVIDER_SITE_OTHER): Payer: Commercial Managed Care - HMO | Admitting: Neurology

## 2021-12-27 DIAGNOSIS — R404 Transient alteration of awareness: Secondary | ICD-10-CM | POA: Diagnosis not present

## 2022-01-01 ENCOUNTER — Encounter: Payer: Self-pay | Admitting: Dietician

## 2022-01-17 ENCOUNTER — Encounter: Payer: Commercial Managed Care - HMO | Admitting: Student
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
## 2022-01-22 ENCOUNTER — Ambulatory Visit: Payer: Commercial Managed Care - HMO | Attending: Cardiology | Admitting: Cardiology

## 2022-01-22 NOTE — Patient Instructions (Signed)
Zo patch  Patient had a min HR of 49 bpm, max HR of 155 bpm, and avg HR of 91 bpm. Predominant underlying rhythm was Sinus Rhythm. Isolated SVEs were rare (<1.0%), and no SVE Couplets or SVE Triplets were present. Isolated VEs were rare (<1.0%), and no VE Couplets  or VE Triplets were present.  No patient triggered events   OSA  Tobacco use

## 2022-01-23 ENCOUNTER — Ambulatory Visit (INDEPENDENT_AMBULATORY_CARE_PROVIDER_SITE_OTHER): Payer: Commercial Managed Care - HMO | Admitting: Student

## 2022-01-23 ENCOUNTER — Encounter: Payer: Self-pay | Admitting: Student

## 2022-01-23 VITALS — BP 106/79 | HR 89 | Temp 98.1°F | Ht 67.0 in | Wt 329.3 lb

## 2022-01-23 DIAGNOSIS — Z6841 Body Mass Index (BMI) 40.0 and over, adult: Secondary | ICD-10-CM

## 2022-01-23 DIAGNOSIS — R55 Syncope and collapse: Secondary | ICD-10-CM

## 2022-01-23 DIAGNOSIS — E785 Hyperlipidemia, unspecified: Secondary | ICD-10-CM

## 2022-01-23 DIAGNOSIS — Z720 Tobacco use: Secondary | ICD-10-CM

## 2022-01-23 DIAGNOSIS — F32A Depression, unspecified: Secondary | ICD-10-CM | POA: Diagnosis not present

## 2022-01-23 DIAGNOSIS — G4733 Obstructive sleep apnea (adult) (pediatric): Secondary | ICD-10-CM

## 2022-01-23 DIAGNOSIS — Z131 Encounter for screening for diabetes mellitus: Secondary | ICD-10-CM

## 2022-01-23 LAB — POCT GLYCOSYLATED HEMOGLOBIN (HGB A1C): Hemoglobin A1C: 5.4 % (ref 4.0–5.6)

## 2022-01-23 LAB — GLUCOSE, CAPILLARY: Glucose-Capillary: 101 mg/dL — ABNORMAL HIGH (ref 70–99)

## 2022-01-23 MED ORDER — CITALOPRAM HYDROBROMIDE 20 MG PO TABS: 20.0000 mg | ORAL_TABLET | Freq: Every day | ORAL | 3 refills | Status: DC

## 2022-01-23 NOTE — Assessment & Plan Note (Signed)
Patient's lipid panel with LDL of 124 and t cholesterol <200. ASCVD unable to calculate because patients age <40. She is currently a smoker , however, patient is not currently diagnosed with diabetes, HTN, or has a family history of early major cardiovascular events.  -Continue to monitor
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
## 2022-01-23 NOTE — Assessment & Plan Note (Signed)
Patient returns to clinic for follow up. Repeat PHQ9 score 14 with low risk for suicide today per screening. Her depression and axiety have led her to surrender care of her child as it is often difficult to manage the daily stressors of work and life in general. Patient was tearful during OV and reported being ready to attempt medication again. She recalls trying Zoloft in the past but she did not tolerate GI and insomnia like side effects. Plan: -Start Celexa 20 mg daily -Will have patient return in 2 weeks for monitoring side effect profile of this medication. Patient is aware it make take up to 6 weeks for her to see positive effects from therapy -Amb referral to behavioral health as patient is amenable to working with therapist

## 2022-01-23 NOTE — Assessment & Plan Note (Signed)
Patient continues to have difficulty with nutrition management and weight gain. Suspect untreated OSA, anxiety and depression is contributory. Patient is now seeking treatment for Anxiety and depression and is in the process of obstaining CPAP machine, which would help her.   Discuss need for lifestyle modifications. She was previously referred to PREP exercise program but lost connection with them. TSH tested in the past was wnl. Diabetes screen today with A1c at 5.4 %. BP wnl -Ambulatory referral to Nutrition management -Ambulatory referral to exercise program -Consider discussing and referral to bariatric surgery as patient's BMI >40.

## 2022-01-23 NOTE — Assessment & Plan Note (Addendum)
Sleep study with OSA diagnosis. Auto-Pap recommmendations: 4-15 cm H2O. DME order places by Dr. Aundria Rud during last OV. Having difficulty accesing CPAP 2/2 unable to pay for co-pay. Connected her with our referral coordinator who instructed a referral for DME to be placed instead to allow for case management. -Will continue to follow

## 2022-01-23 NOTE — Assessment & Plan Note (Addendum)
Patient reports she has been under stress and has continued to smoke. She was nervous to use the nicotine patches prescribed during last visit. Counseled that smoking is likely worsening her anxiety symptoms. Explained how the nicotine patches work and how they'll help with cravings. -Patient will attempt nicotine patches -continue counseling tobacco use cessation at follow up

## 2022-01-24 NOTE — Progress Notes (Signed)
Internal Medicine Clinic Attending  I saw and evaluated the patient.  I personally confirmed the key portions of the history and exam documented by Dr. Gomez-Caraballo and I reviewed pertinent patient test results.  The assessment, diagnosis, and plan were formulated together and I agree with the documentation in the resident's note.  
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
## 2022-01-29 ENCOUNTER — Encounter: Payer: Self-pay | Admitting: Cardiology

## 2022-01-31 ENCOUNTER — Telehealth: Payer: Self-pay

## 2022-01-31 NOTE — Telephone Encounter (Signed)
-----   Message from Van Clines, MD sent at 01/31/2022  3:40 PM EST ----- Pls let her know the prolonged EEG was normal. Proceed with brain MRI as discussed, thanks

## 2022-01-31 NOTE — Telephone Encounter (Signed)
Pt called informed that prolonged EEG was normal. Proceed with brain MRI as discussed

## 2022-02-02 ENCOUNTER — Telehealth: Payer: Self-pay

## 2022-02-02 NOTE — Telephone Encounter (Signed)
Called to discuss PREP program; she prefers Kristin Wright, works 2 jobs, but would like to attend Jan 15 class, every M/W 3:30-4:45; will contact first week of January to schedule assessment visit.

## 2022-02-06 ENCOUNTER — Encounter: Payer: Self-pay | Admitting: Student

## 2022-02-06 ENCOUNTER — Ambulatory Visit (INDEPENDENT_AMBULATORY_CARE_PROVIDER_SITE_OTHER): Payer: Commercial Managed Care - HMO | Admitting: Student

## 2022-02-06 VITALS — BP 139/107 | HR 91 | Ht 67.0 in | Wt 324.6 lb

## 2022-02-06 DIAGNOSIS — F418 Other specified anxiety disorders: Secondary | ICD-10-CM | POA: Diagnosis not present

## 2022-02-06 DIAGNOSIS — F419 Anxiety disorder, unspecified: Secondary | ICD-10-CM

## 2022-02-06 DIAGNOSIS — F32A Depression, unspecified: Secondary | ICD-10-CM

## 2022-02-06 MED ORDER — HYDROXYZINE HCL 25 MG PO TABS: 25.0000 mg | ORAL_TABLET | Freq: Three times a day (TID) | ORAL | 0 refills | Status: DC | PRN

## 2022-02-06 MED ORDER — PROPRANOLOL HCL 10 MG PO TABS: 10.0000 mg | ORAL_TABLET | Freq: Three times a day (TID) | ORAL | 0 refills | Status: DC | PRN

## 2022-02-06 NOTE — Patient Instructions (Signed)
Please continue taking your celexa. You can also use propranolol to help with your anxiety before your presentations.

## 2022-02-07 ENCOUNTER — Encounter: Payer: Self-pay | Admitting: Student

## 2022-02-07 DIAGNOSIS — R55 Syncope and collapse: Secondary | ICD-10-CM | POA: Insufficient documentation

## 2022-02-07 DIAGNOSIS — F419 Anxiety disorder, unspecified: Secondary | ICD-10-CM | POA: Insufficient documentation

## 2022-02-07 NOTE — Assessment & Plan Note (Signed)
Patient states that her anxiety is situational when she speaks in front of large crowds. She is willing to trial propranolol to see if this helps with her symptoms.

## 2022-02-07 NOTE — Assessment & Plan Note (Signed)
Patient's depression remains uncontrolled. Phq9 is 18 today and it was 14 two weeks ago. She was recently started on citalopram and is aware that it takes 4-6 weeks to see a benefit. She would like to continue with her current medication as she is tolerating this well.
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
## 2022-02-08 NOTE — Progress Notes (Signed)
Internal Medicine Clinic Attending  Case discussed with Dr. Carter  At the time of the visit.  We reviewed the resident's history and exam and pertinent patient test results.  I agree with the assessment, diagnosis, and plan of care documented in the resident's note.  

## 2022-02-22 ENCOUNTER — Telehealth: Payer: Self-pay

## 2022-02-22 NOTE — Telephone Encounter (Signed)
Called to confirm participation in next PREP class at [facility] on 03/05/22 and set up assessment visit; left voicemail

## 2022-02-27 ENCOUNTER — Encounter: Payer: Commercial Managed Care - HMO | Admitting: Student

## 2022-03-02 ENCOUNTER — Encounter: Payer: Commercial Managed Care - HMO | Admitting: Student

## 2022-03-21 ENCOUNTER — Ambulatory Visit: Payer: Commercial Managed Care - HMO | Admitting: Registered"

## 2022-04-19 ENCOUNTER — Ambulatory Visit: Payer: Commercial Managed Care - HMO | Admitting: Dietician

## 2022-06-13 ENCOUNTER — Encounter: Payer: Commercial Managed Care - HMO | Admitting: Internal Medicine

## 2022-06-13 NOTE — Progress Notes (Deleted)
   CC: ***  HPI:[name] is a 37 y.o. female who presents for evaluation of ***. Please see individual problem based A/P for details.  ***  Painful knot Present 1 week. Limited rom due to pain. Hot compresses not working - cyst, vs abscess vs LN, vs lipoma vs HS, ingrown hair? - [place]   Depression, PHQ-9: Based on the patients  Flowsheet Row Office Visit from 02/06/2022 in [facility]  PHQ-9 Total Score 18      score we have ***.  Past Medical History:  Diagnosis Date   Anxiety    Fainting 2004   triggered by bright lights   Review of Systems:   ROS   Physical Exam: There were no vitals filed for this visit.   General: *** HEENT: Conjunctiva nl , antiicteric sclerae, moist mucous membranes, no exudate or erythema Cardiovascular: Normal rate, regular rhythm.  No murmurs, rubs, or gallops Pulmonary : Equal breath sounds, No wheezes, rales, or rhonchi Abdominal: soft, nontender,  bowel sounds present Ext: No edema in lower extremities, no tenderness to palpation of lower extremities.   Assessment & Plan:   See Encounters Tab for problem based charting.  Patient {GC/GE:[id number]::"discussed with","seen with"} Dr. {[id number]::"W. Hoffman","Guilloud","Mullen","Narendra","Raines","Vincent","Williams"}

## 2022-07-02 ENCOUNTER — Ambulatory Visit (INDEPENDENT_AMBULATORY_CARE_PROVIDER_SITE_OTHER): Payer: Commercial Managed Care - HMO | Admitting: Physician Assistant

## 2022-07-02 ENCOUNTER — Encounter: Payer: Self-pay | Admitting: Physician Assistant

## 2022-07-02 ENCOUNTER — Other Ambulatory Visit (INDEPENDENT_AMBULATORY_CARE_PROVIDER_SITE_OTHER): Payer: Commercial Managed Care - HMO

## 2022-07-02 DIAGNOSIS — M25562 Pain in left knee: Secondary | ICD-10-CM | POA: Diagnosis not present

## 2022-07-02 MED ORDER — BUPIVACAINE HCL 0.25 % IJ SOLN
2.0000 mL | INTRAMUSCULAR | Status: AC | PRN
  Administered 2022-07-02: 2 mL via INTRA_ARTICULAR

## 2022-07-02 MED ORDER — METHYLPREDNISOLONE ACETATE 40 MG/ML IJ SUSP
80.0000 mg | INTRAMUSCULAR | Status: AC | PRN
  Administered 2022-07-02: 80 mg via INTRA_ARTICULAR

## 2022-07-02 MED ORDER — LIDOCAINE HCL 1 % IJ SOLN
2.0000 mL | INTRAMUSCULAR | Status: AC | PRN
  Administered 2022-07-02: 2 mL

## 2022-07-02 NOTE — Addendum Note (Signed)
Addended by: Elizebeth Brooking on: 07/02/2022 03:29 PM   Modules accepted: Orders

## 2022-07-02 NOTE — Progress Notes (Signed)
Office Visit Note   Patient: Kristin Wright           Date of Birth: [birth year]           MRN: [id number] Visit Date: 07/02/2022              Requested by: Willette Cluster, MD [address] [place],  [place] [address] PCP: Willette Cluster, MD   Assessment & Plan: Visit Diagnoses:  1. Acute pain of left knee     Plan: [name] is a pleasant 37 year old woman who is a patient of Dr. Diamantina Providence.  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  She overall is doing well.  Couple months ago she fell down hard onto both of her knees.  While the right knee still hurts her some it is the left knee that is the most symptomatic.  She denies any paresthesias or weakness.  She states that the pain is most in the anterior knee and laterally.  She does give a history of some catching.  She denies any big effusion or swelling.  She does say that when she tries to walk flat on her foot it shoots pain up into the knee.  Her exam demonstrates some tenderness over the lateral joint line.  Her peroneal posterior tib anterior and posterior tibialis strength is all intact.  Compartments are soft and compressible.  Since it is her knee that is the most symptomatic and she this has been going on for 2 months I consulted Dr. August Saucer who recommended an MRI.  I will also try an intra-articular injection today to see if this gives her some relief.  She should follow-up with Dr. August Saucer once it is complete.  Of course if she has any increasing symptoms such as weakness or paresthesia or increasing pain she is to contact me immediately  Follow-Up Instructions: With Dr. August Saucer after MRI  Orders:  Orders Placed This Encounter  Procedures   XR KNEE 3 VIEW LEFT   No orders of the defined types were placed in this encounter.     Procedures: Large Joint Inj: L knee on 07/02/2022 3:23 PM Indications: pain and diagnostic evaluation Details: 25 G  1.5 in needle, anterolateral approach  Arthrogram: No  Medications: 80 mg methylPREDNISolone acetate 40 MG/ML; 2 mL lidocaine 1 %; 2 mL bupivacaine 0.25 % Outcome: tolerated well, no immediate complications Procedure, treatment alternatives, risks and benefits explained, specific risks discussed. Consent was given by the patient.     Clinical Data: No additional findings.   Subjective: Chief Complaint  Patient presents with   Left Knee - Pain    HPI Patient is a pleasant 37 year old woman who is a patient of Dr. Diamantina Providence.  She is approximately 3 years status post compartment release for exertional called compartment syndrome on her left leg.  She comes in today because she had a fall couple months ago onto both of her knees.  She continues to have left leg and knee pain.  She says it hurts to put her left foot flat and when she does that she gets a sharp shooting pain.  She also says her toes get stiff bothers her to go up and down stairs.  She is taking Tylenol.  Describes the discomfort as moderate Review of Systems  All other systems reviewed and are negative.    Objective: Vital Signs: There were no vitals taken for this  visit.  Physical Exam Constitutional:      Appearance: Normal appearance.  Pulmonary:     Effort: Pulmonary effort is normal.  Skin:    General: Skin is warm and dry.  Neurological:     General: No focal deficit present.     Mental Status: She is alert.    Ortho Exam Left knee no redness no erythema no effusion.  She has some tenderness over the lateral joint line.  This is accentuated with terminal extension.  She can hold her leg at full extension.  She has 5 out of 5 strength with resisted extension and flexion of her leg flexion of her hip.  Dorsiflexion and plantarflexion of her ankle.  Her dorsalis pedis pulse and posterior tibial pulses are intact.  Her foot is warm.  She has sensation intact distally.  With flexion of her ankle she has pain that  shoots up into her knee.  Compartments are soft compressible.  She also has some pain that shoots up the anterior part of her shin though again compartments are soft.  No cellulitis or signs of infection.  No significant swelling. Specialty Comments:  No specialty comments available.  Imaging: No results found.   PMFS History: Patient Active Problem List   Diagnosis Date Noted   Pain in left knee 07/02/2022   Anxiety 02/07/2022   OSA (obstructive sleep apnea) 12/18/2021   Tobacco use 12/18/2021   Hyperlipidemia 08/23/2021   Contraception management 08/23/2021   Stress incontinence in female 08/23/2021   Depression 08/23/2021   Atypical chest pain 08/23/2021   Morbid obesity with BMI of 50.0-59.9, adult (HCC) 08/23/2021   Daytime somnolence 08/23/2021   Past Medical History:  Diagnosis Date   Anxiety    Fainting 2004   triggered by bright lights    Family History  Problem Relation Age of Onset   Hypertension Mother    Diabetes Mother    Hyperlipidemia Mother    Stroke Mother    Other Mother    Heart disease Mother    Asthma Father    Heart disease Maternal Grandmother    Breast cancer Maternal Grandmother    Cervical cancer Maternal Grandmother    Hypertension Maternal Grandmother    Diabetes Maternal Grandmother    Diabetes Maternal Grandfather    Heart disease Maternal Grandfather    Hyperlipidemia Maternal Grandfather     Past Surgical History:  Procedure Laterality Date   FASCIOTOMY Left 08/27/2019   Procedure: left leg anterior/lateral compartmental release;  Surgeon: Cammy Copa, MD;  Location: [facility] OR;  Service: Orthopedics;  Laterality: Left;   Social History   Occupational History   Not on file  Tobacco Use   Smoking status: Every Day    Packs/day: 0.25    Years: 20.00    Additional pack years: 0.00    Total pack years: 5.00    Types: Cigarettes   Smokeless tobacco: Never   Tobacco comments:    5 cig a day  Vaping Use   Vaping Use: Never  used  Substance and Sexual Activity   Alcohol use: No   Drug use: Yes    Frequency: 7.0 times per week    Types: Marijuana   Sexual activity: Not Currently    Birth control/protection: Implant

## 2022-07-27 ENCOUNTER — Telehealth: Payer: Self-pay | Admitting: Physician Assistant

## 2022-07-27 NOTE — Telephone Encounter (Signed)
Patient called in stating her MRI was cancelled because it was not Auth by insurance please advise

## 2022-07-27 NOTE — Telephone Encounter (Signed)
Called pt back informed her insurance denied the MRI because there was not a conservative treatment of 6 weeks so told her we could try to do a reconsideration or p2p, pt states she would like to just come in again in next couple weeks. We can try to resubmit at that time

## 2022-07-28 ENCOUNTER — Other Ambulatory Visit: Payer: Commercial Managed Care - HMO

## 2022-08-14 ENCOUNTER — Encounter: Payer: Self-pay | Admitting: Physician Assistant

## 2022-08-14 ENCOUNTER — Ambulatory Visit (INDEPENDENT_AMBULATORY_CARE_PROVIDER_SITE_OTHER): Payer: Commercial Managed Care - HMO | Admitting: Physician Assistant

## 2022-08-14 DIAGNOSIS — M25562 Pain in left knee: Secondary | ICD-10-CM | POA: Diagnosis not present

## 2022-08-14 NOTE — Addendum Note (Signed)
Addended by: Polly Cobia on: 08/14/2022 09:23 AM   Modules accepted: Orders

## 2022-08-14 NOTE — Progress Notes (Signed)
Office Visit Note   Patient: Kristin Wright           Date of Birth: 05/08/1985           MRN: [id number] Visit Date: 08/14/2022              Requested by: No referring provider defined for this encounter. PCP: Pcp, No  Chief Complaint  Patient presents with   Left Knee - Pain      HPI: Patient is a pleasant 37 year old woman who is a patient of Dr. Diamantina Providence.  She is approximately 6 weeks status post falling onto her knees.  While her right knee recovered she still has difficulties with her left knee especially over the lateral joint line.  History is significant for a release of her anterior compartments of her left leg for exertional compartment syndrome done a few years ago by Dr. August Saucer.  She denies any calf pain or tightness today.  She did have some mechanical symptoms and I did order an MRI.  Unfortunately it was declined because she had not done conservative treatment.  Does not feel she is any better but no worse.  Pain is mostly laterally she does wear knee compression sleeve  Assessment & Plan: Visit Diagnoses: Left knee pain  Plan: Patient will begin physical therapy.  If she does not improve she will contact me I will order an MRI.  She is willing to do this.  I do not have any concerns for compartment syndrome her compartments are soft nontender it is not where she is tender at all she is more tender at the patellofemoral joint and laterally.  Follow-Up Instructions: No follow-ups on file.   Ortho Exam  Patient is alert, oriented, no adenopathy, well-dressed, normal affect, normal respiratory effort. Left knee no effusion no erythema compartments are soft compressible she has a negative Homans' sign.  She does have some tenderness over the lateral joint line minimal over the medial joint line some mild grinding over the patellofemoral joint  Imaging: No results found. No images are attached to the encounter.  Labs: Lab Results  Component Value Date   HGBA1C 5.4  01/23/2022   HGBA1C 5.0 07/03/2017   REPTSTATUS 07/03/2014 FINAL 06/30/2014   CULT  06/30/2014    ESCHERICHIA COLI Performed at [facility]    LABORGA ESCHERICHIA COLI 06/30/2014     Lab Results  Component Value Date   ALBUMIN 4.2 11/15/2021   ALBUMIN 4.0 07/03/2017   ALBUMIN 3.8 06/30/2014    Lab Results  Component Value Date   MG 1.9 11/15/2021   No results found for: "VD25OH"  No results found for: "PREALBUMIN"    Latest Ref Rng & Units 11/15/2021   10:30 AM 09/05/2021    4:31 PM 08/14/2021    7:50 AM  CBC EXTENDED  WBC 4.0 - 10.5 K/uL 7.1  9.9  7.8   RBC 3.87 - 5.11 MIL/uL 3.95  4.08  4.32   Hemoglobin 12.0 - 15.0 g/dL 40.9  81.1  91.4   HCT 36.0 - 46.0 % 39.4  39.7  42.7   Platelets 150 - 400 K/uL 325  418  384   NEUT# 1.7 - 7.7 K/uL 3.5     Lymph# 0.7 - 4.0 K/uL 3.0        There is no height or weight on file to calculate BMI.  Orders:  No orders of the defined types were placed in this encounter.  No orders of the  defined types were placed in this encounter.    Procedures: No procedures performed  Clinical Data: No additional findings.  ROS:  All other systems negative, except as noted in the HPI. Review of Systems  Objective: Vital Signs: There were no vitals taken for this visit.  Specialty Comments:  No specialty comments available.  PMFS History: Patient Active Problem List   Diagnosis Date Noted   Pain in left knee 07/02/2022   Anxiety 02/07/2022   OSA (obstructive sleep apnea) 12/18/2021   Tobacco use 12/18/2021   Hyperlipidemia 08/23/2021   Contraception management 08/23/2021   Stress incontinence in female 08/23/2021   Depression 08/23/2021   Atypical chest pain 08/23/2021   Morbid obesity with BMI of 50.0-59.9, adult (HCC) 08/23/2021   Daytime somnolence 08/23/2021   Past Medical History:  Diagnosis Date   Anxiety    Fainting 2004   triggered by bright lights    Family History  Problem Relation Age of Onset    Hypertension Mother    Diabetes Mother    Hyperlipidemia Mother    Stroke Mother    Other Mother    Heart disease Mother    Asthma Father    Heart disease Maternal Grandmother    Breast cancer Maternal Grandmother    Cervical cancer Maternal Grandmother    Hypertension Maternal Grandmother    Diabetes Maternal Grandmother    Diabetes Maternal Grandfather    Heart disease Maternal Grandfather    Hyperlipidemia Maternal Grandfather     Past Surgical History:  Procedure Laterality Date   FASCIOTOMY Left 08/27/2019   Procedure: left leg anterior/lateral compartmental release;  Surgeon: Cammy Copa, MD;  Location: [facility] OR;  Service: Orthopedics;  Laterality: Left;   Social History   Occupational History   Not on file  Tobacco Use   Smoking status: Every Day    Packs/day: 0.25    Years: 20.00    Additional pack years: 0.00    Total pack years: 5.00    Types: Cigarettes   Smokeless tobacco: Never   Tobacco comments:    5 cig a day  Vaping Use   Vaping Use: Never used  Substance and Sexual Activity   Alcohol use: No   Drug use: Yes    Frequency: 7.0 times per week    Types: Marijuana   Sexual activity: Not Currently    Birth control/protection: Implant

## 2022-08-30 ENCOUNTER — Ambulatory Visit: Payer: Commercial Managed Care - HMO

## 2022-12-27 ENCOUNTER — Emergency Department (HOSPITAL_BASED_OUTPATIENT_CLINIC_OR_DEPARTMENT_OTHER): Payer: Commercial Managed Care - HMO

## 2022-12-27 ENCOUNTER — Emergency Department (HOSPITAL_BASED_OUTPATIENT_CLINIC_OR_DEPARTMENT_OTHER)
Admission: EM | Admit: 2022-12-27 | Discharge: 2022-12-28 | Disposition: A | Discharge status: Home / Self Care | Payer: Commercial Managed Care - HMO | Attending: Emergency Medicine | Admitting: Emergency Medicine

## 2022-12-27 ENCOUNTER — Encounter (HOSPITAL_BASED_OUTPATIENT_CLINIC_OR_DEPARTMENT_OTHER): Payer: Self-pay | Admitting: *Deleted

## 2022-12-27 ENCOUNTER — Emergency Department (HOSPITAL_COMMUNITY): Payer: Commercial Managed Care - HMO

## 2022-12-27 ENCOUNTER — Other Ambulatory Visit: Payer: Self-pay

## 2022-12-27 DIAGNOSIS — D75839 Thrombocytosis, unspecified: Secondary | ICD-10-CM | POA: Diagnosis not present

## 2022-12-27 DIAGNOSIS — H547 Unspecified visual loss: Secondary | ICD-10-CM | POA: Diagnosis not present

## 2022-12-27 DIAGNOSIS — R519 Headache, unspecified: Secondary | ICD-10-CM | POA: Diagnosis not present

## 2022-12-27 DIAGNOSIS — H539 Unspecified visual disturbance: Secondary | ICD-10-CM

## 2022-12-27 LAB — CBC
HCT: 41.2 % (ref 36.0–46.0)
Hemoglobin: 13.6 g/dL (ref 12.0–15.0)
MCH: 32.7 pg (ref 26.0–34.0)
MCHC: 33 g/dL (ref 30.0–36.0)
MCV: 99 fL (ref 80.0–100.0)
Platelets: 416 10*3/uL — ABNORMAL HIGH (ref 150–400)
RBC: 4.16 MIL/uL (ref 3.87–5.11)
RDW: 13 % (ref 11.5–15.5)
WBC: 8.7 10*3/uL (ref 4.0–10.5)
nRBC: 0 % (ref 0.0–0.2)

## 2022-12-27 LAB — BASIC METABOLIC PANEL
Anion gap: 8 (ref 5–15)
BUN: 8 mg/dL (ref 6–20)
CO2: 28 mmol/L (ref 22–32)
Calcium: 9.8 mg/dL (ref 8.9–10.3)
Chloride: 104 mmol/L (ref 98–111)
Creatinine, Ser: 0.88 mg/dL (ref 0.44–1.00)
GFR, Estimated: >60 mL/min (ref >60)
Glucose, Bld: 81 mg/dL (ref 70–99)
Potassium: 3.8 mmol/L (ref 3.5–5.1)
Sodium: 140 mmol/L (ref 135–145)

## 2022-12-27 LAB — SEDIMENTATION RATE: Sed Rate: 52 mm/h — ABNORMAL HIGH (ref 0–22)

## 2022-12-27 LAB — C-REACTIVE PROTEIN: CRP: 2.9 mg/dL — ABNORMAL HIGH (ref <1.0)

## 2022-12-27 LAB — PREGNANCY, URINE: Preg Test, Ur: NEGATIVE

## 2022-12-27 MED ORDER — IOHEXOL 300 MG/ML  SOLN
100.0000 mL | Freq: Once | INTRAMUSCULAR | Status: AC | PRN
  Administered 2022-12-27: 75 mL via INTRAVENOUS

## 2022-12-27 MED ORDER — GADOBUTROL 1 MMOL/ML IV SOLN
10.0000 mL | Freq: Once | INTRAVENOUS | Status: AC | PRN
Start: 2022-12-27 — End: 2022-12-27
  Administered 2022-12-27: 10 mL via INTRAVENOUS

## 2022-12-27 MED ORDER — DIPHENHYDRAMINE HCL 50 MG/ML IJ SOLN
25.0000 mg | Freq: Once | INTRAMUSCULAR | Status: AC
  Administered 2022-12-27: 25 mg via INTRAVENOUS
  Filled 2022-12-27: qty 1

## 2022-12-27 MED ORDER — TETRACAINE HCL 0.5 % OP SOLN
2.0000 [drp] | Freq: Once | OPHTHALMIC | Status: AC
  Administered 2022-12-27: 2 [drp] via OPHTHALMIC
  Filled 2022-12-27: qty 4

## 2022-12-27 MED ORDER — KETOROLAC TROMETHAMINE 15 MG/ML IJ SOLN
15.0000 mg | Freq: Once | INTRAMUSCULAR | Status: AC
  Administered 2022-12-27: 15 mg via INTRAVENOUS
  Filled 2022-12-27: qty 1

## 2022-12-27 MED ORDER — METOCLOPRAMIDE HCL 5 MG/ML IJ SOLN
10.0000 mg | Freq: Once | INTRAMUSCULAR | Status: AC
  Administered 2022-12-27: 10 mg via INTRAVENOUS
  Filled 2022-12-27: qty 2

## 2022-12-27 NOTE — ED Notes (Signed)
Report given to [place] RN CHG @ [facility] Awaiting [facility] to transport

## 2022-12-27 NOTE — ED Notes (Signed)
Report given to [facility]. 

## 2022-12-27 NOTE — ED Notes (Signed)
Pt resting has no c/o pain at this time

## 2022-12-27 NOTE — ED Triage Notes (Signed)
Pt to ED reporting headache x 2 weeks with partial vision loss in left eye for 48 hours. No other neuro deficits noted. Pt has tried muscle relaxers, tylenol and hydrocodone without relief of headache. Hx of seizures.   No known hx of HTN reported.

## 2022-12-27 NOTE — ED Provider Notes (Signed)
[place] EMERGENCY DEPARTMENT AT [facility] Provider Note   CSN: [id number] Arrival date & time: 12/27/22  1334     History  Chief Complaint  Patient presents with   Headache   Loss of Vision    Kristin Wright is a 37 y.o. female.   Headache   37 year old female presents to the emergency department with complaints of left-sided headache as well as loss of vision in her left eye.  Patient states over the past couple of weeks has had constant headache.  Describes headache is somewhat migratory located behind her left eye, temple region, back of her left head as well as the right sided temple and behind her right eye.  States that she is currently feeling headache in her left temple region as well as behind her left eye.  States that she usually gets headaches and involves seeing spots in her left eye as well as intermittent curtainlike vision loss/changes in her left eye that has been going on for years per patient.  States that over the past 2 days, has noticed near complete vision loss in her left eye.  States that she is able to see some white spots in her left eye but cannot read any object as her vision is mostly gone.  Has been trying at home muscle laxer, Tylenol, hydrocodone without significant relief.  States that she has not had this significant of visual symptoms associated with headache before.  Denies any fever, gait abnormality, slurred speech, facial droop, weakness/sensory deficits in upper or lower extremities, neck pain.  Past medical history significant for OSA, hyperlipidemia, anxiety, morbid obesity, other  Home Medications Prior to Admission medications   Medication Sig Start Date End Date Taking? Authorizing Provider  citalopram (CELEXA) 20 MG tablet Take 1 tablet (20 mg total) by mouth daily. 01/23/22 01/23/23  Morene Crocker, MD  propranolol (INDERAL) 10 MG tablet Take 1 tablet (10 mg total) by mouth 3 (three) times daily as needed. 02/06/22  03/08/22  Marolyn Haller, MD      Allergies    Aspirin, Milk-related compounds, Penicillins, Soy allergy, and Yeast-derived drug products    Review of Systems   Review of Systems  Neurological:  Positive for headaches.  All other systems reviewed and are negative.   Physical Exam Updated Vital Signs BP 113/62   Pulse 79   Temp 98.2 F (36.8 C) (Oral)   Resp 18   Ht 5\' 7"  (1.702 m)   Wt (!) 154.2 kg   LMP 12/18/2022 (Approximate)   SpO2 100%   BMI 53.25 kg/m  Physical Exam Vitals and nursing note reviewed.  Constitutional:      General: She is not in acute distress.    Appearance: She is well-developed.  HENT:     Head: Normocephalic and atraumatic.  Eyes:     Intraocular pressure: Left eye pressure is 19 mmHg. Measurements were taken using an automated tonometer.    Conjunctiva/sclera: Conjunctivae normal.     Right eye: Right conjunctiva is not injected. No chemosis, exudate or hemorrhage.    Left eye: Left conjunctiva is not injected. No chemosis, exudate or hemorrhage. Cardiovascular:     Rate and Rhythm: Normal rate and regular rhythm.     Heart sounds: No murmur heard. Pulmonary:     Effort: Pulmonary effort is normal. No respiratory distress.     Breath sounds: Normal breath sounds.  Abdominal:     Palpations: Abdomen is soft.     Tenderness: There is  no abdominal tenderness.  Musculoskeletal:        General: No swelling.     Cervical back: Neck supple.  Skin:    General: Skin is warm and dry.     Capillary Refill: Capillary refill takes less than 2 seconds.  Neurological:     Mental Status: She is alert.     Comments: Alert and oriented to self, place, time and event.   Speech is fluent, clear without dysarthria or dysphasia.   Strength 5/5 in upper/lower extremities   Sensation intact in upper/lower extremities   Normal gait.  Negative Romberg. No pronator drift.  Normal finger-to-nose and feet tapping.  CN I not tested  CN II patient with  normal visual field confrontation performed throughout 20 inches with the right eye.  Unable to see anything out of the left eye even when attempting to count fingers in all 4 quadrants with left eye around 6 inches away from patient's face.  Did not visualize posterior eye.  CN III, IV, VI PERRLA and EOMs intact bilaterally  CN V decree sensation left forehead.  Otherwise, intact sensation to sharp and light touch to the face  CN VII facial movements symmetric  CN VIII not tested  CN IX, X no uvula deviation, symmetric rise of soft palate  CN XI 5/5 SCM and trapezius strength bilaterally  CN XII Midline tongue protrusion, symmetric L/R movements     Psychiatric:        Mood and Affect: Mood normal.     ED Results / Procedures / Treatments   Labs (all labs ordered are listed, but only abnormal results are displayed) Labs Reviewed  CBC - Abnormal; Notable for the following components:      Result Value   Platelets 416 (*)    All other components within normal limits  SEDIMENTATION RATE - Abnormal; Notable for the following components:   Sed Rate 52 (*)    All other components within normal limits  BASIC METABOLIC PANEL  PREGNANCY, URINE  C-REACTIVE PROTEIN    EKG None  Radiology CT Head Wo Contrast  Result Date: 12/27/2022 CLINICAL DATA:  Provided history: Headache, increasing frequency or severity. Partial vision loss in left eye for 48 hours. Seizure history. EXAM: CT HEAD AND ORBITS WITHOUT CONTRAST TECHNIQUE: Contiguous axial images were obtained from the base of the skull through the vertex without contrast. Multidetector CT imaging of the orbits was performed using the standard protocol without intravenous contrast. RADIATION DOSE REDUCTION: This exam was performed according to the departmental dose-optimization program which includes automated exposure control, adjustment of the mA and/or kV according to patient size and/or use of iterative reconstruction technique.  COMPARISON:  Head CT 09/05/2021. FINDINGS: CT HEAD FINDINGS Brain: No age advanced or lobar predominant parenchymal atrophy. There is no acute intracranial hemorrhage. No demarcated cortical infarct. No extra-axial fluid collection. No evidence of an intracranial mass. No midline shift. Vascular: No hyperdense vessel. Skull: No calvarial fracture or aggressive osseous lesion. CT ORBITS FINDINGS Orbits: The globes are normal in size and contour. The extraocular muscles, optic nerve sheath complexes and lacrimal glands are symmetric and unremarkable on this non-contrast examination. Visualized sinuses: Trace mucosal thickening within the bilateral maxillary sinuses. Soft tissues: No mass or inflammatory changes identified within the imaged facial/upper neck soft tissues. Other: Bilateral concha bullosa. IMPRESSION: CT head: No evidence of an acute intracranial abnormality. CT orbits: Unremarkable non-contrast CT appearance of the orbits. Electronically Signed   By: Jackey Loge D.O.  On: 12/27/2022 17:46   CT Orbits W Contrast  Result Date: 12/27/2022 CLINICAL DATA:  Provided history: Headache, increasing frequency or severity. Partial vision loss in left eye for 48 hours. Seizure history. EXAM: CT HEAD AND ORBITS WITHOUT CONTRAST TECHNIQUE: Contiguous axial images were obtained from the base of the skull through the vertex without contrast. Multidetector CT imaging of the orbits was performed using the standard protocol without intravenous contrast. RADIATION DOSE REDUCTION: This exam was performed according to the departmental dose-optimization program which includes automated exposure control, adjustment of the mA and/or kV according to patient size and/or use of iterative reconstruction technique. COMPARISON:  Head CT 09/05/2021. FINDINGS: CT HEAD FINDINGS Brain: No age advanced or lobar predominant parenchymal atrophy. There is no acute intracranial hemorrhage. No demarcated cortical infarct. No extra-axial  fluid collection. No evidence of an intracranial mass. No midline shift. Vascular: No hyperdense vessel. Skull: No calvarial fracture or aggressive osseous lesion. CT ORBITS FINDINGS Orbits: The globes are normal in size and contour. The extraocular muscles, optic nerve sheath complexes and lacrimal glands are symmetric and unremarkable on this non-contrast examination. Visualized sinuses: Trace mucosal thickening within the bilateral maxillary sinuses. Soft tissues: No mass or inflammatory changes identified within the imaged facial/upper neck soft tissues. Other: Bilateral concha bullosa. IMPRESSION: CT head: No evidence of an acute intracranial abnormality. CT orbits: Unremarkable non-contrast CT appearance of the orbits. Electronically Signed   By: Jackey Loge D.O.   On: 12/27/2022 17:46    Procedures Procedures    Medications Ordered in ED Medications  ketorolac (TORADOL) 15 MG/ML injection 15 mg (15 mg Intravenous Given 12/27/22 1456)  metoCLOPramide (REGLAN) injection 10 mg (10 mg Intravenous Given 12/27/22 1456)  diphenhydrAMINE (BENADRYL) injection 25 mg (25 mg Intravenous Given 12/27/22 1456)  iohexol (OMNIPAQUE) 300 MG/ML solution 100 mL (75 mLs Intravenous Contrast Given 12/27/22 1600)  tetracaine (PONTOCAINE) 0.5 % ophthalmic solution 2 drop (2 drops Left Eye Given 12/27/22 1855)    ED Course/ Medical Decision Making/ A&P Clinical Course as of 12/27/22 2032  Thu Dec 27, 2022  1754 Reevaluation of the patient showed improvement of right-sided visual fields of left eye.  Patient able to make out "shadows".  Able to tell how many fingers are being held up at roughly 6 inches from face and upper and lower lateral visual fields of the left eye.  Patient still unable to see anything out of the medial visual field of her left eye [CR]  1802 Consulted neurology Dr. Amada Jupiter who recommended MRI imaging with and without contrast of brain and orbits.  Did not think it was likely secondary to  round cell arteritis even with elevation in sedimentation rate. [CR]    Clinical Course User Index [CR] Peter Garter, PA                                 Medical Decision Making Amount and/or Complexity of Data Reviewed Labs: ordered. Radiology: ordered.  Risk Prescription drug management.   This patient presents to the ED for concern of headache, vision loss, this involves an extensive number of treatment options, and is a complaint that carries with it a high risk of complications and morbidity.  The differential diagnosis includes pseudotumor cerebri, MS, retinal detachment, giant cell arteritis, tumor/malignancy, migraine/tension/cluster headache, carotid artery/vertebral artery dissection, meningitis/encephalitis, other   Co morbidities that complicate the patient evaluation  See HPI   Additional history obtained:  Additional history obtained from EMR External records from outside source obtained and reviewed including hospital records   Lab Tests:  I Ordered, and personally interpreted labs.  The pertinent results include: No leukocytosis.  No evidence of anemia.  Thrombocytosis of 416.  No electrolyte abnormalities.  No renal dysfunction.  Urine pregnancy negative.  Sed rate elevated at 52.  CRP pending   Imaging Studies ordered:  I ordered imaging studies including CT head/orbits I independently visualized and interpreted imaging which showed no acute abnormality I agree with the radiologist interpretation   Cardiac Monitoring: / EKG:  The patient was maintained on a cardiac monitor.  I personally viewed and interpreted the cardiac monitored which showed an underlying rhythm of: Sinus rhythm   Consultations Obtained:  See ED course  Problem List / ED Course / Critical interventions / Medication management  Headache I ordered medication including Toradol, Reglan, Benadryl, tetracaine  Reevaluation of the patient after these medicines showed that the  patient improved I have reviewed the patients home medicines and have made adjustments as needed   Social Determinants of Health:  Chronic cigarette use.  Denies illicit drug use.   Test / Admission - Considered:  Headache, visual changes Vitals signs within normal range and stable throughout visit. Laboratory/imaging studies significant for: See above 37 year old female presents to the emergency department with complaints of headache.  Headache present for the past 2 weeks with loss of vision in her left eye for the past 2 days.  On exam, patient unable to see out of any quadrant of her left eye when testing visual fields approximately 4 to 6 inches away from patient's face.  Patient's pain seems to morning more left-sided retro-orbital although with some left temporal tenderness on exam.  Otherwise, benign neurologic exam.  Labs and CTs reassuring.  Reevaluation after migraine cocktail did show improvement of left-sided visual symptoms as described in ED course.  Consulted neurology regarding the patient who recommended transfer for MRI brain and orbits with and without contrast for assessment of potential optic neuritis.  Treatment plan discussed at length with patient and she acknowledged understanding was agreeable to said plan.  Will transfer to [facility] for more advanced imaging.         Final Clinical Impression(s) / ED Diagnoses Final diagnoses:  None    Rx / DC Orders ED Discharge Orders     None         Peter Garter, [place] 12/27/22 2032    Rozelle Logan, DO 12/28/22 0024

## 2022-12-28 ENCOUNTER — Encounter (INDEPENDENT_AMBULATORY_CARE_PROVIDER_SITE_OTHER): Payer: Self-pay | Admitting: Ophthalmology

## 2022-12-28 ENCOUNTER — Ambulatory Visit (INDEPENDENT_AMBULATORY_CARE_PROVIDER_SITE_OTHER): Payer: Managed Care, Other (non HMO) | Admitting: Ophthalmology

## 2022-12-28 VITALS — BP 122/83 | HR 85

## 2022-12-28 DIAGNOSIS — H35033 Hypertensive retinopathy, bilateral: Secondary | ICD-10-CM

## 2022-12-28 DIAGNOSIS — H539 Unspecified visual disturbance: Secondary | ICD-10-CM

## 2022-12-28 DIAGNOSIS — G43101 Migraine with aura, not intractable, with status migrainosus: Secondary | ICD-10-CM

## 2022-12-28 DIAGNOSIS — H3581 Retinal edema: Secondary | ICD-10-CM

## 2022-12-28 DIAGNOSIS — I10 Essential (primary) hypertension: Secondary | ICD-10-CM

## 2022-12-28 DIAGNOSIS — G43509 Persistent migraine aura without cerebral infarction, not intractable, without status migrainosus: Secondary | ICD-10-CM

## 2022-12-28 NOTE — Progress Notes (Signed)
[facility] - Clinic Note  12/28/2022   CHIEF COMPLAINT Patient presents for Retina Evaluation  HISTORY OF PRESENT ILLNESS: Kristin Wright is a 37 y.o. female who presents to the clinic today for:  HPI     Retina Evaluation   In both eyes.  I, the attending physician,  performed the HPI with the patient and updated documentation appropriately.        Comments   Patient is here today after being in the ED. She has a history of migraines with ora. She is light sensitive and gets a blind spot. This last migraine she has had for two weeks behind the left eye. As per the ED all test results were normal. She is not using eye drops.       Last edited by Rennis Chris, MD on 12/30/2022 10:39 PM.     Pt is here on ED referral for concern of migraines with vision loss OS, pt has a hx of migraines with aura, she states the headache lasts about 4 days max and the aura last about 24 hours, pt states this time after 48 hours no medication helped her headache, she states she is treated for seizures, but not for headaches by neurology, pt states she is concerned about her vision bc she drives a lot, she states she got a "migraine cocktail" in the hospital which helped her headache, but didn't help the vision loss, pt states if she covers her right eye, she can see "shapes", pt states she gets migraines about 4 times a month, sometimes they will cluster together, she states she has prescription pain medication she will take for her headaches, but it doesn't completely make the pain go away, she states extra strength Excedrin migraine does not help either, pt denies being hypertensive or diabetic  Referring physician: No referring provider defined for this encounter.  HISTORICAL INFORMATION:  Selected notes from the MEDICAL RECORD NUMBER ED f/u her migraines with aura LEE:  Ocular Hx- PMH-   CURRENT MEDICATIONS: No current outpatient medications on file. (Ophthalmic Drugs)    No current facility-administered medications for this visit. (Ophthalmic Drugs)   Current Outpatient Medications (Other)  Medication Sig   citalopram (CELEXA) 20 MG tablet Take 1 tablet (20 mg total) by mouth daily.   propranolol (INDERAL) 10 MG tablet Take 1 tablet (10 mg total) by mouth 3 (three) times daily as needed.   No current facility-administered medications for this visit. (Other)   REVIEW OF SYSTEMS: ROS   Positive for: Neurological, Eyes, Psychiatric Last edited by Charlette Caffey, COT on 12/28/2022  8:27 AM.     ALLERGIES Allergies  Allergen Reactions   Aspirin Anaphylaxis   Milk-Related Compounds    Penicillins Other (See Comments)    Childhood allergy, unknown reaction    Soy Allergy    Yeast-Derived Drug Products    PAST MEDICAL HISTORY Past Medical History:  Diagnosis Date   Anxiety    Fainting 2004   triggered by bright lights   Past Surgical History:  Procedure Laterality Date   FASCIOTOMY Left 08/27/2019   Procedure: left leg anterior/lateral compartmental release;  Surgeon: Cammy Copa, MD;  Location: [facility] OR;  Service: Orthopedics;  Laterality: Left;   FAMILY HISTORY Family History  Problem Relation Age of Onset   Hypertension Mother    Diabetes Mother    Hyperlipidemia Mother    Stroke Mother    Other Mother    Heart disease Mother  Asthma Father    Heart disease Maternal Grandmother    Breast cancer Maternal Grandmother    Cervical cancer Maternal Grandmother    Hypertension Maternal Grandmother    Diabetes Maternal Grandmother    Diabetes Maternal Grandfather    Heart disease Maternal Grandfather    Hyperlipidemia Maternal Grandfather    SOCIAL HISTORY Social History   Tobacco Use   Smoking status: Every Day    Current packs/day: 0.25    Average packs/day: 0.3 packs/day for 20.0 years (5.0 ttl pk-yrs)    Types: Cigarettes   Smokeless tobacco: Never   Tobacco comments:    5 cig a day  Vaping Use   Vaping status:  Never Used  Substance Use Topics   Alcohol use: No   Drug use: Yes    Frequency: 7.0 times per week    Types: Marijuana       OPHTHALMIC EXAM:  Base Eye Exam     Visual Acuity (Snellen - Linear)       Right Left   Dist [place] 20/20 +2 20/70 +1   Dist ph [place]  NI         Tonometry (Tonopen, 8:32 AM)       Right Left   Pressure 18 18         Pupils       Dark Light Shape React APD   Right 3 2 Round Brisk None   Left 3 2 Round Slow None         Visual Fields       Left Right     Full   Restrictions Partial outer superior temporal, inferior temporal, superior nasal, inferior nasal deficiencies          Extraocular Movement       Right Left    Full, Ortho Full, Ortho         Neuro/Psych     Oriented x3: Yes         Dilation     Both eyes: 1.0% Mydriacyl, 2.5% Phenylephrine @ 8:30 AM           Slit Lamp and Fundus Exam     Slit Lamp Exam       Right Left   Lids/Lashes Dermatochalasis - upper lid Dermatochalasis - upper lid   Conjunctiva/Sclera White and quiet White and quiet   Cornea Clear Clear   Anterior Chamber deep and clear deep and clear   Iris Round and dilated Round and dilated   Lens Clear Clear         Fundus Exam       Right Left   Disc Pink and Sharp Pink and Sharp, no heme or edema   C/D Ratio 0.4 0.4   Macula Flat, Good foveal reflex, No heme or edema Flat, Good foveal reflex, No heme or edema   Vessels mild tortuosity mild attenuation, Tortuous   Periphery Attached Attached           IMAGING AND PROCEDURES  Imaging and Procedures for 12/28/2022  OCT, Retina - OU - Both Eyes       Right Eye Quality was good. Central Foveal Thickness: 223. Progression has no prior data. Findings include normal foveal contour, no IRF, no SRF, vitreomacular adhesion .   Left Eye Quality was good. Central Foveal Thickness: 223. Progression has no prior data. Findings include normal foveal contour, no IRF, no SRF, vitreomacular  adhesion (Mild nasal elevation of disc).   Notes *Images captured and  stored on drive  Diagnosis / Impression:  NFP, no IRF/SRF OU OS: Mild nasal elevation of disc  Clinical management:  See below  Abbreviations: NFP - Normal foveal profile. CME - cystoid macular edema. PED - pigment epithelial detachment. IRF - intraretinal fluid. SRF - subretinal fluid. EZ - ellipsoid zone. ERM - epiretinal membrane. ORA - outer retinal atrophy. ORT - outer retinal tubulation. SRHM - subretinal hyper-reflective material. IRHM - intraretinal hyper-reflective material      Fluorescein Angiography Optos (Transit OS)       Right Eye Progression has no prior data. Early phase findings include normal observations. Mid/Late phase findings include normal observations (No leakage, no hyper or hypofluoresence).   Left Eye Progression has no prior data. Early phase findings include normal observations. Mid/Late phase findings include normal observations (No leakage, no hyper or hypofluoresence).   Notes **Images stored on drive**  Impression: Normal study OU - No leakage, no hyper or hypofluoresence OU          ASSESSMENT/PLAN:   ICD-10-CM   1. Persistent migraine aura without cerebral infarction and without status migrainosus, not intractable  G43.509 Fluorescein Angiography Optos (Transit OS)    2. Visual disturbance  H53.9 Fluorescein Angiography Optos (Transit OS)    3. Retinal edema  H35.81 OCT, Retina - OU - Both Eyes    4. Hypertensive retinopathy of both eyes  H35.033      1,2. Migraine w/ visual disturbance OS  - pt reports history of recurrent migraine with transient vision loss that typically lasts several hours  - pt presented to ED on 11.07.24 due to persistent vision loss OS lasting 2 days  - MRI/MRA done in ED showed no acute abnormalities  - pt was given a migraine cocktail and symptoms have improved   - here BCVA 20/70 OS and subjectively improving  - dilated exam, OCT and  FA all completely normal  - no disc edema or any other pathologies to explain symptoms  - no retinal or ophthalmic interventions indicated or recommended   - pt follows with Neurology for history of seizures -- recommend further evaluation with Neurology for migraine  - f/u here prn   3. No retinal edema on exam or OCT   Ophthalmic Meds Ordered this visit:  No orders of the defined types were placed in this encounter.    Return if symptoms worsen or fail to improve.  There are no Patient Instructions on file for this visit.  Explained the diagnoses, plan, and follow up with the patient and they expressed understanding.  Patient expressed understanding of the importance of proper follow up care.   This document serves as a record of services personally performed by Karie Chimera, MD, PhD. It was created on their behalf by Glee Arvin. Manson Passey, OA an ophthalmic technician. The creation of this record is the provider's dictation and/or activities during the visit.    Electronically signed by: Glee Arvin. Manson Passey, OA 12/30/22 10:49 PM   Karie Chimera, M.D., Ph.D. Diseases & Surgery of the Retina and Vitreous [facility] 12/28/2022  I have reviewed the above documentation for accuracy and completeness, and I agree with the above. Karie Chimera, M.D., Ph.D. 12/30/22 11:04 PM   Abbreviations: M myopia (nearsighted); A astigmatism; H hyperopia (farsighted); P presbyopia; Mrx spectacle prescription;  CTL contact lenses; OD right eye; OS left eye; OU both eyes  XT exotropia; ET esotropia; PEK punctate epithelial keratitis; PEE punctate epithelial erosions; DES  dry eye syndrome; MGD meibomian gland dysfunction; ATs artificial tears; PFAT's preservative free artificial tears; NSC nuclear sclerotic cataract; PSC posterior subcapsular cataract; ERM epi-retinal membrane; PVD posterior vitreous detachment; RD retinal detachment; DM diabetes mellitus; DR diabetic retinopathy; NPDR  non-proliferative diabetic retinopathy; PDR proliferative diabetic retinopathy; CSME clinically significant macular edema; DME diabetic macular edema; dbh dot blot hemorrhages; CWS cotton wool spot; POAG primary open angle glaucoma; C/D cup-to-disc ratio; HVF humphrey visual field; GVF goldmann visual field; OCT optical coherence tomography; IOP intraocular pressure; BRVO Branch retinal vein occlusion; CRVO central retinal vein occlusion; CRAO central retinal artery occlusion; BRAO branch retinal artery occlusion; RT retinal tear; SB scleral buckle; PPV pars plana vitrectomy; VH Vitreous hemorrhage; PRP panretinal laser photocoagulation; IVK intravitreal kenalog; VMT vitreomacular traction; MH Macular hole;  NVD neovascularization of the disc; NVE neovascularization elsewhere; AREDS age related eye disease study; ARMD age related macular degeneration; POAG primary open angle glaucoma; EBMD epithelial/anterior basement membrane dystrophy; ACIOL anterior chamber intraocular lens; IOL intraocular lens; PCIOL posterior chamber intraocular lens; Phaco/IOL phacoemulsification with intraocular lens placement; PRK photorefractive keratectomy; LASIK laser assisted in situ keratomileusis; HTN hypertension; DM diabetes mellitus; COPD chronic obstructive pulmonary disease

## 2022-12-28 NOTE — ED Provider Notes (Signed)
  Provider Note MRN:  [id number]  Arrival date & time: 12/28/22    ED Course and Medical Decision Making  I assumed care of the patient upon transfer from neighboring facility.  Monocular vision loss with headache, has had similar headaches complex migraines in the past but the visual disturbance is lingering.  Sent here for MRI.  2 AM update: The MRI images are normal.  Case we discussed with Dr. Derry Lory of neurology and also spoke with Dr. Vanessa Barbara of ophthalmology.  Given that her visual disturbance is improving and she is feeling better, she is appropriate for discharge, we have established a follow-up for her tomorrow morning with Dr. Vanessa Barbara in the eye clinic.  Procedures  Final Clinical Impressions(s) / ED Diagnoses     ICD-10-CM   1. Visual disturbance  H53.9       ED Discharge Orders     None         Discharge Instructions      You were evaluated in the Emergency Department and after careful evaluation, we did not find any emergent condition requiring admission or further testing in the hospital.  Your exam/testing today is overall reassuring.  MRI of your brain and eyes were normal.  We recommend follow-up with Dr. Vanessa Barbara at his office tomorrow morning at 8:30 AM for a closer examination of your eye.  Please return to the Emergency Department if you experience any worsening of your condition.   Thank you for allowing [place] to be a part of your care.    Elmer Sow. Pilar Plate, MD [facility] Emergency Medicine [facility] [email or web address]    Sabas Sous, MD 12/28/22 [phone]

## 2022-12-28 NOTE — Discharge Instructions (Signed)
You were evaluated in the Emergency Department and after careful evaluation, we did not find any emergent condition requiring admission or further testing in the hospital.  Your exam/testing today is overall reassuring.  MRI of your brain and eyes were normal.  We recommend follow-up with Dr. Vanessa Barbara at his office tomorrow morning at 8:30 AM for a closer examination of your eye.  Please return to the Emergency Department if you experience any worsening of your condition.   Thank you for allowing [place] to be a part of your care.

## 2022-12-30 ENCOUNTER — Encounter (INDEPENDENT_AMBULATORY_CARE_PROVIDER_SITE_OTHER): Payer: Self-pay | Admitting: Ophthalmology

## 2023-01-16 ENCOUNTER — Encounter: Payer: Commercial Managed Care - HMO | Admitting: Student

## 2023-02-01 ENCOUNTER — Encounter: Payer: Self-pay | Admitting: Student

## 2023-02-01 ENCOUNTER — Ambulatory Visit (INDEPENDENT_AMBULATORY_CARE_PROVIDER_SITE_OTHER): Payer: Commercial Managed Care - HMO | Admitting: Student

## 2023-02-01 VITALS — BP 134/51 | HR 88 | Temp 97.9°F | Ht 67.0 in | Wt 329.7 lb

## 2023-02-01 DIAGNOSIS — R112 Nausea with vomiting, unspecified: Secondary | ICD-10-CM

## 2023-02-01 DIAGNOSIS — G43E09 Chronic migraine with aura, not intractable, without status migrainosus: Secondary | ICD-10-CM

## 2023-02-01 DIAGNOSIS — Z23 Encounter for immunization: Secondary | ICD-10-CM | POA: Diagnosis not present

## 2023-02-01 DIAGNOSIS — F12988 Cannabis use, unspecified with other cannabis-induced disorder: Secondary | ICD-10-CM

## 2023-02-01 DIAGNOSIS — G4733 Obstructive sleep apnea (adult) (pediatric): Secondary | ICD-10-CM

## 2023-02-01 DIAGNOSIS — G43909 Migraine, unspecified, not intractable, without status migrainosus: Secondary | ICD-10-CM | POA: Diagnosis not present

## 2023-02-01 DIAGNOSIS — R1110 Vomiting, unspecified: Secondary | ICD-10-CM

## 2023-02-01 DIAGNOSIS — Z0000 Encounter for general adult medical examination without abnormal findings: Secondary | ICD-10-CM | POA: Insufficient documentation

## 2023-02-01 DIAGNOSIS — F1290 Cannabis use, unspecified, uncomplicated: Secondary | ICD-10-CM | POA: Insufficient documentation

## 2023-02-01 HISTORY — DX: Nausea with vomiting, unspecified: R11.2

## 2023-02-01 MED ORDER — NURTEC 75 MG PO TBDP: 1.0000 | ORAL_TABLET | ORAL | 2 refills | Status: DC

## 2023-02-01 NOTE — Patient Instructions (Signed)
Thank you, Ms.Annamary Cindee Lame for allowing [place] to provide your care today. Today we discussed your migraines, marijuana use, and sleep apnea.  For your migraines, I have prescribed you a medication called Nurtec.  You may take this medication once every 24 hours to relieve your symptoms.  Once your headache is gone, you may take it once every other day to prevent future headaches.  We will follow-up on this at your next visit in a few weeks to see if it is helping you.  As we discussed, I suspect your vomiting is related to your daily marijuana use.  Please work on cutting this down slowly and we will see if your vomiting resolves.  If you have any new mood symptoms or anxiety, please give [place] a call or let us know at the next visit so we can help you manage those.  If you experience any new symptoms including lightheadedness/dizziness, new changes in vision, chest pain, or passing out, please either go to the emergency department or give [place] a call.  I have ordered the following medication/changed the following medications:   Start the following medications: Meds ordered this encounter  Medications   Rimegepant Sulfate (NURTEC) 75 MG TBDP    Sig: Take 1 tablet (75 mg total) by mouth every other day.    Dispense:  15 tablet    Refill:  2     Follow up: 3-4 weeks  We look forward to seeing you next time. Please call our clinic at [phone] if you have any questions or concerns. The best time to call is Monday-Friday from 9am-4pm, but there is someone available 24/7. If after hours or the weekend, call the main hospital number and ask for the Internal Medicine Resident On-Call. If you need medication refills, please notify your pharmacy one week in advance and they will send [place] a request.   Thank you for trusting me with your care. Wishing you the best!   Annett Fabian, MD [facility]

## 2023-02-01 NOTE — Assessment & Plan Note (Addendum)
She has a long history of chronic migraines with vision changes. She was recently seen in the ED for this and was given a migraine cocktail, which improved her symptoms. An MRI at that time was unremarkable. She recently saw an ophthalmologist and her eye exam was normal. She reports that she has continued to have constant daily migraines for the past few weeks since her ED evaluation. Prior to that visit, she says her migraines were daily as well. She has been taking Excedrin with no relief. She has not taken anything else for her symptoms, but has been making environmental changes and mostly lives in the dark. She reports intermittent left-sided vision loss, which is a common aura with her migraines. She feels her current migraines are similar in quality to her normal ones and denies any new symptoms. She has not tried any prescription migraine medications in the past. She previously tried propranolol for performance anxiety, but is not currently taking it. This is an option for prophylaxis in the future if Nurtec does not help.   Plan - Nurtec 75 mg for acute relief, then once every other day for prophylaxis - Follow up in 2-3 weeks

## 2023-02-01 NOTE — Assessment & Plan Note (Signed)
She smokes marijuana daily, which she describes as self-medication to stabilize her mood and improve her anxiety. She has been doing this for years and feel overall it helps. She does note that she has several episodes of emesis weekly, chronically, which she refers to as her "purges." These episodes are spontaneous and unintentional. I spoke with her about Cannabinoid hyperemesis syndrome, and she has agreed to slowly cut down her marijuana use. I reassured her that we can help manage any symptoms that arise as a side effect of decreasing the quantity of marijuana use.   Plan - Follow up in 2-3 weeks on marijuana use, emesis episodes, and mood/anxiety symptoms

## 2023-02-01 NOTE — Progress Notes (Signed)
CC: migraines  HPI:  [name] is a 37 y.o. female living with a history stated below and presents today for migraines. Please see problem based assessment and plan for additional details.  Past Medical History:  Diagnosis Date   Anxiety    Cannabinoid hyperemesis syndrome 02/01/2023   Fainting 2004   triggered by bright lights   No current outpatient medications on file prior to visit.   No current facility-administered medications on file prior to visit.   Family History  Problem Relation Age of Onset   Hypertension Mother    Diabetes Mother    Hyperlipidemia Mother    Stroke Mother    Other Mother    Heart disease Mother    Asthma Father    Heart disease Maternal Grandmother    Breast cancer Maternal Grandmother    Cervical cancer Maternal Grandmother    Hypertension Maternal Grandmother    Diabetes Maternal Grandmother    Diabetes Maternal Grandfather    Heart disease Maternal Grandfather    Hyperlipidemia Maternal Grandfather    Social History   Socioeconomic History   Marital status: Single    Spouse name: Not on file   Number of children: Not on file   Years of education: Not on file   Highest education level: Not on file  Occupational History   Not on file  Tobacco Use   Smoking status: Every Day    Current packs/day: 0.25    Average packs/day: 0.3 packs/day for 20.0 years (5.0 ttl pk-yrs)    Types: Cigarettes   Smokeless tobacco: Never   Tobacco comments:    5 cig a day  Vaping Use   Vaping status: Never Used  Substance and Sexual Activity   Alcohol use: No   Drug use: Yes    Frequency: 7.0 times per week    Types: Marijuana   Sexual activity: Not Currently    Birth control/protection: Implant  Other Topics Concern   Not on file  Social History Narrative   Right handed    Caffeine 1 daily   Lives in one story home live in client    Social Drivers of Health   Financial Resource Strain: Low Risk  (08/17/2021)   Overall  Financial Resource Strain (CARDIA)    Difficulty of Paying Living Expenses: Not hard at all  Food Insecurity: No Food Insecurity (08/17/2021)   Hunger Vital Sign    Worried About Running Out of Food in the Last Year: Never true    Ran Out of Food in the Last Year: Never true  Transportation Needs: No Transportation Needs (08/17/2021)   PRAPARE - Administrator, Civil Service (Medical): No    Lack of Transportation (Non-Medical): No  Physical Activity: Inactive (08/17/2021)   Exercise Vital Sign    Days of Exercise per Week: 0 days    Minutes of Exercise per Session: 0 min  Stress: Not on file  Social Connections: Not on file  Intimate Partner Violence: Not on file   Review of Systems: ROS negative except for what is noted on the assessment and plan.  Vitals:   02/01/23 0948  BP: (!) 134/51  Pulse: 88  Temp: 97.9 F (36.6 C)  TempSrc: Oral  SpO2: 100%  Weight: (!) 329 lb 11.2 oz (149.6 kg)  Height: 5\' 7"  (1.702 m)   Physical Exam: Constitutional: well appearing, no acute distress HENT: unremarkable Eyes: unremarkable; no conjunctival injection, EOM in tact Cardiovascular: regular rate and rhythm,  no m/r/g Pulmonary/Chest: CTA bilaterally, normal respiratory effort Neurological: alert & oriented x 3, no focal deficits Psych: normal mood and behavior  Assessment & Plan:   Patient seen with Dr. Sol Blazing  Migraines She has a long history of chronic migraines with vision changes. She was recently seen in the ED for this and was given a migraine cocktail, which improved her symptoms. An MRI at that time was unremarkable. She recently saw an ophthalmologist and her eye exam was normal. She reports that she has continued to have constant daily migraines for the past few weeks since her ED evaluation. Prior to that visit, she says her migraines were daily as well. She has been taking Excedrin with no relief. She has not taken anything else for her symptoms, but has been making  environmental changes and mostly lives in the dark. She reports intermittent left-sided vision loss, which is a common aura with her migraines. She feels her current migraines are similar in quality to her normal ones and denies any new symptoms. She has not tried any prescription migraine medications in the past. She previously tried propranolol for performance anxiety, but is not currently taking it. This is an option for prophylaxis in the future if Nurtec does not help.   Plan - Nurtec 75 mg for acute relief, then once every other day for prophylaxis - Follow up in 2-3 weeks  OSA (obstructive sleep apnea) Previously had a sleep study demonstrating OSA. Has a CPAP machine that she has been unable to tolerate, as she feels like it suffocates her and wakes up at night. She has tried the nasal device, but was unable to tolerate it as well due to anxiety and feeling like she was getting tangled up in it. I spoke with her about the benefit of using CPAP, as it may be contributing to her headaches, daytime fatigue, and hypertension. For now we will focus on improving her headaches and cutting down on her marijuana use. We will follow up on this at future visits.  Cannabinoid hyperemesis syndrome She smokes marijuana daily, which she describes as self-medication to stabilize her mood and improve her anxiety. She has been doing this for years and feel overall it helps. She does note that she has several episodes of emesis weekly, chronically, which she refers to as her "purges." These episodes are spontaneous and unintentional. I spoke with her about Cannabinoid hyperemesis syndrome, and she has agreed to slowly cut down her marijuana use. I reassured her that we can help manage any symptoms that arise as a side effect of decreasing the quantity of marijuana use.   Plan - Follow up in 2-3 weeks on marijuana use, emesis episodes, and mood/anxiety symptoms  Healthcare maintenance Gave Flu vaccine and Tdap  booster today  Annett Fabian, MD  [facility] Internal Medicine, PGY-1 Phone: [phone] Date 02/01/2023 Time 10:55 AM

## 2023-02-01 NOTE — Assessment & Plan Note (Signed)
Gave Flu vaccine and Tdap booster today

## 2023-02-01 NOTE — Assessment & Plan Note (Addendum)
Previously had a sleep study demonstrating OSA. Has a CPAP machine that she has been unable to tolerate, as she feels like it suffocates her and wakes up at night. She has tried the nasal device, but was unable to tolerate it as well due to anxiety and feeling like she was getting tangled up in it. I spoke with her about the benefit of using CPAP, as it may be contributing to her headaches, daytime fatigue, and hypertension. For now we will focus on improving her headaches and cutting down on her marijuana use. We will follow up on this at future visits.

## 2023-02-04 ENCOUNTER — Telehealth: Payer: Self-pay

## 2023-02-04 NOTE — Progress Notes (Signed)
 Internal Medicine Clinic Attending  I saw and evaluated the patient.  I personally confirmed the key portions of the history and exam documented by Dr. Versie Starks and I reviewed pertinent patient test results.  The assessment, diagnosis, and plan were formulated together and I agree with the documentation in the resident's note.

## 2023-02-04 NOTE — Telephone Encounter (Signed)
Pt states insurance will not covered Rimegepant Sulfate (NURTEC) 75 MG TBDP, requesting different medication.

## 2023-02-06 MED ORDER — SUMATRIPTAN SUCCINATE 50 MG PO TABS: 50.0000 mg | ORAL_TABLET | Freq: Every day | ORAL | 1 refills | Status: DC | PRN

## 2023-02-06 NOTE — Progress Notes (Signed)
Addendum: patient's insurance not covering Nurtec. Sent sumatriptan to her pharmacy and called her with instructions about how to take this medication. We will follow up on her symptoms and reconsider Nurtec if sumatriptan does not provide relief.

## 2023-02-06 NOTE — Addendum Note (Signed)
Addended by: Annett Fabian on: 02/06/2023 11:26 AM   Modules accepted: Orders

## 2023-02-28 ENCOUNTER — Ambulatory Visit (INDEPENDENT_AMBULATORY_CARE_PROVIDER_SITE_OTHER): Payer: Medicaid Other | Admitting: Student

## 2023-02-28 ENCOUNTER — Other Ambulatory Visit: Payer: Self-pay

## 2023-02-28 ENCOUNTER — Encounter: Payer: Self-pay | Admitting: Student

## 2023-02-28 VITALS — BP 121/88 | HR 75 | Temp 98.4°F | Ht 67.0 in | Wt 331.3 lb

## 2023-02-28 DIAGNOSIS — G8929 Other chronic pain: Secondary | ICD-10-CM | POA: Diagnosis not present

## 2023-02-28 DIAGNOSIS — G43E09 Chronic migraine with aura, not intractable, without status migrainosus: Secondary | ICD-10-CM

## 2023-02-28 DIAGNOSIS — Z131 Encounter for screening for diabetes mellitus: Secondary | ICD-10-CM

## 2023-02-28 DIAGNOSIS — Z6841 Body Mass Index (BMI) 40.0 and over, adult: Secondary | ICD-10-CM

## 2023-02-28 DIAGNOSIS — M25562 Pain in left knee: Secondary | ICD-10-CM | POA: Diagnosis not present

## 2023-02-28 DIAGNOSIS — F32A Depression, unspecified: Secondary | ICD-10-CM

## 2023-02-28 LAB — POCT GLYCOSYLATED HEMOGLOBIN (HGB A1C): Hemoglobin A1C: 5.5 % (ref 4.0–5.6)

## 2023-02-28 LAB — GLUCOSE, CAPILLARY: Glucose-Capillary: 85 mg/dL (ref 70–99)

## 2023-02-28 MED ORDER — NURTEC 75 MG PO TBDP: 75.0000 mg | ORAL_TABLET | ORAL | 1 refills | Status: DC

## 2023-02-28 NOTE — Assessment & Plan Note (Addendum)
 Reports ongoing migraines occurring almost daily. Severe migraines can last up to 72 hours. Always left sided with sensitivity to light and sounds. Has impacted her daily functions. Trial of sumatriptan  at last OV in 02/01/23 without any relief. Has been taking it consistently without any significant improvement. Rx sent for Nurtec given failed sumatriptan .   Plan -STOP sumatriptan   -Start Nurtec 75 mg every other day for prevention  -F/u visit in 1-2 months to reassess

## 2023-02-28 NOTE — Assessment & Plan Note (Signed)
 Follows with [facility]. Last seen in 06/2022. Received injections for left knee pain and pending further imaging which has not been completed. Patient to contact their office to reschedule appt. Discussed using voltaren gel to left knee up to 4 times daily. Agreeable to trying PT.   Plan -F/u with orthopedics  -Voltaren gel QID -Referral for PT

## 2023-02-28 NOTE — Patient Instructions (Signed)
 Thank you, [name] for allowing us  to provide your care today. Today we discussed   -Start Nurtec 75 mg every other day for prevention of migraines -Stop Imitrex  (sumatriptan ) -Make sure to follow up with orthopedics about your left knee -Try Voltaren gel to left knee up to 4 times a day -Referral for Physical therapy sent today -Referral for Psychiatry sent today -Checking for diabetes today    Referrals ordered today:  Referral Orders         Ambulatory referral to Psychiatry         Ambulatory referral to Physical Therapy      I have ordered the following medication/changed the following medications:   Stop the following medications: Medications Discontinued During This Encounter  Medication Reason   SUMAtriptan  (IMITREX ) 50 MG tablet     Start the following medications: Meds ordered this encounter  Medications   Rimegepant Sulfate  (NURTEC) 75 MG TBDP    Sig: Take 1 tablet (75 mg total) by mouth every other day.    Dispense:  16 tablet    Refill:  1    Has failed sumatriptan  therapy     Follow up:  1-2 months    Should you have any questions or concerns please call the internal medicine clinic at [phone].    Kristin Wright, D.O. [facility]

## 2023-02-28 NOTE — Assessment & Plan Note (Signed)
 Depression remains uncontrolled. PHQ-9 17 from 18. Denies any SI/HI. States was seen by therapist years ago and mentioned about bipolar disorder. States Kristin Wright still has low mood but also elevated/heightened mood at times. Kristin Wright is ready to seek help since it is impacting her daily function.   Plan -Referral to [facility] -Referral to psychiatry

## 2023-02-28 NOTE — Assessment & Plan Note (Signed)
 Body mass index is 51.89 kg/m. Screening A1c was normal today at 5.5%. Had discussions before about bariatric surgery. Consider GLP-1 is insurance covers. Reassess at next OV.

## 2023-02-28 NOTE — Progress Notes (Signed)
 CC: migraine f/u  HPI:  [name] is a 38 y.o. female living with a history stated below and presents today for migraine f/u. Please see problem based assessment and plan for additional details.  Past Medical History:  Diagnosis Date   Anxiety    Cannabinoid hyperemesis syndrome 02/01/2023   Fainting 2004   triggered by bright lights    No current outpatient medications on file prior to visit.   No current facility-administered medications on file prior to visit.    Family History  Problem Relation Age of Onset   Hypertension Mother    Diabetes Mother    Hyperlipidemia Mother    Stroke Mother    Other Mother    Heart disease Mother    Asthma Father    Heart disease Maternal Grandmother    Breast cancer Maternal Grandmother    Cervical cancer Maternal Grandmother    Hypertension Maternal Grandmother    Diabetes Maternal Grandmother    Diabetes Maternal Grandfather    Heart disease Maternal Grandfather    Hyperlipidemia Maternal Grandfather     Social History   Socioeconomic History   Marital status: Single    Spouse name: Not on file   Number of children: Not on file   Years of education: Not on file   Highest education level: Not on file  Occupational History   Not on file  Tobacco Use   Smoking status: Every Day    Current packs/day: 0.25    Average packs/day: 0.3 packs/day for 20.0 years (5.0 ttl pk-yrs)    Types: Cigarettes   Smokeless tobacco: Never   Tobacco comments:    5 cig a day  Vaping Use   Vaping status: Never Used  Substance and Sexual Activity   Alcohol use: No   Drug use: Yes    Frequency: 7.0 times per week    Types: Marijuana   Sexual activity: Not Currently    Birth control/protection: Implant  Other Topics Concern   Not on file  Social History Narrative   Right handed    Caffeine  1 daily   Lives in one story home live in client    Social Drivers of Health   Financial Resource Strain: Low Risk  (08/17/2021)    Overall Financial Resource Strain (CARDIA)    Difficulty of Paying Living Expenses: Not hard at all  Food Insecurity: No Food Insecurity (08/17/2021)   Hunger Vital Sign    Worried About Running Out of Food in the Last Year: Never true    Ran Out of Food in the Last Year: Never true  Transportation Needs: No Transportation Needs (08/17/2021)   PRAPARE - Administrator, Civil Service (Medical): No    Lack of Transportation (Non-Medical): No  Physical Activity: Inactive (08/17/2021)   Exercise Vital Sign    Days of Exercise per Week: 0 days    Minutes of Exercise per Session: 0 min  Stress: Not on file  Social Connections: Not on file  Intimate Partner Violence: Not on file    Review of Systems: ROS negative except for what is noted on the assessment and plan.  Vitals:   02/28/23 1425  BP: 121/88  Pulse: 75  Temp: 98.4 F (36.9 C)  TempSrc: Oral  SpO2: 95%  Weight: (!) 331 lb 4.8 oz (150.3 kg)  Height: 5' 7 (1.702 m)   Physical Exam: Constitutional: well-appearing female sitting in chair, in no acute distress Cardiovascular: regular rate and rhythm Pulmonary/Chest:  normal work of breathing on room air MSK: normal ROM with left knee, no significant swelling compared to right Neurological: alert & oriented x 3, no focal deficits   Assessment & Plan:   Migraines Reports ongoing migraines occurring almost daily. Severe migraines can last up to 72 hours. Always left sided with sensitivity to light and sounds. Has impacted her daily functions. Trial of sumatriptan  at last OV in 02/01/23 without any relief. Has been taking it consistently without any significant improvement. Rx sent for Nurtec given failed sumatriptan .   Plan -STOP sumatriptan   -Start Nurtec 75 mg every other day for prevention  -F/u visit in 1-2 months to reassess   Pain in left knee Follows with [facility]. Last seen in 06/2022. Received injections for left knee pain and pending further imaging which  has not been completed. Patient to contact their office to reschedule appt. Discussed using voltaren gel to left knee up to 4 times daily. Agreeable to trying PT.   Plan -F/u with orthopedics  -Voltaren gel QID -Referral for PT  Morbid obesity with BMI of 50.0-59.9, adult (HCC) Body mass index is 51.89 kg/m. Screening A1c was normal today at 5.5%. Had discussions before about bariatric surgery. Consider GLP-1 is insurance covers. Reassess at next OV.   Depression Depression remains uncontrolled. PHQ-9 17 from 18. Denies any SI/HI. States was seen by therapist years ago and mentioned about bipolar disorder. States she still has low mood but also elevated/heightened mood at times. She is ready to seek help since it is impacting her daily function.   Plan -Referral to [facility] -Referral to psychiatry    Patient discussed with Dr. Guilloud  Kristin Wright, D.O. [facility], PGY-2 Phone: [phone] Date 02/28/2023 Time 8:16 PM

## 2023-03-01 ENCOUNTER — Telehealth: Payer: Self-pay

## 2023-03-01 ENCOUNTER — Encounter: Payer: Commercial Managed Care - HMO | Admitting: Student

## 2023-03-01 NOTE — Progress Notes (Signed)
 Internal Medicine Clinic Attending  Case discussed with the resident at the time of the visit.  We reviewed the resident's history and exam and pertinent patient test results.  I agree with the assessment, diagnosis, and plan of care documented in the resident's note.

## 2023-03-01 NOTE — Telephone Encounter (Signed)
 Prior Authorization for patient (Nurtec 75MG  dispersible tablets) came through on cover my meds was submitted with last office notes awaiting approval or denial.  WUJ:[id number]

## 2023-03-04 NOTE — Telephone Encounter (Signed)
 Per patients insurance, patient must have tried and failed beta blockers like metoprolol,propranolol,timolol,topiramate,divalproex er,divalproex dr,amitriptyline or venlafaxine.

## 2023-03-04 NOTE — Telephone Encounter (Signed)
 Decision:Denied  Kristin Wright ([name]) PA Case ID #: [id number] Rx #: [id number] Need Help? Call us  at [phone] Outcome Denied on January 10 by [employer] HIX 2017 Denied Drug Nurtec 75MG  dispersible tablets ePA cloud logo Form [employer] / Chief Of Staff Prior Authorization Form Original Claim Info 7X,75  FYI I am waiting on more information on why the PA has been denied.

## 2023-03-12 ENCOUNTER — Other Ambulatory Visit: Payer: Self-pay

## 2023-03-12 ENCOUNTER — Ambulatory Visit: Payer: Medicaid Other | Attending: Internal Medicine

## 2023-03-12 DIAGNOSIS — M6281 Muscle weakness (generalized): Secondary | ICD-10-CM | POA: Insufficient documentation

## 2023-03-12 DIAGNOSIS — G8929 Other chronic pain: Secondary | ICD-10-CM | POA: Diagnosis present

## 2023-03-12 DIAGNOSIS — M25562 Pain in left knee: Secondary | ICD-10-CM | POA: Diagnosis present

## 2023-03-12 NOTE — Therapy (Addendum)
OUTPATIENT PHYSICAL THERAPY LOWER EXTREMITY EVALUATION   Patient Name: Kristin Wright MRN: [id number] DOB:10/26/1985, 38 y.o., female Today's Date: 03/12/2023  END OF SESSION:  PT End of Session - 03/12/23 1258     Visit Number 1    Number of Visits 17    Date for PT Re-Evaluation 05/07/23    Authorization Type [place] MEDICAID PREPAID HEALTH PLAN    PT Start Time 1015    PT Stop Time 1055    PT Time Calculation (min) 40 min    Activity Tolerance Patient tolerated treatment well    Behavior During Therapy WFL for tasks assessed/performed             Past Medical History:  Diagnosis Date   Anxiety    Cannabinoid hyperemesis syndrome 02/01/2023   Fainting 2004   triggered by bright lights   Past Surgical History:  Procedure Laterality Date   FASCIOTOMY Left 08/27/2019   Procedure: left leg anterior/lateral compartmental release;  Surgeon: Cammy Copa, MD;  Location: [facility] OR;  Service: Orthopedics;  Laterality: Left;   Patient Active Problem List   Diagnosis Date Noted   Migraines 02/01/2023   Cannabinoid hyperemesis syndrome 02/01/2023   Healthcare maintenance 02/01/2023   Pain in left knee 07/02/2022   Anxiety 02/07/2022   OSA (obstructive sleep apnea) 12/18/2021   Tobacco use 12/18/2021   Hyperlipidemia 08/23/2021   Contraception management 08/23/2021   Stress incontinence in female 08/23/2021   Depression 08/23/2021   Atypical chest pain 08/23/2021   Morbid obesity with BMI of 50.0-59.9, adult (HCC) 08/23/2021   Daytime somnolence 08/23/2021    PCP: Annett Fabian, MD  REFERRING PROVIDER: Reymundo Poll, MD  REFERRING DIAG: [phone] (ICD-10-CM) - Chronic pain of left knee   THERAPY DIAG:  Chronic pain of left knee  Muscle weakness (generalized)  Rationale for Evaluation and Treatment: Rehabilitation  ONSET DATE: Chronic, 2020  SUBJECTIVE:   SUBJECTIVE STATEMENT: Patient reports to PT with c/c of left knee and hip pain. Started in  2020 when she had compartment syndrome in the left leg. Had a fasciotomy in 2021 and since then the left knee has constantly hurt and the pain is beginning to move to hip. She had a cortisol shot in 2023 that helped some. Stairs are very bothersome to her and she notices a waddle in her gait. Patient wears a wrap around knee brace until the leg swells then she has to take it off. She reports she wakes up with swelling and it typically lasts all day unless she can rest. Swelling does move down to the left foot. She has numbness in the left leg.   PERTINENT HISTORY: Compartment Syndrome  Fasciotomy: 08/27/2019 PAIN:  Are you having pain?  Yes: NPRS scale: 7/10 on average, 10/10 at absolute worst. Pain location: in the left knee and the hip  Pain description: constant  Aggravating factors: everything Relieving factors: slight relief with rest  PRECAUTIONS: None  RED FLAGS: None   WEIGHT BEARING RESTRICTIONS: None  FALLS:  Has patient fallen in last 6 months? Yes. Number of falls 2, both times occurred going up the stairs too quickly   LIVING ENVIRONMENT: Lives with: lives with their family and lives with an adult companion Lives in: House/apartment Stairs: Yes, External, 3 steps outside Has following equipment at home: Single point cane and only uses if hip pain is uncontrollable.   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  PATIENT SURVEYS:  FOTO 47%, 63% predicted   COGNITION: Overall cognitive status: Within functional limits for tasks assessed    EDEMA:  Notable edema in ankle and knee.  POSTURE: rounded shoulders and forward head  PALPATION: Palpation to hip, quad, knee, and calf.  LOWER EXTREMITY ROM:  Active ROM Right eval Left eval  Hip flexion    Hip extension     Hip abduction    Hip adduction    Hip internal rotation    Hip external rotation    Knee flexion 110 85  Knee extension -2 0  Ankle dorsiflexion 10 4  Ankle plantarflexion 55 50  Ankle inversion    Ankle eversion     (Blank rows = not tested)  LOWER EXTREMITY MMT:  MMT Right eval Left eval  Hip flexion 3 3  Hip extension    Hip abduction    Hip adduction    Hip internal rotation    Hip external rotation    Knee flexion 4 4  Knee extension 4 3  Ankle dorsiflexion 3 3  Ankle plantarflexion 4 4  Ankle inversion    Ankle eversion     (Blank rows = not tested)  FUNCTIONAL TESTS:  30 seconds chair stand test: 7  GAIT: Distance walked: 40 Assistive device utilized: Single point cane when needed if hip hurts  Level of assistance: Complete Independence Comments: Patient has slight limitation in knee flexion during gait and hesitation in WB on left side.                                                                                                                     TREATMENT DATE:  03/12/23 Therapeutic Exercise:  Seated heel slides x15 Ankle pumps x20  Thomas stretch 2x30" SLR x15   PATIENT EDUCATION:  Education details: Exam findings, POC, HEP.  Person educated: Patient Education method: Explanation, Demonstration, Tactile cues, Verbal cues, and Handouts Education comprehension: verbalized understanding, returned demonstration, tactile cues required, and needs further education  HOME EXERCISE PROGRAM: Access Code: [email or web address]  ASSESSMENT:  CLINICAL IMPRESSION: Patient is a 38 y.o. female who was seen today for physical therapy evaluation and treatment for left knee pain following a fasciotomy in her left leg. Patient exhibits weaknesses in hip flexion, knee extension, and ankle dorsiflexion. Muscular tightness and pain are contributing factors to deficits. HEP introduced to work on strength deficits. Patient would benefit from skilled PT in order to decrease pain  and increase strength for functional activity.  OBJECTIVE IMPAIRMENTS: Abnormal gait, difficulty walking, decreased ROM, decreased strength, increased edema, and pain.   ACTIVITY LIMITATIONS: sitting, standing, squatting, sleeping, and stairs  PARTICIPATION LIMITATIONS: community activity and occupation  PERSONAL FACTORS: Fitness, Past/current experiences, and Time since onset of injury/illness/exacerbation are also affecting patient's functional outcome.   REHAB POTENTIAL: Good  CLINICAL DECISION MAKING: Stable/uncomplicated  EVALUATION COMPLEXITY: Moderate  GOALS:  Goals reviewed with patient? Yes  SHORT TERM GOALS: Target date: 04/09/2023  Patient will be I with initial HEP in order to progress with therapy. Baseline: Goal status: INITIAL  2.  Patient will score </= 8/10 at worse on the NPS by 3rd visit in order to show an improvement in pain allowing for an increase in functional ability.   Baseline: 12/10 Goal status: INITIAL  LONG TERM GOALS: Target date: 05/07/2023  Patient will be independent with final HEP in order to continue treatment at home for pain tolerance.  Baseline:  Goal status: INITIAL  2.  Patient will score at least 63% on FOTO to signify clinically meaningful improvement in functional abilities.  Baseline: 47% Goal status: INITIAL  3.  Patient will increase strength in left LE to a 4/5 to show an increase in strength.  Baseline: 3/5 Goal status: INITIAL  4.  Patient will increase knee flexion ROM by at least 15 degrees to increase functional ability.  Baseline:  80 degrees Goal status: INITIAL  5. Patient will score </= 5/10 at worse on the NPS to show an improvement in pain allowing for an increase in functional ability.   Baseline: 10/10  Goal Status: INITIAL   PLAN:  PT FREQUENCY: 2x/week  PT DURATION: 8 weeks  PLANNED INTERVENTIONS: 97164- PT Re-evaluation, 97110-Therapeutic exercises, 97530- Therapeutic activity, 97112- Neuromuscular  re-education, 97535- Self Care, 16109- Manual therapy, [phone]- Gait training, [phone]- Vasopneumatic device, Patient/Family education, Stair training, Dry Needling, Joint mobilization, Joint manipulation, Spinal manipulation, Spinal mobilization, Moist heat, and Biofeedback  PLAN FOR NEXT SESSION: Review and revise HEP, continue LE strengthening of left leg, look at hip abduction strength.   [employer] Authorization   Choose one: Rehabilitative  Standardized Assessment or Functional Outcome Tool: FOTO  Score or Percent Disability: 47% function, 63% predicted  Body Parts Treated (Select each separately):  Knee. Overall deficits/functional limitations for body part selected: moderate Hip. Overall deficits/functional limitations for body part selected: moderate Ankle. Overall deficits/functional limitations for body part selected: mild   If treatment provided at initial evaluation, no treatment charged due to lack of authorization.    Erin Hearing, Student-PT 03/12/2023, 2:26 PM

## 2023-03-12 NOTE — Patient Instructions (Signed)
Access Code: [email or web address]

## 2023-03-18 ENCOUNTER — Ambulatory Visit (INDEPENDENT_AMBULATORY_CARE_PROVIDER_SITE_OTHER): Payer: Medicaid Other | Admitting: Licensed Clinical Social Worker

## 2023-03-18 DIAGNOSIS — F12151 Cannabis abuse with psychotic disorder with hallucinations: Secondary | ICD-10-CM

## 2023-03-18 NOTE — BH Specialist Note (Unsigned)
Integrated Behavioral Health Initial In-Person Visit  MRN: [id number] Name: Kristin Wright  Number of Integrated Behavioral Health Clinician visits: No data recorded Session Start time: 1530    Session End time: 1630  Total time in minutes: 60   Types of Service: {CHL AMB TYPE OF SERVICE:[phone]}  Interpretor:{yes AO:130865} Interpretor Name and Language: ***   Warm Hand Off Completed.        Subjective: Kristin Wright is a 38 y.o. female accompanied by {CHL AMB ACCOMPANIED HQ:[id number]} Patient was referred by *** for ***. Patient reports the following symptoms/concerns: LCSW-A, [facility] met with Kristin Wright, a 38 year old female, on today's date. The patient has a 18 year old son who currently resides with his grandmother due to Kristin [name] reported sensitivity to noise, light, and social interactions. Kristin Wright reports using marijuana as a form of self-medication. Her current usage has decreased from four blunts daily to two blunts and vaping twice daily with one pull each time. This pattern of use is consistent with Cannabis Use Disorder. The patient disclosed a history of mental health treatment, starting with therapy at age 43 or 68. She believes her mental health issues may have originated from an abortion she had during her teenage years. Kristin Wright also reported a family history of mental health concerns, mentioning that her father had attempted suicide. Kristin Wright described experiencing visual hallucinations, including seeing figures and having conversations with them. She reported distressing experiences of entities sitting on her chest and commanding her to harm herself and others. These hallucinations have persisted for over 10 years and reportedly worsened after the birth of her son. The patient also described periods of hypersexuality and excessive spending, which may be indicative of manic episodes. The patient reported being diagnosed with bipolar disorder at age  88. Given her described symptoms, including persistent hallucinations, mood fluctuations, and cannabis use, there is a possibility of Schizoaffective Disorder, Bipolar Type. However, a comprehensive psychiatric evaluation would be necessary to confirm this diagnosis. [name] expressed hesitancy about taking medications. Her next appointment is scheduled for March 4th with Dr. Frederich Cha at [facility]. Treatment recommendations include continued monitoring of cannabis use and exploration of alternative coping strategies. Given the severity and duration of her symptoms, a referral for a comprehensive psychiatric evaluation is strongly recommended to clarify her diagnosis and explore appropriate treatment options, including potential medication management. Additionally, psychoeducation about the potential impact of cannabis use on her mental health symptoms should be provided. Duration of problem: ***; Severity of problem: {Mild/Moderate/Severe:20260}  Objective: Mood: {BHH MOOD:22306} and Affect: {BHH AFFECT:22307} Risk of harm to self or others: {CHL AMB BH Suicide Current Mental Status:21022748}  Life Context: Family and Social: *** School/Work: *** Self-Care: *** Life Changes: ***  Patient and/or Family's Strengths/Protective Factors: {CHL AMB BH PROTECTIVE FACTORS:[phone]}  Goals Addressed: Patient will: Reduce symptoms of: {IBH Symptoms:21014056} Increase knowledge and/or ability of: {IBH Patient Tools:21014057}  Demonstrate ability to: {IBH Goals:21014053}  Progress towards Goals: {CHL AMB BH PROGRESS TOWARDS [phone]}  Interventions: Interventions utilized: {IBH Interventions:21014054}  Standardized Assessments completed: {IBH Screening Tools:21014051}  Patient and/or Family Response: ***  Patient Centered Plan: Patient is on the following Treatment Plan(s):  ***  Assessment: Patient currently experiencing ***.   Patient may benefit from  ***.  Plan: Follow up with behavioral health clinician on : *** Behavioral recommendations: *** Referral(s): {IBH Referrals:21014055} "From scale of 1-10, how likely are you to follow plan?": ***  Kristin Wright, MSW, LCSW-A She/Her Behavioral Health Clinician  [facility]  Internal Medicine Center Direct Dial:[phone]  Fax [phone] Main Office Phone: [phone] [address] [place] [place], [place] [address] Website: [facility] Internal Medicine [facility]  [place], [place]  [facility]

## 2023-03-20 ENCOUNTER — Telehealth: Payer: Self-pay

## 2023-03-20 ENCOUNTER — Ambulatory Visit: Payer: Medicaid Other

## 2023-03-20 NOTE — Therapy (Incomplete)
OUTPATIENT PHYSICAL THERAPY TREATMENT   Patient Name: Kristin Wright MRN: [id number] DOB:18-Apr-1985, 38 y.o., female Today's Date: 03/20/2023  END OF SESSION:    Past Medical History:  Diagnosis Date   Anxiety    Cannabinoid hyperemesis syndrome 02/01/2023   Fainting 2004   triggered by bright lights   Past Surgical History:  Procedure Laterality Date   FASCIOTOMY Left 08/27/2019   Procedure: left leg anterior/lateral compartmental release;  Surgeon: Cammy Copa, MD;  Location: [facility] OR;  Service: Orthopedics;  Laterality: Left;   Patient Active Problem List   Diagnosis Date Noted   Migraines 02/01/2023   Cannabinoid hyperemesis syndrome 02/01/2023   Healthcare maintenance 02/01/2023   Pain in left knee 07/02/2022   Anxiety 02/07/2022   OSA (obstructive sleep apnea) 12/18/2021   Tobacco use 12/18/2021   Hyperlipidemia 08/23/2021   Contraception management 08/23/2021   Stress incontinence in female 08/23/2021   Depression 08/23/2021   Atypical chest pain 08/23/2021   Morbid obesity with BMI of 50.0-59.9, adult (HCC) 08/23/2021   Daytime somnolence 08/23/2021    PCP: Annett Fabian, MD  REFERRING PROVIDER: Reymundo Poll, MD  REFERRING DIAG: [phone] (ICD-10-CM) - Chronic pain of left knee   THERAPY DIAG:  No diagnosis found.  Rationale for Evaluation and Treatment: Rehabilitation  ONSET DATE: Chronic, 2020  SUBJECTIVE:   SUBJECTIVE STATEMENT: ***  EVAL: Patient reports to PT with c/c of left knee and hip pain. Started in 2020 when she had compartment syndrome in the left leg. Had a fasciotomy in 2021 and since then the left knee has constantly hurt and the pain is beginning to move to hip. She had a cortisol shot in 2023 that helped some. Stairs are very bothersome to her and she notices a waddle in her gait. Patient wears a wrap around knee brace until the leg swells then she has to take it off. She reports she wakes up with swelling and it  typically lasts all day unless she can rest. Swelling does move down to the left foot. She has numbness in the left leg.   PERTINENT HISTORY: Compartment Syndrome  Fasciotomy: 08/27/2019 PAIN:  Are you having pain?  Yes: NPRS scale: 7/10 on average, 10/10 at absolute worst. Pain location: in the left knee and the hip  Pain description: constant  Aggravating factors: everything Relieving factors: slight relief with rest  PRECAUTIONS: None  RED FLAGS: None   WEIGHT BEARING RESTRICTIONS: None  FALLS:  Has patient fallen in last 6 months? Yes. Number of falls 2, both times occurred going up the stairs too quickly   LIVING ENVIRONMENT: Lives with: lives with their family and lives with an adult companion Lives in: House/apartment Stairs: Yes, External, 3 steps outside Has following equipment at home: Single point cane and only uses if hip pain is uncontrollable.   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  PATIENT SURVEYS:  FOTO 47%, 63% predicted   COGNITION: Overall cognitive status: Within functional limits for tasks assessed    EDEMA:  Notable edema in ankle and knee.  POSTURE: rounded shoulders and forward head  PALPATION: Palpation to hip, quad, knee, and calf.  LOWER EXTREMITY ROM:  Active ROM Right eval Left eval  Hip flexion    Hip extension    Hip abduction    Hip adduction  Hip internal rotation    Hip external rotation    Knee flexion 110 85  Knee extension -2 0  Ankle dorsiflexion 10 4  Ankle plantarflexion 55 50  Ankle inversion    Ankle eversion     (Blank rows = not tested)  LOWER EXTREMITY MMT:  MMT Right eval Left eval  Hip flexion 3 3  Hip extension    Hip abduction    Hip adduction    Hip internal rotation    Hip external rotation     Knee flexion 4 4  Knee extension 4 3  Ankle dorsiflexion 3 3  Ankle plantarflexion 4 4  Ankle inversion    Ankle eversion     (Blank rows = not tested)  FUNCTIONAL TESTS:  30 seconds chair stand test: 7  GAIT: Distance walked: 40 Assistive device utilized: Single point cane when needed if hip hurts  Level of assistance: Complete Independence Comments: Patient has slight limitation in knee flexion during gait and hesitation in WB on left side.                                                                                                                     TREATMENT DATE:  03/20/23 Therapeutic Exercise:  Seated heel slides x15 Ankle pumps x20  Thomas stretch 2x30" SLR x15   PATIENT EDUCATION:  Education details: Exam findings, POC, HEP.  Person educated: Patient Education method: Explanation, Demonstration, Tactile cues, Verbal cues, and Handouts Education comprehension: verbalized understanding, returned demonstration, tactile cues required, and needs further education  HOME EXERCISE PROGRAM: Access Code: [email or web address]  ASSESSMENT:  CLINICAL IMPRESSION: ***  EVAL: Patient is a 38 y.o. female who was seen today for physical therapy evaluation and treatment for left knee pain following a fasciotomy in her left leg. Patient exhibits weaknesses in hip flexion, knee extension, and ankle dorsiflexion. Muscular tightness and pain are contributing factors to deficits. HEP introduced to work on strength deficits. Patient would benefit from skilled PT in order to decrease pain and increase strength for functional activity.  OBJECTIVE IMPAIRMENTS: Abnormal gait, difficulty walking, decreased ROM, decreased strength, increased edema, and pain.   ACTIVITY LIMITATIONS: sitting, standing, squatting, sleeping, and stairs  PARTICIPATION LIMITATIONS: community activity and occupation  PERSONAL FACTORS: Fitness, Past/current experiences, and Time since onset of injury/illness/exacerbation  are also affecting patient's functional outcome.   REHAB POTENTIAL: Good  CLINICAL DECISION MAKING: Stable/uncomplicated  EVALUATION COMPLEXITY: Moderate  GOALS: Goals reviewed with patient? Yes  SHORT TERM GOALS: Target date: 04/09/2023  Patient will be I with initial HEP in order to progress with therapy. Baseline: Goal status: INITIAL  2.  Patient will score </= 8/10 at worse on the NPS by 3rd visit in order to show an improvement in pain allowing for an increase in functional ability.   Baseline: 12/10 Goal status: INITIAL  LONG TERM GOALS: Target date: 05/07/2023  Patient will be independent with final HEP in order to continue treatment at home for pain tolerance.  Baseline:  Goal status: INITIAL  2.  Patient will score at least 63% on FOTO to signify clinically meaningful improvement in functional abilities.  Baseline: 47% Goal status: INITIAL  3.  Patient will increase strength in left LE to a 4/5 to show an increase in strength.  Baseline: 3/5 Goal status: INITIAL  4.  Patient will increase knee flexion ROM by at least 15 degrees to increase functional ability.  Baseline:  80 degrees Goal status: INITIAL  5. Patient will score </= 5/10 at worse on the NPS to show an improvement in pain allowing for an increase in functional ability.   Baseline: 10/10  Goal Status: INITIAL   PLAN:  PT FREQUENCY: 2x/week  PT DURATION: 8 weeks  PLANNED INTERVENTIONS: 97164- PT Re-evaluation, 97110-Therapeutic exercises, 97530- Therapeutic activity, 97112- Neuromuscular re-education, 97535- Self Care, 09604- Manual therapy, [phone]- Gait training, [phone]- Vasopneumatic device, Patient/Family education, Stair training, Dry Needling, Joint mobilization, Joint manipulation, Spinal manipulation, Spinal mobilization, Moist heat, and Biofeedback  PLAN FOR NEXT SESSION: Review and revise HEP, continue LE strengthening of left leg, look at hip abduction strength.   [employer] Authorization    Choose one: Rehabilitative  Standardized Assessment or Functional Outcome Tool: FOTO  Score or Percent Disability: 47% function, 63% predicted  Body Parts Treated (Select each separately):  Knee. Overall deficits/functional limitations for body part selected: moderate Hip. Overall deficits/functional limitations for body part selected: moderate Ankle. Overall deficits/functional limitations for body part selected: mild   If treatment provided at initial evaluation, no treatment charged due to lack of authorization.    Erin Hearing, Student-PT 03/20/2023, 3:02 PM

## 2023-03-20 NOTE — Telephone Encounter (Signed)
PT called and spoke with patient regarding missed appointment. Reminder of next appointment given.   Kristin Wright   03/20/23 4:36 PM

## 2023-03-26 ENCOUNTER — Ambulatory Visit: Payer: Medicaid Other | Attending: Internal Medicine

## 2023-03-26 DIAGNOSIS — M6281 Muscle weakness (generalized): Secondary | ICD-10-CM | POA: Insufficient documentation

## 2023-03-26 DIAGNOSIS — M25562 Pain in left knee: Secondary | ICD-10-CM | POA: Diagnosis present

## 2023-03-26 DIAGNOSIS — G8929 Other chronic pain: Secondary | ICD-10-CM | POA: Insufficient documentation

## 2023-03-26 NOTE — Therapy (Addendum)
 OUTPATIENT PHYSICAL THERAPY TREATMENT NOTE/DISCHARGE  PHYSICAL THERAPY DISCHARGE SUMMARY  Visits from Start of Care: 2  Current functional level related to goals / functional outcomes: See goals/objective   Remaining deficits: Unable to assess   Education / Equipment: HEP   Patient agrees to discharge. Patient goals were unable to assess. Patient is being discharged due to not returning since the last visit.     Patient Name: Kristin Wright MRN: [id number] DOB:11-27-1985, 38 y.o., female Today's Date: 03/26/2023  END OF SESSION:  PT End of Session - 03/26/23 1314     Visit Number 2    Number of Visits 17    Date for PT Re-Evaluation 05/07/23    Authorization Type [place] MEDICAID PREPAID HEALTH PLAN    PT Start Time 1315    PT Stop Time 1357    PT Time Calculation (min) 42 min    Activity Tolerance Patient tolerated treatment well    Behavior During Therapy [facility] for tasks assessed/performed              Past Medical History:  Diagnosis Date   Anxiety    Cannabinoid hyperemesis syndrome 02/01/2023   Fainting 2004   triggered by bright lights   Past Surgical History:  Procedure Laterality Date   FASCIOTOMY Left 08/27/2019   Procedure: left leg anterior/lateral compartmental release;  Surgeon: Addie Cordella Hamilton, MD;  Location: [facility] OR;  Service: Orthopedics;  Laterality: Left;   Patient Active Problem List   Diagnosis Date Noted   Migraines 02/01/2023   Cannabinoid hyperemesis syndrome 02/01/2023   Healthcare maintenance 02/01/2023   Pain in left knee 07/02/2022   Anxiety 02/07/2022   OSA (obstructive sleep apnea) 12/18/2021   Tobacco use 12/18/2021   Hyperlipidemia 08/23/2021   Contraception management 08/23/2021   Stress incontinence in female 08/23/2021   Depression 08/23/2021   Atypical chest pain 08/23/2021   Morbid obesity with BMI of 50.0-59.9, adult (HCC) 08/23/2021   Daytime somnolence 08/23/2021    PCP: Gregary Sharper, MD  REFERRING PROVIDER:  Forest Coy, MD  REFERRING DIAG: [phone] (ICD-10-CM) - Chronic pain of left knee   THERAPY DIAG:  Chronic pain of left knee  Muscle weakness (generalized)  Rationale for Evaluation and Treatment: Rehabilitation  ONSET DATE: Chronic, 2020  SUBJECTIVE:   SUBJECTIVE STATEMENT: Pt presents to PT with reports of continued slight discomfort in L knee. Has been compliant with HEP with no adverse effect.   EVAL: Patient reports to PT with c/c of left knee and hip pain. Started in 2020 when she had compartment syndrome in the left leg. Had a fasciotomy in 2021 and since then the left knee has constantly hurt and the pain is beginning to move to hip. She had a cortisol shot in 2023 that helped some. Stairs are very bothersome to her and she notices a waddle in her gait. Patient wears a wrap around knee brace until the leg swells then she has to take it off. She reports she wakes up with swelling and it typically lasts all day unless she can rest. Swelling does move down to the left foot. She has numbness in the left leg.   PERTINENT HISTORY: Compartment Syndrome  Fasciotomy: 08/27/2019 PAIN:  Are you having pain?  Yes: NPRS scale: 7/10 on average, 10/10 at absolute worst. Pain location: in the left knee and the hip  Pain description: constant  Aggravating factors: everything Relieving factors: slight relief with rest  PRECAUTIONS: None  RED FLAGS: None   WEIGHT  BEARING RESTRICTIONS: None  FALLS:  Has patient fallen in last 6 months? Yes. Number of falls 2, both times occurred going up the stairs too quickly   LIVING ENVIRONMENT: Lives with: lives with their family and lives with an adult companion Lives in: House/apartment Stairs: Yes, External, 3 steps outside Has following equipment at home: Single point cane and only uses if hip pain is uncontrollable.   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  PATIENT SURVEYS:  FOTO 47%, 63% predicted   COGNITION: Overall cognitive status: Within functional limits for tasks assessed    EDEMA:  Notable edema in ankle and knee.  POSTURE: rounded shoulders and forward head  PALPATION: Palpation to hip, quad, knee, and calf.  LOWER EXTREMITY ROM:  Active ROM Right eval Left eval  Hip flexion    Hip extension    Hip abduction    Hip adduction    Hip internal rotation    Hip external rotation    Knee flexion 110 85  Knee extension -2 0  Ankle dorsiflexion 10 4  Ankle plantarflexion 55 50  Ankle inversion    Ankle eversion     (Blank rows = not tested)  LOWER EXTREMITY MMT:  MMT Right eval Left eval  Hip flexion 3 3  Hip extension    Hip abduction    Hip adduction    Hip internal rotation    Hip external rotation    Knee flexion 4 4  Knee extension 4 3  Ankle dorsiflexion 3 3  Ankle plantarflexion 4 4  Ankle inversion    Ankle eversion     (Blank rows = not tested)  FUNCTIONAL TESTS:  30 seconds chair stand test: 7                                                                                                                   TREATMENT: [facility] Adult PT Treatment:                                                  Therapeutic Exercise: Supine SLR 2x10 2# each Supine heel slide with strap x 15 L - 5 hold Slant board gastroc stretch 2x30 Lateral walk RTB x 3 laps at counter Standing TKE with red powercord 2x10 L Neuromuscular re-ed: Supine QS on L x 10 - 5 hold Seated wobble board DF/PF L 2x45 - focus on slow motor control Standing wobble board UE support 2x30 fwd/bwd Tandem stance L back 2x30  Therapeutic Activity: NuStep lvl 5 UE/LE x 4 min for aerobic endurance while taking subjective STS 2x10 - no UE support high table  Standing mini squat with UE support 2x10 - to  assist  with squatting and lifting tasks  PATIENT EDUCATION:  Education details: continue HEP Person educated: Patient Education method: Explanation, Demonstration, Tactile cues, Verbal cues, and Handouts Education comprehension: verbalized understanding, returned demonstration, tactile cues required, and needs further education  HOME EXERCISE PROGRAM: Access Code: [email or web address]  ASSESSMENT:  CLINICAL IMPRESSION: Pt was able to complete all prescribed exercises with no adverse effect. Therapy today focused improving LE strength, knee and ankle ROM, L LE balance and proprioception, and functional activities. No changes to HEP today. She continues to demonstrate decrease in L LE strength and knee ROM. PT will continue to progress as able per POC.   EVAL: Patient is a 38 y.o. female who was seen today for physical therapy evaluation and treatment for left knee pain following a fasciotomy in her left leg. Patient exhibits weaknesses in hip flexion, knee extension, and ankle dorsiflexion. Muscular tightness and pain are contributing factors to deficits. HEP introduced to work on strength deficits. Patient would benefit from skilled PT in order to decrease pain and increase strength for functional activity.  OBJECTIVE IMPAIRMENTS: Abnormal gait, difficulty walking, decreased ROM, decreased strength, increased edema, and pain.   ACTIVITY LIMITATIONS: sitting, standing, squatting, sleeping, and stairs  PARTICIPATION LIMITATIONS: community activity and occupation  PERSONAL FACTORS: Fitness, Past/current experiences, and Time since onset of injury/illness/exacerbation are also affecting patient's functional outcome.   REHAB POTENTIAL: Good  CLINICAL DECISION MAKING: Stable/uncomplicated  EVALUATION COMPLEXITY: Moderate  GOALS: Goals reviewed with patient? Yes  SHORT TERM GOALS: Target date: 04/09/2023  Patient will be I with initial HEP in order to progress with therapy. Baseline: Goal status:  INITIAL  2.  Patient will score </= 8/10 at worse on the NPS by 3rd visit in order to show an improvement in pain allowing for an increase in functional ability.   Baseline: 12/10 Goal status: INITIAL  LONG TERM GOALS: Target date: 05/07/2023  Patient will be independent with final HEP in order to continue treatment at home for pain tolerance.  Baseline:  Goal status: INITIAL  2.  Patient will score at least 63% on FOTO to signify clinically meaningful improvement in functional abilities.  Baseline: 47% Goal status: INITIAL  3.  Patient will increase strength in left LE to a 4/5 to show an increase in strength.  Baseline: 3/5 Goal status: INITIAL  4.  Patient will increase knee flexion ROM by at least 15 degrees to increase functional ability.  Baseline:  80 degrees Goal status: INITIAL  5. Patient will score </= 5/10 at worse on the NPS to show an improvement in pain allowing for an increase in functional ability.   Baseline: 10/10  Goal Status: INITIAL   PLAN:  PT FREQUENCY: 2x/week  PT DURATION: 8 weeks  PLANNED INTERVENTIONS: 97164- PT Re-evaluation, 97110-Therapeutic exercises, 97530- Therapeutic activity, 97112- Neuromuscular re-education, 97535- Self Care, [id number]- Manual therapy, [phone]- Gait training, [phone]- Vasopneumatic device, Patient/Family education, Stair training, Dry Needling, Joint mobilization, Joint manipulation, Spinal manipulation, Spinal mobilization, Moist heat, and Biofeedback  PLAN FOR NEXT SESSION: Review and revise HEP, continue LE strengthening of left leg, look at hip abduction strength.   [employer] Authorization   Choose one: Rehabilitative  Standardized Assessment or Functional Outcome Tool: FOTO  Score or Percent Disability: 47% function, 63% predicted  Body Parts Treated (Select each separately):  Knee. Overall deficits/functional limitations for body part selected: moderate Hip. Overall deficits/functional limitations for body part  selected: moderate Ankle. Overall deficits/functional limitations for body part selected: mild   If treatment  provided at initial evaluation, no treatment charged due to lack of authorization.    Alm JAYSON Kingdom PT  03/26/23 2:07 PM

## 2023-03-28 ENCOUNTER — Ambulatory Visit: Payer: Medicaid Other

## 2023-04-01 ENCOUNTER — Encounter: Payer: Self-pay | Admitting: Student

## 2023-04-01 ENCOUNTER — Ambulatory Visit (INDEPENDENT_AMBULATORY_CARE_PROVIDER_SITE_OTHER): Payer: Medicaid Other | Admitting: Student

## 2023-04-01 ENCOUNTER — Other Ambulatory Visit (HOSPITAL_COMMUNITY): Payer: Self-pay

## 2023-04-01 ENCOUNTER — Other Ambulatory Visit: Payer: Self-pay | Admitting: Student

## 2023-04-01 VITALS — BP 121/80 | HR 76 | Ht 67.0 in | Wt 325.7 lb

## 2023-04-01 DIAGNOSIS — F17210 Nicotine dependence, cigarettes, uncomplicated: Secondary | ICD-10-CM | POA: Diagnosis not present

## 2023-04-01 DIAGNOSIS — F1290 Cannabis use, unspecified, uncomplicated: Secondary | ICD-10-CM | POA: Diagnosis not present

## 2023-04-01 DIAGNOSIS — G43909 Migraine, unspecified, not intractable, without status migrainosus: Secondary | ICD-10-CM | POA: Diagnosis present

## 2023-04-01 DIAGNOSIS — R112 Nausea with vomiting, unspecified: Secondary | ICD-10-CM | POA: Diagnosis not present

## 2023-04-01 DIAGNOSIS — F32A Depression, unspecified: Secondary | ICD-10-CM | POA: Diagnosis not present

## 2023-04-01 DIAGNOSIS — G43E09 Chronic migraine with aura, not intractable, without status migrainosus: Secondary | ICD-10-CM

## 2023-04-01 DIAGNOSIS — Z720 Tobacco use: Secondary | ICD-10-CM

## 2023-04-01 MED ORDER — NURTEC 75 MG PO TBDP
75.0000 mg | ORAL_TABLET | ORAL | 1 refills | Status: DC
  Filled 2023-04-01: qty 16, 32d supply, fill #0
  Filled 2023-04-25: qty 8, 30d supply, fill #0

## 2023-04-01 MED ORDER — NICOTINE POLACRILEX 2 MG MT GUM: 2.0000 mg | CHEWING_GUM | OROMUCOSAL | Status: DC | PRN

## 2023-04-01 MED ORDER — PROPRANOLOL HCL 40 MG PO TABS
40.0000 mg | ORAL_TABLET | Freq: Two times a day (BID) | ORAL | 2 refills | Status: DC
  Filled 2023-04-01: qty 60, 30d supply, fill #0

## 2023-04-01 NOTE — Progress Notes (Signed)
 Internal Medicine Clinic Attending  Case discussed with the resident at the time of the visit.  We reviewed the resident's history and exam and pertinent patient test results.  I agree with the assessment, diagnosis, and plan of care documented in the resident's note.

## 2023-04-01 NOTE — Assessment & Plan Note (Addendum)
 She continues to to smoke cigarettes and feels like this has been worsening recently with a decrease in marijuana use.  She is now smoking a pack every few days, which is an increase from previously.  She has noted that she feels a little more short of breath than recently and would like to cut back on this.  She says that the nicotine  patches have not been helping.  I will prescribe her nicotine  gum and see if that helps.  We will revisit Chantix  after her formal psychiatric evaluation.

## 2023-04-01 NOTE — Assessment & Plan Note (Signed)
 She has decreased her marijuana intake to 1 blunt daily and has noted a significant improvement in her nausea and vomiting, further suggesting that this was likely related to her marijuana use.  I encouraged her to continue to limit her marijuana use to prevent further nausea and vomiting episodes.  This marijuana use may also be contributing to her hallucinatory experiences, further highlighting the importance of cessation.

## 2023-04-01 NOTE — Assessment & Plan Note (Addendum)
 Her depression is a chronic issue that she has been working on for years. She states that she feels like she is doing better after her behavioral health visit recently. She has another appointment with behavioral health on 2/13 and a psychiatry appointment in early March. Per the patient, she has a remote diagnosis of bipolar disorder, but it is unclear whether this is an official diagnosis.  She does note that she has both auditory and visual hallucinations and feels like her mood fluctuates dramatically in a cyclical pattern.  I encouraged her to continue to go to her behavioral health visits and her psychiatry evaluation in early March.  We will hold off on any medications until her formal psychiatric evaluation.

## 2023-04-01 NOTE — Patient Instructions (Signed)
 Thank you, Ms.Breia T Reichenberger for allowing us  to provide your care today. Today we discussed your migraines and marijuana use.    As we discussed, you may take your Nurtec more frequently, up to once every other day to prevent migraines.  I am glad that this has been helping you so much.  I am also glad that you have been having such good experience with Creasie Doctor at behavioral health.  Will be important for you to have your follow-up appointment with the psychiatrist as well to get a formal evaluation.  We will follow-up again after your appointment with the psychiatrist to see how you are doing.  At that time, we can discuss initiating Chantix  for smoking cessation.  In the meantime, please do your best to limit tobacco and marijuana use is much as you are able to.  Follow up: 2 months   We look forward to seeing you next time. Please call our clinic at [phone] if you have any questions or concerns. The best time to call is Monday-Friday from 9am-4pm, but there is someone available 24/7. If after hours or the weekend, call the main hospital number and ask for the Internal Medicine Resident On-Call. If you need medication refills, please notify your pharmacy one week in advance and they will send us  a request.   Thank you for trusting me with your care. Wishing you the best!   Dorthy Gavia, MD [facility]

## 2023-04-01 NOTE — Progress Notes (Signed)
 CC: f/u migraines  HPI:  [name] is a 38 y.o. female living with a history stated below and presents today for f/u migraines. Please see problem based assessment and plan for additional details.  Past Medical History:  Diagnosis Date   Anxiety    Cannabinoid hyperemesis syndrome 02/01/2023   Fainting 2004   triggered by bright lights    No current outpatient medications on file prior to visit.   No current facility-administered medications on file prior to visit.    Family History  Problem Relation Age of Onset   Hypertension Mother    Diabetes Mother    Hyperlipidemia Mother    Stroke Mother    Other Mother    Heart disease Mother    Asthma Father    Heart disease Maternal Grandmother    Breast cancer Maternal Grandmother    Cervical cancer Maternal Grandmother    Hypertension Maternal Grandmother    Diabetes Maternal Grandmother    Diabetes Maternal Grandfather    Heart disease Maternal Grandfather    Hyperlipidemia Maternal Grandfather     Social History   Socioeconomic History   Marital status: Single    Spouse name: Not on file   Number of children: Not on file   Years of education: Not on file   Highest education level: Not on file  Occupational History   Not on file  Tobacco Use   Smoking status: Every Day    Current packs/day: 0.25    Average packs/day: 0.3 packs/day for 20.0 years (5.0 ttl pk-yrs)    Types: Cigarettes   Smokeless tobacco: Never   Tobacco comments:    5 cig a day  Vaping Use   Vaping status: Never Used  Substance and Sexual Activity   Alcohol use: No   Drug use: Yes    Frequency: 7.0 times per week    Types: Marijuana   Sexual activity: Not Currently    Birth control/protection: Implant  Other Topics Concern   Not on file  Social History Narrative   Right handed    Caffeine  1 daily   Lives in one story home live in client    Social Drivers of Health   Financial Resource Strain: Low Risk  (08/17/2021)    Overall Financial Resource Strain (CARDIA)    Difficulty of Paying Living Expenses: Not hard at all  Food Insecurity: No Food Insecurity (08/17/2021)   Hunger Vital Sign    Worried About Running Out of Food in the Last Year: Never true    Ran Out of Food in the Last Year: Never true  Transportation Needs: No Transportation Needs (08/17/2021)   PRAPARE - Administrator, Civil Service (Medical): No    Lack of Transportation (Non-Medical): No  Physical Activity: Inactive (08/17/2021)   Exercise Vital Sign    Days of Exercise per Week: 0 days    Minutes of Exercise per Session: 0 min  Stress: Not on file  Social Connections: Not on file  Intimate Partner Violence: Not on file    Review of Systems: ROS negative except for what is noted on the assessment and plan.  Vitals:   04/01/23 0940  BP: 121/80  Pulse: 76  SpO2: 100%  Weight: (!) 325 lb 11.2 oz (147.7 kg)  Height: 5\' 7"  (1.702 m)    Physical Exam: Constitutional: well-appearing, in no acute distress Cardiovascular: regular rate and rhythm, no m/r/g Pulmonary/Chest: normal work of breathing on room air, lungs clear to  auscultation bilaterally Neurological: alert & oriented x 3, no focal deficits Psych: normal mood and behavior; reports active auditory and visual hallucinations  Assessment & Plan:   Patient discussed with Dr. Lanetta Pion  Migraines Patient with a history of left-sided migraines almost every day.  Previously tried sumatriptan  with no relief, recently started on Nurtec 75 mg every other day for acute migraine treatment and prophylaxis.  She says this has been going really well and her migraines are much improved from previously.  She has been taking Nurtec every Monday and Wednesday morning and supplementing with Tylenol  as needed for her headaches.  I spoke with her about increasing this to Monday Wednesday and Friday, with the goal of her not having any migraines at all.  Depression Her depression is  a chronic issue that she has been working on for years. She states that she feels like she is doing better after her behavioral health visit recently. She has another appointment with behavioral health on 2/13 and a psychiatry appointment in early March. Per the patient, she has a remote diagnosis of bipolar disorder, but it is unclear whether this is an official diagnosis.  She does note that she has both auditory and visual hallucinations and feels like her mood fluctuates dramatically in a cyclical pattern.  I encouraged her to continue to go to her behavioral health visits and her psychiatry evaluation in early March.  We will hold off on any medications until her formal psychiatric evaluation.  Cannabinoid hyperemesis syndrome She has decreased her marijuana intake to 1 blunt daily and has noted a significant improvement in her nausea and vomiting, further suggesting that this was likely related to her marijuana use.  I encouraged her to continue to limit her marijuana use to prevent further nausea and vomiting episodes.  This marijuana use may also be contributing to her hallucinatory experiences, further highlighting the importance of cessation.  Tobacco use She continues to to smoke cigarettes and feels like this has been worsening recently with a decrease in marijuana use.  She is now smoking a pack every few days, which is an increase from previously.  She has noted that she feels a little more short of breath than recently and would like to cut back on this.  She says that the nicotine  patches have not been helping.  I will prescribe her nicotine  gum and see if that helps.  We will revisit Chantix  after her formal psychiatric evaluation.    Dorthy Gavia, MD [facility] Internal Medicine, PGY-1 Phone: [phone] Date 04/01/2023 Time 10:25 AM

## 2023-04-01 NOTE — Progress Notes (Signed)
 Received notice that the patient's insurance is no longer covering Nurtec, though it has been working really well for her.  I spoke with her about propranolol , which she has been on in the past for short periods of time.  She says it did not help her at that time, but feels that she was not on it long enough to tell for sure.  I have sent in a prescription of propranolol  80 mg daily for migraine prophylaxis to her pharmacy and spoke with her about taking this medication regularly for migraine prophylaxis.  If it does not work for her, we will then return to Nurtec.

## 2023-04-01 NOTE — Assessment & Plan Note (Addendum)
 Patient with a history of left-sided migraines almost every day.  Previously tried sumatriptan  with no relief, recently started on Nurtec 75 mg every other day for acute migraine treatment and prophylaxis.  She says this has been going really well and her migraines are much improved from previously.  She has been taking Nurtec every Monday and Wednesday morning and supplementing with Tylenol  as needed for her headaches.  I spoke with her about increasing this to Monday Wednesday and Friday, with the goal of her not having any migraines at all.

## 2023-04-02 ENCOUNTER — Telehealth: Payer: Self-pay

## 2023-04-02 ENCOUNTER — Ambulatory Visit: Payer: Medicaid Other
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
## 2023-04-02 NOTE — Telephone Encounter (Signed)
PT called patient and left message regarding missed appointment. Review attendance policy via voicemail and cancelled all but next remaining visit.   Kristin Wright   04/02/23 4:39 PM

## 2023-04-04 ENCOUNTER — Ambulatory Visit: Payer: Medicaid Other | Admitting: Licensed Clinical Social Worker

## 2023-04-04 ENCOUNTER — Ambulatory Visit: Payer: Medicaid Other | Admitting: Physical Therapy

## 2023-04-04 DIAGNOSIS — F32A Depression, unspecified: Secondary | ICD-10-CM | POA: Diagnosis present
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
## 2023-04-09 ENCOUNTER — Ambulatory Visit: Payer: Medicaid Other | Admitting: Physical Therapy

## 2023-04-10 ENCOUNTER — Other Ambulatory Visit (HOSPITAL_COMMUNITY): Payer: Self-pay

## 2023-04-11 ENCOUNTER — Encounter: Payer: Medicaid Other | Admitting: Physical Therapy
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
## 2023-04-23 ENCOUNTER — Ambulatory Visit (HOSPITAL_BASED_OUTPATIENT_CLINIC_OR_DEPARTMENT_OTHER): Payer: Commercial Managed Care - HMO | Admitting: Psychiatry

## 2023-04-23 VITALS — BP 155/112 | HR 74 | Ht 68.0 in | Wt 324.0 lb

## 2023-04-23 DIAGNOSIS — F3181 Bipolar II disorder: Secondary | ICD-10-CM | POA: Diagnosis not present

## 2023-04-23 DIAGNOSIS — Z79899 Other long term (current) drug therapy: Secondary | ICD-10-CM

## 2023-04-23 DIAGNOSIS — F1290 Cannabis use, unspecified, uncomplicated: Secondary | ICD-10-CM

## 2023-04-23 DIAGNOSIS — F603 Borderline personality disorder: Secondary | ICD-10-CM

## 2023-04-23 MED ORDER — OXCARBAZEPINE 150 MG PO TABS: 150.0000 mg | ORAL_TABLET | Freq: Two times a day (BID) | ORAL | 2 refills | Status: DC

## 2023-04-23 MED ORDER — ARIPIPRAZOLE 5 MG PO TABS: 5.0000 mg | ORAL_TABLET | Freq: Every day | ORAL | 2 refills | Status: DC

## 2023-04-23 NOTE — Telephone Encounter (Signed)
 Hey Dr.Justus I received a Prior Authorization request from the pharmacy. I submitted a Prior Authorization for Nurtec on 03/01/23, attached to this note I listed why the authorization was denied. Please review.

## 2023-04-24 ENCOUNTER — Encounter (HOSPITAL_COMMUNITY): Payer: Self-pay | Admitting: Psychiatry

## 2023-04-24 LAB — VITAMIN D 25 HYDROXY (VIT D DEFICIENCY, FRACTURES): Vit D, 25-Hydroxy: 6.3 ng/mL — ABNORMAL LOW (ref 30.0–100.0)

## 2023-04-24 LAB — LIPID PANEL
Chol/HDL Ratio: 5.1 ratio — ABNORMAL HIGH (ref 0.0–4.4)
Cholesterol, Total: 175 mg/dL (ref 100–199)
HDL: 34 mg/dL — ABNORMAL LOW (ref >39)
LDL Chol Calc (NIH): 130 mg/dL — ABNORMAL HIGH (ref 0–99)
Triglycerides: 54 mg/dL (ref 0–149)
VLDL Cholesterol Cal: 11 mg/dL (ref 5–40)

## 2023-04-24 LAB — TSH: TSH: 2.49 u[IU]/mL (ref 0.450–4.500)

## 2023-04-25 ENCOUNTER — Other Ambulatory Visit (HOSPITAL_COMMUNITY): Payer: Self-pay

## 2023-04-26 ENCOUNTER — Other Ambulatory Visit (HOSPITAL_COMMUNITY): Payer: Self-pay
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
## 2023-05-21 ENCOUNTER — Encounter (HOSPITAL_COMMUNITY): Payer: Self-pay | Admitting: Psychiatry

## 2023-05-21 ENCOUNTER — Other Ambulatory Visit: Payer: Self-pay

## 2023-05-21 ENCOUNTER — Ambulatory Visit (HOSPITAL_BASED_OUTPATIENT_CLINIC_OR_DEPARTMENT_OTHER): Admitting: Psychiatry

## 2023-05-21 VITALS — BP 149/93 | HR 87 | Ht 68.0 in | Wt 322.0 lb

## 2023-05-21 DIAGNOSIS — F3181 Bipolar II disorder: Secondary | ICD-10-CM | POA: Diagnosis not present

## 2023-05-21 DIAGNOSIS — F17200 Nicotine dependence, unspecified, uncomplicated: Secondary | ICD-10-CM | POA: Diagnosis not present

## 2023-05-21 DIAGNOSIS — F603 Borderline personality disorder: Secondary | ICD-10-CM | POA: Diagnosis not present

## 2023-05-21 DIAGNOSIS — F1290 Cannabis use, unspecified, uncomplicated: Secondary | ICD-10-CM

## 2023-05-21 MED ORDER — NICOTINE POLACRILEX 2 MG MT LOZG: 2.0000 mg | LOZENGE | OROMUCOSAL | 2 refills | Status: DC | PRN

## 2023-05-21 MED ORDER — VARENICLINE TARTRATE 1 MG PO TABS: ORAL_TABLET | ORAL | 2 refills | Status: DC

## 2023-05-30 ENCOUNTER — Encounter: Payer: Medicaid Other | Admitting: Student

## 2023-06-04 ENCOUNTER — Ambulatory Visit: Admitting: Student

## 2023-06-04 ENCOUNTER — Other Ambulatory Visit (HOSPITAL_COMMUNITY): Payer: Self-pay

## 2023-06-04 VITALS — BP 135/66 | HR 75 | Temp 98.0°F | Wt 327.3 lb

## 2023-06-04 DIAGNOSIS — E559 Vitamin D deficiency, unspecified: Secondary | ICD-10-CM

## 2023-06-04 DIAGNOSIS — F32A Depression, unspecified: Secondary | ICD-10-CM

## 2023-06-04 DIAGNOSIS — G4733 Obstructive sleep apnea (adult) (pediatric): Secondary | ICD-10-CM

## 2023-06-04 DIAGNOSIS — F12151 Cannabis abuse with psychotic disorder with hallucinations: Secondary | ICD-10-CM

## 2023-06-04 DIAGNOSIS — R112 Nausea with vomiting, unspecified: Secondary | ICD-10-CM | POA: Diagnosis not present

## 2023-06-04 DIAGNOSIS — Z720 Tobacco use: Secondary | ICD-10-CM

## 2023-06-04 DIAGNOSIS — G43E09 Chronic migraine with aura, not intractable, without status migrainosus: Secondary | ICD-10-CM | POA: Diagnosis not present

## 2023-06-04 DIAGNOSIS — Z6841 Body Mass Index (BMI) 40.0 and over, adult: Secondary | ICD-10-CM | POA: Diagnosis not present

## 2023-06-04 DIAGNOSIS — Z3046 Encounter for surveillance of implantable subdermal contraceptive: Secondary | ICD-10-CM

## 2023-06-04 DIAGNOSIS — F1290 Cannabis use, unspecified, uncomplicated: Secondary | ICD-10-CM

## 2023-06-04 DIAGNOSIS — N926 Irregular menstruation, unspecified: Secondary | ICD-10-CM | POA: Diagnosis present

## 2023-06-04 DIAGNOSIS — E785 Hyperlipidemia, unspecified: Secondary | ICD-10-CM

## 2023-06-04 DIAGNOSIS — F419 Anxiety disorder, unspecified: Secondary | ICD-10-CM

## 2023-06-04 LAB — POCT URINE PREGNANCY: Preg Test, Ur: NEGATIVE

## 2023-06-04 MED ORDER — ZEPBOUND 2.5 MG/0.5ML ~~LOC~~ SOAJ
2.5000 mg | SUBCUTANEOUS | 0 refills | Status: DC
  Filled 2023-06-04 – 2023-06-05 (×2): qty 6, 84d supply, fill #0

## 2023-06-04 MED ORDER — TIRZEPATIDE-WEIGHT MANAGEMENT 2.5 MG/0.5ML ~~LOC~~ SOLN
2.5000 mg | SUBCUTANEOUS | 2 refills | Status: DC
  Filled 2023-06-04: qty 6, 84d supply, fill #0

## 2023-06-04 MED ORDER — VITAMIN D (ERGOCALCIFEROL) 1.25 MG (50000 UNIT) PO CAPS
50000.0000 [IU] | ORAL_CAPSULE | ORAL | 0 refills | Status: AC
  Filled 2023-06-04 – 2023-07-11 (×2): qty 6, 42d supply, fill #0

## 2023-06-04 NOTE — Patient Instructions (Signed)
 Thank you, Ms.Caryn Ilah Males for allowing us  to provide your care today. Today we discussed:  For your journey towards weight loss - START Zepbound, inject 2.5 mg in to your skin, once a week. Do it for 4 weeks, then increase it to 5 mg once a week.   For your low VIT D - Start Vit D, one capsule ONLY once a week for 6 weeks   I sent a referral to [place]!  I will find out about Nurtec.   I have ordered the following labs for you:  Lab Orders         POCT Urine Pregnancy      Tests ordered today:  None   Referrals ordered today:   Referral Orders         Ambulatory referral to Integrated Behavioral Health         Ambulatory referral to Obstetrics / Gynecology      I have ordered the following medication/changed the following medications:   Stop the following medications: There are no discontinued medications.   Start the following medications: Meds ordered this encounter  Medications   tirzepatide (ZEPBOUND) 2.5 MG/0.5ML injection vial    Sig: Inject 2.5 mg into the skin once a week.    Dispense:  6 mL    Refill:  2   Vitamin D, Ergocalciferol, (DRISDOL) 1.25 MG (50000 UNIT) CAPS capsule    Sig: Take 1 capsule (50,000 Units total) by mouth every 7 (seven) days. TAKE IT ONLY ONCE A WEEK    Dispense:  6 capsule    Refill:  0     Follow up:  6 weeks weight management and Vit D      Remember:   Should you have any questions or concerns please call the internal medicine clinic at [phone].     Lanney Pitts, DO [facility]

## 2023-06-04 NOTE — Progress Notes (Unsigned)
 Established Patient Office Visit  Subjective   Patient ID: Kristin Wright, female    DOB: 12-03-1985  Age: 38 y.o. MRN: [id number]  No chief complaint on file.   HPI  This is a 38 year old female living with a history stated below and presents today for follow-up on migraines, depression, cannabinoid hyperemesis syndrome, tobacco use. Please see problem based assessment and plan for additional details.    Past Medical History:  Diagnosis Date   Anxiety    Cannabinoid hyperemesis syndrome 02/01/2023   Fainting 2004   triggered by bright lights   ROS   As per assessment and plan  Objective:     There were no vitals taken for this visit. BP Readings from Last 3 Encounters:  06/04/23 135/66  04/01/23 121/80  02/28/23 121/88   Wt Readings from Last 3 Encounters:  06/04/23 (!) 327 lb 4.8 oz (148.5 kg)  04/01/23 (!) 325 lb 11.2 oz (147.7 kg)  02/28/23 (!) 331 lb 4.8 oz (150.3 kg)   SpO2 Readings from Last 3 Encounters:  06/04/23 99%  04/01/23 100%  02/28/23 95%      Physical Exam   No results found for any visits on 06/04/23.  Last CBC Lab Results  Component Value Date   WBC 8.7 12/27/2022   HGB 13.6 12/27/2022   HCT 41.2 12/27/2022   MCV 99.0 12/27/2022   MCH 32.7 12/27/2022   RDW 13.0 12/27/2022   PLT 416 (H) 12/27/2022   Last metabolic panel Lab Results  Component Value Date   GLUCOSE 81 12/27/2022   NA 140 12/27/2022   K 3.8 12/27/2022   CL 104 12/27/2022   CO2 28 12/27/2022   BUN 8 12/27/2022   CREATININE 0.88 12/27/2022   GFRNONAA >60 12/27/2022   CALCIUM 9.8 12/27/2022   PROT 7.4 11/15/2021   ALBUMIN 4.2 11/15/2021   LABGLOB 2.9 07/03/2017   AGRATIO 1.4 07/03/2017   BILITOT 0.4 11/15/2021   ALKPHOS 54 11/15/2021   AST 15 11/15/2021   ALT 14 11/15/2021   ANIONGAP 8 12/27/2022   Last lipids Lab Results  Component Value Date   CHOL 175 04/23/2023   HDL 34 (L) 04/23/2023   LDLCALC 130 (H) 04/23/2023   TRIG 54 04/23/2023    CHOLHDL 5.1 (H) 04/23/2023   Last thyroid functions Lab Results  Component Value Date   TSH 2.490 04/23/2023      The ASCVD Risk score (Arnett DK, et al., 2019) failed to calculate for the following reasons:   The 2019 ASCVD risk score is only valid for ages 54 to 64    Assessment & Plan:  MACHEN   Nexplanon  07/03/2017, Dr Gigi Gin. LMP 03/28-04/03  Usually gets them aorund  Test March 25th, positive  20-21 Tested 22 - negative   Symptoms nausea, increase in urine frequency at night, 3-4 days diarrhea, loose stools, 3 today, + night sweats  no abdominal pain No sick contact  No dysuria, no discharge.  - negative   Migranes 2 migranes episodes daily, tolerable. Controlled, not affecting day to day life.   Takes Sumatriptan, takes it twice a week, helps. Dispensed 03/05/2023 for 9 tablets,  - Nurtec, pending auth ? - camille   Depression  Borderline  - Follows IBH - Continue Trileptal 150 mg BID - Continue Abilify 5 mg Daily -Chantix 0.5 mg daily for a week before increasing to 0.5 mg twice a day for a week, and finally 1 mg twice a day -nicotine lozenges  2 mg every hour as needed for nicotine cravings  - 10/8 (PHQ 9 stuff   Hyperlipidmeia LDL 130,  ASCVD= 4.2 % - Lifestyle    Vit D  Vit D 6.3  - supplemntation    25,000 to 50,000 international units (625 to 1250 micrograms) of vitamin D2 or D3 orally once per week for six to eight weeks, and then 800 international units (20 micrograms) of vitamin D2 or D3 daily thereafter.    Weight loss Morbid obesity 49.77 OSA  - Zepbound 2.5 mg     Problem List Items Addressed This Visit   None   No follow-ups on file.    Lanney Pitts, DO

## 2023-06-05 ENCOUNTER — Other Ambulatory Visit (HOSPITAL_COMMUNITY): Payer: Self-pay

## 2023-06-05 ENCOUNTER — Other Ambulatory Visit: Payer: Self-pay

## 2023-06-05 ENCOUNTER — Telehealth: Payer: Self-pay

## 2023-06-05 DIAGNOSIS — E559 Vitamin D deficiency, unspecified: Secondary | ICD-10-CM | POA: Insufficient documentation

## 2023-06-05 MED ORDER — ZEPBOUND 2.5 MG/0.5ML ~~LOC~~ SOAJ: 2.5000 mg | SUBCUTANEOUS | 0 refills | Status: DC

## 2023-06-05 NOTE — Telephone Encounter (Signed)
 Prior Authorization for patient (Zepbound 2.5MG /0.5ML pen-injectors) came through on cover my meds was submitted with last office notes awaiting approval or denial.   KEY: [id number]

## 2023-06-05 NOTE — Telephone Encounter (Signed)
 YOU ASKED FOR: Service Description Code 1 Code 2 Plan Requested  Dates Requested  Amount Kristin Wright Auto-inj [id number] Medicaid 06/05/2023  to  12/05/2023 6 units WE DENIED: Service Description Code 1 Code 2 Plan Denied Dates Denied  Amount ZEPBOUND Soln Auto-inj [id number] Medicaid 06/05/2023  to  12/05/2023 6 units COMMENTS:  The following criteria from the health plan guideline, GLP1s for Weight Management, must be met before  we can approve this request. Please send us  supporting chart notes and lab results. 1. The preferred drug [facility]), if applicable, which treats the PA indication, is required unless the patient  meets the non-preferred PDL PA criteria, including completion of an adequate titration period of 3 to 6  months of the preferred drug. (Failure of the preferred drug is considered to be a drug trial and failure of 3  to 6 months to complete dose titration and determine the side effect profile for the member, unless there is  a documented contraindication to the preferred drug. Titration can take up to 6 months for GLP1s.); AND 2. The beneficiary is currently on and will continue lifestyle modification including structured nutrition and  physical activity, unless physical activity is not clinically appropriate at the time GLP1 therapy commences

## 2023-06-05 NOTE — Assessment & Plan Note (Signed)
 Patient follows integrated behavioral health, last visit on 4/14; reports that she takes Trileptal 150 mg twice daily and Abilify 5 mg daily.  In office, PHQ-9 score of 10 and GAD score of 8.  Reports that her mood is stable.  -Continue following up with integrated behavioral health -Referral to be on-call placed

## 2023-06-05 NOTE — Assessment & Plan Note (Signed)
 Patient reports that she gets 2 migraine episodes daily however they are tolerable and controlled.  It is not affecting her day-to-day life.  Reports that she takes sumatriptan as needed; last dispensed on 03/05/2023 for 9 tablets.  Patient reports that per the last office visit, they did attempt for Nurtec however pending authorization. -Will reach out to [place] for [employer]	[place] assistance

## 2023-06-05 NOTE — Assessment & Plan Note (Signed)
 She followed behavioral health, they prescribed her Chantix and nicotine lozenges; patient reports that she has not picked it up from the pharmacy yet, plans to go ahead and pick it up.  Otherwise no concerns today. -Tobacco counseling provided

## 2023-06-05 NOTE — Assessment & Plan Note (Addendum)
 Has OSA, on CPAP at night. We discussed how Zepbound is now approved for moderate to severe OSA in adults with obesity and she is interested in trying this. Goal is to improve her apnea hypopnea index (AHI).  Denies any history of thyroid cancers, pancreatitis, cholecystitis. - START Zepbound 2.5 mg weekly for four weeks, then titrate it to 2 mg  - Follow up in 6 weeks  - Counseled on GI side effects

## 2023-06-05 NOTE — Assessment & Plan Note (Signed)
 Lab Results  Component Value Date   CHOL 175 04/23/2023   HDL 34 (L) 04/23/2023   LDLCALC 130 (H) 04/23/2023   TRIG 54 04/23/2023   CHOLHDL 5.1 (H) 04/23/2023    ASCVD risk score of 4.2%, LDL 130, denies any previous history of stroke, MI, PAD. -Lifestyle modifications

## 2023-06-05 NOTE — Assessment & Plan Note (Addendum)
 BMI of 49.77; has tried life style interventions but unable to loose weight. She tries to be active, but notes that she can improve in this area.   Plan: - Advised on continued lifestyle modifications - Advised on exercise programs in the community

## 2023-06-05 NOTE — Addendum Note (Signed)
 Addended by: Lanney Pitts on: 06/05/2023 11:49 AM   Modules accepted: Orders

## 2023-06-05 NOTE — Assessment & Plan Note (Signed)
 Patient's vitamin D level of 6.3, severely low. -Take vitamin D 50,000 units 1 capsule every 7 days for 6 weeks -Recheck vitamin D levels at the next office visit

## 2023-06-05 NOTE — Assessment & Plan Note (Signed)
 Patient presents he reports that she wants to get a pregnancy test.  States that she usually gets her menstrual period on the 20th or 21st of every single month, reports that last month March she did not have her periods on 20th or 21; she tested on the 22nd, which was negative.  She tested again on March 25, which was positive.  Reports that she had her menstrual period on March 28 till April 3.  States that she had the Nexplanon placed on Jul 03, 2017 by Dr. Carles Cheadle, and would like to get it removed.  She is endorsing symptoms of nausea, loose stools for the past 3 to 4 days, no abdominal pain.  No sick contacts.  No dysuria no discharge.  Likely viral.  - Urine pregnancy test negative -Referral to OB/GYN placed for removal of the contraceptive and replacement

## 2023-06-06 ENCOUNTER — Other Ambulatory Visit: Payer: Self-pay | Admitting: Student

## 2023-06-06 DIAGNOSIS — G4733 Obstructive sleep apnea (adult) (pediatric): Secondary | ICD-10-CM

## 2023-06-06 MED ORDER — ZEPBOUND 2.5 MG/0.5ML ~~LOC~~ SOAJ: 2.5000 mg | SUBCUTANEOUS | 0 refills | Status: DC

## 2023-06-06 NOTE — Telephone Encounter (Addendum)
 Prior Authorization has been resubmitted with the diagnosis of morbid obesity.   KEY: [id number]      **Update 06/06/23 @1 :43pm** Kristin Wright (Key: [id number]) Zepbound 2.5MG /0.5ML pen-injectors Form [employer] of [place]  Electronic Prior Authorization Request Form (2017 NCPDP) Sent to Plan Plan Response Submit Clinical Questions Determination Unfavorable Message from Plan Denied. The following criteria from the health plan guideline, GLP1s for Weight Management, must be met before we can approve this request. Please send us  supporting chart notes and lab results. 1. The beneficiary does NOT have any FDA-labeled contraindications to the requested agent, including pregnancy, lactation, history of medullary thyroid cancer or multiple endocrine neoplasia type II.

## 2023-06-06 NOTE — Telephone Encounter (Signed)
 The prior authorization was previously submitted with the diagnosis of OSA, her insurance denied it. The patients insurance considers Zepbound as a GLP-1 / weight management medication.

## 2023-06-10 ENCOUNTER — Other Ambulatory Visit (HOSPITAL_COMMUNITY): Payer: Self-pay | Admitting: Psychiatry

## 2023-06-10 DIAGNOSIS — F17200 Nicotine dependence, unspecified, uncomplicated: Secondary | ICD-10-CM

## 2023-06-11 ENCOUNTER — Other Ambulatory Visit (HOSPITAL_COMMUNITY): Payer: Self-pay

## 2023-06-13 ENCOUNTER — Other Ambulatory Visit (HOSPITAL_COMMUNITY): Payer: Self-pay

## 2023-06-13 MED ORDER — ZEPBOUND 2.5 MG/0.5ML ~~LOC~~ SOAJ
2.5000 mg | SUBCUTANEOUS | 0 refills | Status: DC
Start: 2023-06-06 — End: 2023-08-23
  Filled 2023-07-11: qty 2, 28d supply, fill #0
  Filled 2023-07-16: qty 6, 84d supply, fill #0
  Filled 2023-07-16: qty 2, 28d supply, fill #0

## 2023-06-14 ENCOUNTER — Other Ambulatory Visit (HOSPITAL_COMMUNITY): Payer: Self-pay

## 2023-06-17 ENCOUNTER — Other Ambulatory Visit (HOSPITAL_COMMUNITY): Payer: Self-pay
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
## 2023-06-18 ENCOUNTER — Other Ambulatory Visit: Payer: Self-pay

## 2023-06-18 ENCOUNTER — Ambulatory Visit

## 2023-06-18 ENCOUNTER — Encounter (HOSPITAL_COMMUNITY): Payer: Self-pay | Admitting: Psychiatry

## 2023-06-18 ENCOUNTER — Ambulatory Visit (HOSPITAL_BASED_OUTPATIENT_CLINIC_OR_DEPARTMENT_OTHER): Admitting: Psychiatry

## 2023-06-18 ENCOUNTER — Other Ambulatory Visit (HOSPITAL_COMMUNITY): Payer: Self-pay

## 2023-06-18 DIAGNOSIS — F603 Borderline personality disorder: Secondary | ICD-10-CM

## 2023-06-18 DIAGNOSIS — F17200 Nicotine dependence, unspecified, uncomplicated: Secondary | ICD-10-CM

## 2023-06-18 DIAGNOSIS — F3181 Bipolar II disorder: Secondary | ICD-10-CM

## 2023-06-18 MED ORDER — OXCARBAZEPINE 150 MG PO TABS
150.0000 mg | ORAL_TABLET | Freq: Two times a day (BID) | ORAL | 2 refills | Status: AC
  Filled 2023-06-18 – 2023-07-11 (×2): qty 60, 30d supply, fill #0

## 2023-06-18 MED ORDER — NICOTINE POLACRILEX 4 MG MT LOZG
4.0000 mg | LOZENGE | OROMUCOSAL | 0 refills | Status: DC | PRN
  Filled 2023-06-18: qty 72, 30d supply, fill #0
  Filled 2023-07-11: qty 72, 15d supply, fill #0

## 2023-06-18 MED ORDER — NICOTINE POLACRILEX 2 MG MT LOZG
2.0000 mg | LOZENGE | OROMUCOSAL | 2 refills | Status: DC | PRN
  Filled 2023-06-18: qty 72, 30d supply, fill #0
  Filled 2023-07-11: qty 72, 15d supply, fill #0

## 2023-06-18 MED ORDER — VARENICLINE TARTRATE 1 MG PO TABS
ORAL_TABLET | ORAL | 2 refills | Status: AC
  Filled 2023-06-18: qty 39, 28d supply, fill #0

## 2023-06-18 MED ORDER — ARIPIPRAZOLE 5 MG PO TABS
5.0000 mg | ORAL_TABLET | Freq: Every day | ORAL | 2 refills | Status: AC
  Filled 2023-06-18 – 2023-07-11 (×2): qty 30, 30d supply, fill #0

## 2023-06-19 NOTE — Progress Notes (Signed)
 Internal Medicine Clinic Attending  Case discussed with the resident at the time of the visit.  We reviewed the resident's history and exam and pertinent patient test results.  I agree with the assessment, diagnosis, and plan of care documented in the resident's note.

## 2023-06-28 ENCOUNTER — Other Ambulatory Visit (HOSPITAL_COMMUNITY): Payer: Self-pay

## 2023-07-11 ENCOUNTER — Other Ambulatory Visit: Payer: Self-pay

## 2023-07-11 ENCOUNTER — Institutional Professional Consult (permissible substitution): Admitting: Licensed Clinical Social Worker

## 2023-07-11 ENCOUNTER — Other Ambulatory Visit (HOSPITAL_COMMUNITY): Payer: Self-pay

## 2023-07-16 ENCOUNTER — Ambulatory Visit (INDEPENDENT_AMBULATORY_CARE_PROVIDER_SITE_OTHER): Admitting: Student

## 2023-07-16 ENCOUNTER — Telehealth: Payer: Self-pay

## 2023-07-16 ENCOUNTER — Other Ambulatory Visit (HOSPITAL_COMMUNITY): Payer: Self-pay

## 2023-07-16 ENCOUNTER — Encounter: Payer: Self-pay | Admitting: Student

## 2023-07-16 VITALS — BP 135/87 | HR 81 | Temp 98.2°F | Ht 67.0 in | Wt 326.0 lb

## 2023-07-16 DIAGNOSIS — F419 Anxiety disorder, unspecified: Secondary | ICD-10-CM

## 2023-07-16 DIAGNOSIS — Z720 Tobacco use: Secondary | ICD-10-CM

## 2023-07-16 DIAGNOSIS — F1290 Cannabis use, unspecified, uncomplicated: Secondary | ICD-10-CM

## 2023-07-16 DIAGNOSIS — Z6841 Body Mass Index (BMI) 40.0 and over, adult: Secondary | ICD-10-CM | POA: Diagnosis not present

## 2023-07-16 DIAGNOSIS — F17210 Nicotine dependence, cigarettes, uncomplicated: Secondary | ICD-10-CM | POA: Diagnosis not present

## 2023-07-16 DIAGNOSIS — R1116 Cannabis hyperemesis syndrome: Secondary | ICD-10-CM

## 2023-07-16 DIAGNOSIS — F32A Depression, unspecified: Secondary | ICD-10-CM

## 2023-07-16 MED ORDER — NICOTINE POLACRILEX 4 MG MT GUM
4.0000 mg | CHEWING_GUM | OROMUCOSAL | 0 refills | Status: DC | PRN
  Filled 2023-07-16: qty 100, 15d supply, fill #0
  Filled 2023-07-16: qty 110, 10d supply, fill #0

## 2023-07-16 NOTE — Patient Instructions (Signed)
 Thank you, [name] for allowing us  to provide your care today.   -Start Zepbound   -You need at most 2,600 calories for someone your weight and height. Please do not go over this amount. Attempt to go 200 calories below this. Make wise changes to your eating - eat leaner meats, try to subsitute carbs for healthier vegetables such as rice -> coliflower, whole milk -> skim milk.  Exercise, exercise, exercise -> strength training is best   Follow up: 1 month   Should you have any questions or concerns please call the internal medicine clinic at [phone]    Jose Ngo, MD [facility]

## 2023-07-16 NOTE — Telephone Encounter (Signed)
 Kristin Wright (Kristin Wright) Rx #: [id number] Zepbound  2.5MG /0.5ML pen-injectors Form [employer] of [place]  Electronic Prior Authorization Request Form (2017 NCPDP) Created Sent to Plan Plan Response Submit Clinical Questions Determination Favorable Message from Plan Approved. This drug has been approved. Approved quantity: 6 milliliters per 30 day(s). You may fill up to a 34 day supply at a retail pharmacy. You may fill up to a 90 day supply for maintenance drugs, please refer to the formulary for details. Please call the pharmacy to process your prescription claim.. Authorization Expiration Date: January 12, 2024.

## 2023-07-16 NOTE — Assessment & Plan Note (Signed)
 Patient is morbidly obese and recently was prescribed Zepbound  given that she has OSA.  She is not a big eater when she eats its usually at midnight but her meals usually consist primarily of junk food and fried food.  She is starting to bake but then sometimes she will order door Dash and then she would resort to her prior habits because of craving and convenience.  She walks at work and as she works at a community of mental health patients but does not do any other exercise.  She recently received the approval for Zepbound  for weight loss and is to start this medication.  I have counseled her that it is abound alone may help with her weight loss but she will need to also undergo some lifestyle modification given her history of hyperlipidemia and also to prevent rebound weight gain after she is off Zepbound .  She does have a history of anxiety and bipolar disorder and she does follow with psychiatry as well as [facility].  I have reviewed her caloric requirements and she needs about 3600 cal a day for her weight and height.  She would benefit from making healthier meal choices and snacks.  I have reviewed some of these with her and she will try to eat more vegetables and have less fried foods.  She will opt for less nutritionally dense foods as well.  I do think she will benefit from talking to [facility] for weight loss and motivation interviewing so that she can make healthier choices and continue losing weight.  I have also counseled her on decreasing the amount of marijuana that she smokes.  She smokes about 3 joints a day and has been doing this since she was 38 years old.  She denies any munchies but anticipates she does not see the difference since she has been 12.  She does feel that the marijuana and the Gummies helped her with her anxiety.  However I do think that this may be contributing to some of her weight.  She is also trying to quit smoking and she started Chantix  she does not like the close and just for  craving I am prescribing her some Nicorette  gum.  Decreasing smoking will likely exacerbate her anxiety but she would benefit from continued counseling and following with psychiatry.  -Start Zepbound  -Lifestyle modification for healthier choices  -Increase exercise- strength training preferred -Decreasing amount of cannabis use  -Referral to integrative behavioral health  -Continue following with psych

## 2023-07-16 NOTE — Progress Notes (Signed)
 CC:  Chief Complaint  Patient presents with   Follow-up   HPI:  [name] is a 38 y.o. female living with a history stated below and presents today for the above. Please see problem based assessment and plan for additional details.  Past Medical History:  Diagnosis Date   Anxiety    Cannabinoid hyperemesis syndrome 02/01/2023   Fainting 2004   triggered by bright lights    Current Outpatient Medications on File Prior to Visit  Medication Sig Dispense Refill   ARIPiprazole  (ABILIFY ) 5 MG tablet Take 1 tablet (5 mg total) by mouth daily. 30 tablet 2   OXcarbazepine  (TRILEPTAL ) 150 MG tablet Take 1 tablet (150 mg total) by mouth 2 (two) times daily. 60 tablet 2   Rimegepant Sulfate  (NURTEC) 75 MG TBDP Take 1 tablet (75 mg total) by mouth every other day. (Patient not taking: Reported on 06/18/2023) 16 tablet 1   tirzepatide  (ZEPBOUND ) 2.5 MG/0.5ML Pen Inject 2.5 mg into the skin once a week. (Patient not taking: Reported on 06/18/2023) 6 mL 0   tirzepatide  (ZEPBOUND ) 2.5 MG/0.5ML Pen Inject 2.5 mg into the skin once a week. (Patient not taking: Reported on 06/18/2023) 6 mL 0   varenicline  (CHANTIX  CONTINUING MONTH PAK) 1 MG tablet Take 0.5 tablets (0.5 mg total) by mouth daily for 7 days, THEN 0.5 tablets (0.5 mg total) 2 (two) times daily for 7 days, THEN 1 tablet (1 mg total) 2 (two) times daily for 14 days. 39 tablet 2   Vitamin D , Ergocalciferol , (DRISDOL ) 1.25 MG (50000 UNIT) CAPS capsule Take 1 capsule (50,000 Units total) by mouth every 7 (seven) days. TAKE IT ONLY ONCE A WEEK 6 capsule 0   No current facility-administered medications on file prior to visit.    Family History  Problem Relation Age of Onset   Hypertension Mother    Diabetes Mother    Hyperlipidemia Mother    Stroke Mother    Other Mother    Heart disease Mother    Asthma Father    Heart disease Maternal Grandmother    Breast cancer Maternal Grandmother    Cervical cancer Maternal Grandmother     Hypertension Maternal Grandmother    Diabetes Maternal Grandmother    Diabetes Maternal Grandfather    Heart disease Maternal Grandfather    Hyperlipidemia Maternal Grandfather     Social History   Socioeconomic History   Marital status: Single    Spouse name: Not on file   Number of children: Not on file   Years of education: Not on file   Highest education level: Not on file  Occupational History   Not on file  Tobacco Use   Smoking status: Every Day    Current packs/day: 0.25    Average packs/day: 0.3 packs/day for 20.0 years (5.0 ttl pk-yrs)    Types: Cigarettes   Smokeless tobacco: Never   Tobacco comments:    5 cig a day  Vaping Use   Vaping status: Never Used  Substance and Sexual Activity   Alcohol use: No   Drug use: Yes    Frequency: 7.0 times per week    Types: Marijuana   Sexual activity: Not Currently    Birth control/protection: Implant  Other Topics Concern   Not on file  Social History Narrative   Right handed    Caffeine  1 daily   Lives in one story home live in client    Social Drivers of Health   Financial Resource Strain:  Low Risk  (08/17/2021)   Overall Financial Resource Strain (CARDIA)    Difficulty of Paying Living Expenses: Not hard at all  Food Insecurity: No Food Insecurity (08/17/2021)   Hunger Vital Sign    Worried About Running Out of Food in the Last Year: Never true    Ran Out of Food in the Last Year: Never true  Transportation Needs: No Transportation Needs (08/17/2021)   PRAPARE - Administrator, Civil Service (Medical): No    Lack of Transportation (Non-Medical): No  Physical Activity: Inactive (08/17/2021)   Exercise Vital Sign    Days of Exercise per Week: 0 days    Minutes of Exercise per Session: 0 min  Stress: Not on file  Social Connections: Not on file  Intimate Partner Violence: Not on file    Review of Systems: ROS negative except for what is noted on the assessment and plan.  Vitals:   07/16/23  1404  BP: 135/87  Pulse: 81  Temp: 98.2 F (36.8 C)  TempSrc: Oral  SpO2: 98%  Weight: (!) 326 lb (147.9 kg)  Height: 5\' 7"  (1.702 m)    Physical Exam: Constitutional: well-appearing, morbidly obese, in NAD HENT: normocephalic atraumatic, mucous membranes moist Eyes: conjunctiva non-erythematous Cardiovascular: regular rate and rhythm, no m/r/g Pulmonary/Chest: normal work of breathing on room air, lungs clear to auscultation bilaterally Abdominal: soft, non-tender, non-distended MSK: normal bulk and tone Neurological: alert & oriented x 3, no focal deficit Skin: warm and dry Psych: normal mood and behavior  Assessment & Plan:   Patient discussed Dr. Bettejane Brownie  Morbid obesity with BMI of 50.0-59.9, adult [facility]) Patient is morbidly obese and recently was prescribed Zepbound  given that she has OSA.  She is not a big eater when she eats its usually at midnight but her meals usually consist primarily of junk food and fried food.  She is starting to bake but then sometimes she will order door Dash and then she would resort to her prior habits because of craving and convenience.  She walks at work and as she works at a community of mental health patients but does not do any other exercise.  She recently received the approval for Zepbound  for weight loss and is to start this medication.  I have counseled her that it is abound alone may help with her weight loss but she will need to also undergo some lifestyle modification given her history of hyperlipidemia and also to prevent rebound weight gain after she is off Zepbound .  She does have a history of anxiety and bipolar disorder and she does follow with psychiatry as well as [facility].  I have reviewed her caloric requirements and she needs about 3600 cal a day for her weight and height.  She would benefit from making healthier meal choices and snacks.  I have reviewed some of these with her and she will try to eat more vegetables and have less  fried foods.  She will opt for less nutritionally dense foods as well.  I do think she will benefit from talking to [facility] for weight loss and motivation interviewing so that she can make healthier choices and continue losing weight.  I have also counseled her on decreasing the amount of marijuana that she smokes.  She smokes about 3 joints a day and has been doing this since she was 38 years old.  She denies any munchies but anticipates she does not see the difference since she has been 12.  She does feel that the marijuana and the Gummies helped her with her anxiety.  However I do think that this may be contributing to some of her weight.  She is also trying to quit smoking and she started Chantix  she does not like the close and just for craving I am prescribing her some Nicorette  gum.  Decreasing smoking will likely exacerbate her anxiety but she would benefit from continued counseling and following with psychiatry.  -Start Zepbound  -Lifestyle modification for healthier choices  -Increase exercise- strength training preferred -Decreasing amount of cannabis use  -Referral to integrative behavioral health  -Continue following with psych   Jose Ngo, MD [facility] Internal Medicine, PGY-1 Phone: [phone] Date 07/16/2023 Time 5:39 PM

## 2023-07-16 NOTE — Telephone Encounter (Signed)
 Prior Authorization for patient (Zepbound  2.5MG /0.5ML pen-injectors) came through on cover my meds was submitted with last office notes awaiting approval or denial.  ZOX:[id number]

## 2023-07-17 NOTE — Progress Notes (Signed)
 Internal Medicine Clinic Attending  Case discussed with the resident at the time of the visit.  We reviewed the resident's history and exam and pertinent patient test results.  I agree with the assessment, diagnosis, and plan of care documented in the resident's note.

## 2023-07-18 ENCOUNTER — Other Ambulatory Visit (HOSPITAL_COMMUNITY): Payer: Self-pay

## 2023-07-18 ENCOUNTER — Telehealth: Payer: Self-pay | Admitting: Student

## 2023-07-18 NOTE — Telephone Encounter (Signed)
 Pt requesting a call back. Py states she called the [facility] pharmacy and her Medication has been approved for the following medication but she can not pay $1,163.25 for it.  Zepbound  2.5MG /0.5ML pen-injectors   tirzepatide  (ZEPBOUND ) 2.5 MG/0.5ML Pen 6 mL 0 06/06/2023 --   Sig - Route: Inject 2.5 mg into the skin once a week. - Subcutaneous   Patient not taking: Reported on 06/18/2023   Sent to pharmacy as: tirzepatide  (ZEPBOUND ) 2.5 MG/0.5ML Pen   E-Prescribing Status: Receipt confirmed by pharmacy (06/06/2023  8:45 AM EDT)

## 2023-07-18 NOTE — Telephone Encounter (Signed)
 I called the [facility] and spoke to [place]. Kristin Wright was able to run the PA through their system. The patients co pay will be $4. I called and spoke to the patient she is aware of the $4 co pay and will go pick up the rx.

## 2023-07-22 ENCOUNTER — Institutional Professional Consult (permissible substitution): Admitting: Licensed Clinical Social Worker

## 2023-07-25 ENCOUNTER — Encounter: Payer: Self-pay | Admitting: *Deleted

## 2023-07-29 ENCOUNTER — Institutional Professional Consult (permissible substitution): Admitting: Licensed Clinical Social Worker
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
## 2023-07-30 ENCOUNTER — Encounter (HOSPITAL_COMMUNITY): Payer: Self-pay

## 2023-07-30 ENCOUNTER — Ambulatory Visit (HOSPITAL_COMMUNITY): Admitting: Psychiatry

## 2023-07-30 ENCOUNTER — Institutional Professional Consult (permissible substitution): Admitting: Licensed Clinical Social Worker

## 2023-08-16 ENCOUNTER — Other Ambulatory Visit (HOSPITAL_COMMUNITY): Payer: Self-pay

## 2023-08-16 ENCOUNTER — Encounter: Payer: Self-pay | Admitting: Student

## 2023-08-16 ENCOUNTER — Ambulatory Visit (INDEPENDENT_AMBULATORY_CARE_PROVIDER_SITE_OTHER): Admitting: Student

## 2023-08-16 VITALS — BP 122/87 | HR 95 | Temp 97.8°F | Ht 67.0 in | Wt 319.4 lb

## 2023-08-16 DIAGNOSIS — Z975 Presence of (intrauterine) contraceptive device: Secondary | ICD-10-CM

## 2023-08-16 DIAGNOSIS — Z6841 Body Mass Index (BMI) 40.0 and over, adult: Secondary | ICD-10-CM

## 2023-08-16 DIAGNOSIS — G4733 Obstructive sleep apnea (adult) (pediatric): Secondary | ICD-10-CM | POA: Diagnosis not present

## 2023-08-16 DIAGNOSIS — Z3046 Encounter for surveillance of implantable subdermal contraceptive: Secondary | ICD-10-CM | POA: Diagnosis not present

## 2023-08-16 DIAGNOSIS — E559 Vitamin D deficiency, unspecified: Secondary | ICD-10-CM | POA: Diagnosis present

## 2023-08-16 DIAGNOSIS — E860 Dehydration: Secondary | ICD-10-CM

## 2023-08-16 DIAGNOSIS — Z114 Encounter for screening for human immunodeficiency virus [HIV]: Secondary | ICD-10-CM

## 2023-08-16 DIAGNOSIS — Z1159 Encounter for screening for other viral diseases: Secondary | ICD-10-CM

## 2023-08-16 DIAGNOSIS — Z0000 Encounter for general adult medical examination without abnormal findings: Secondary | ICD-10-CM

## 2023-08-16 MED ORDER — TIRZEPATIDE-WEIGHT MANAGEMENT 5 MG/0.5ML ~~LOC~~ SOAJ
5.0000 mg | SUBCUTANEOUS | 2 refills | Status: DC
  Filled 2023-08-16: qty 2, 28d supply, fill #0

## 2023-08-16 NOTE — Progress Notes (Signed)
 Internal Medicine Clinic Attending  Case discussed with the resident at the time of the visit.  We reviewed the resident's history and exam and pertinent patient test results.  I agree with the assessment, diagnosis, and plan of care documented in the resident's note.

## 2023-08-16 NOTE — Assessment & Plan Note (Addendum)
 BMI 50.03. Weight has decreased 326 lbs to 319 lbs. Taking Zepbound  2.5 mg weekly for at least 4 weeks. Tolerating well and reports adherence. Has some nausea within 48 hours of injection but resolves. At times feels dehydrated but doing best to hydrate. Last A1c was 5.5 in 02/2023. Will titrate up today.   Plan -Increase Zepbound  to 5 mg weekly, continue titration up as tolerated every 4 weeks (pt can message about new Rx) -Plan f/u OV in 2-3 months

## 2023-08-16 NOTE — Assessment & Plan Note (Signed)
 Last vitamin D  low at 6.3, was supplemented with 50,000 units weekly x 6 weeks. She has completed it. Will recheck vitamin D  level today, may benefit from daily supplementation.

## 2023-08-16 NOTE — Assessment & Plan Note (Deleted)
 See Obesity A&P above.

## 2023-08-16 NOTE — Assessment & Plan Note (Signed)
-  Agreed for HIV and HCV screenings today. No acute concerns.

## 2023-08-16 NOTE — Assessment & Plan Note (Addendum)
 Hx of Nexplanon  implanted on 06/2017 by Dr. Winton (OBGYN). Referral resent to OBGYN for removal. States she has not followed up and needs to re-establish.

## 2023-08-16 NOTE — Patient Instructions (Addendum)
 Thank you, Kristin Wright for allowing us  to provide your care today. Today we discussed:  -Glad weight is improving. If tolerating Zepbound  2.5 mg, new prescription sent for 5.0 mg weekly. Can continue increasing every 4 weeks as long as you are not having severe side effects.  -Blood work today to check electrolytes, kidneys, vitamin D , and screenings - I will call or message with results -Referral to OBGYN regarding your Nexplanon  and due for pap smear.  [facility] [address] [place],  [place]  [address] Main: [phone]  I have ordered the following medication/changed the following medications:  Start the following medications: Meds ordered this encounter  Medications   tirzepatide  (ZEPBOUND ) 5 MG/0.5ML Pen    Sig: Inject 5 mg into the skin once a week.    Dispense:  2 mL    Refill:  2     Follow up: 2-3 months   Should you have any questions or concerns please call the internal medicine clinic at [phone].    Kristin Wright, D.O. [facility]

## 2023-08-16 NOTE — Progress Notes (Signed)
 CC: Zepbound   HPI:  [name] is a 38 y.o. female living with a history stated below and presents today for Zepbound . Please see problem based assessment and plan for additional details.  Medications Migraine: Nurtec 75 mg every other day  Bipolar 2, BPD, PTSD: Oxcarbazepine  150 BID, Abilify  5 mg daily  Tobacco use: nicotine  gum  Vitamin D  deficiency: D 50,000 weekly x 6 weeks (started 5/22) Obesity/OSA: Zepbound  2.5 mg weekly  Past Medical History:  Diagnosis Date   Anxiety    Cannabinoid hyperemesis syndrome 02/01/2023   Fainting 2004   triggered by bright lights    Current Outpatient Medications on File Prior to Visit  Medication Sig Dispense Refill   ARIPiprazole  (ABILIFY ) 5 MG tablet Take 1 tablet (5 mg total) by mouth daily. 30 tablet 2   nicotine  polacrilex (NICORETTE ) 4 MG gum Take 1 each (4 mg total) by mouth as needed for smoking cessation. 100 tablet 0   OXcarbazepine  (TRILEPTAL ) 150 MG tablet Take 1 tablet (150 mg total) by mouth 2 (two) times daily. 60 tablet 2   Rimegepant Sulfate  (NURTEC) 75 MG TBDP Take 1 tablet (75 mg total) by mouth every other day. (Patient not taking: Reported on 06/18/2023) 16 tablet 1   tirzepatide  (ZEPBOUND ) 2.5 MG/0.5ML Pen Inject 2.5 mg into the skin once a week. (Patient not taking: Reported on 06/18/2023) 6 mL 0   Vitamin D , Ergocalciferol , (DRISDOL ) 1.25 MG (50000 UNIT) CAPS capsule Take 1 capsule (50,000 Units total) by mouth every 7 (seven) days. TAKE IT ONLY ONCE A WEEK 6 capsule 0   No current facility-administered medications on file prior to visit.    Family History  Problem Relation Age of Onset   Hypertension Mother    Diabetes Mother    Hyperlipidemia Mother    Stroke Mother    Other Mother    Heart disease Mother    Asthma Father    Heart disease Maternal Grandmother    Breast cancer Maternal Grandmother    Cervical cancer Maternal Grandmother    Hypertension Maternal Grandmother    Diabetes Maternal  Grandmother    Diabetes Maternal Grandfather    Heart disease Maternal Grandfather    Hyperlipidemia Maternal Grandfather     Social History   Socioeconomic History   Marital status: Single    Spouse name: Not on file   Number of children: Not on file   Years of education: Not on file   Highest education level: Not on file  Occupational History   Not on file  Tobacco Use   Smoking status: Every Day    Current packs/day: 0.25    Average packs/day: 0.3 packs/day for 20.0 years (5.0 ttl pk-yrs)    Types: Cigarettes   Smokeless tobacco: Never   Tobacco comments:    5 cig a day  Vaping Use   Vaping status: Never Used  Substance and Sexual Activity   Alcohol use: No   Drug use: Yes    Frequency: 7.0 times per week    Types: Marijuana   Sexual activity: Not Currently    Birth control/protection: Implant  Other Topics Concern   Not on file  Social History Narrative   Right handed    Caffeine  1 daily   Lives in one story home live in client    Social Drivers of Health   Financial Resource Strain: Low Risk  (08/17/2021)   Overall Financial Resource Strain (CARDIA)    Difficulty of Paying Living Expenses: Not  hard at all  Food Insecurity: No Food Insecurity (08/17/2021)   Hunger Vital Sign    Worried About Running Out of Food in the Last Year: Never true    Ran Out of Food in the Last Year: Never true  Transportation Needs: No Transportation Needs (08/17/2021)   PRAPARE - Administrator, Civil Service (Medical): No    Lack of Transportation (Non-Medical): No  Physical Activity: Inactive (08/17/2021)   Exercise Vital Sign    Days of Exercise per Week: 0 days    Minutes of Exercise per Session: 0 min  Stress: Not on file  Social Connections: Not on file  Intimate Partner Violence: Not on file    Review of Systems: ROS negative except for what is noted on the assessment and plan.  Vitals:   08/16/23 0921  BP: 122/87  Pulse: 95  Temp: 97.8 F (36.6 C)   TempSrc: Oral  SpO2: 100%  Weight: (!) 319 lb 6.4 oz (144.9 kg)  Height: 5' 7 (1.702 m)   Physical Exam: Constitutional: alert, sitting up comfortably, in no acute distress Cardiovascular: regular rate and rhythm Pulmonary/Chest: normal work of breathing on room air Neurological: alert & oriented x 3 Psych: normal mood and affect  Assessment & Plan:   Morbid obesity with BMI of 50.0-59.9, adult (HCC) BMI 50.03. Weight has decreased 326 lbs to 319 lbs. Taking Zepbound  2.5 mg weekly for at least 4 weeks. Tolerating well and reports adherence. Has some nausea within 48 hours of injection but resolves. At times feels dehydrated but doing best to hydrate. Last A1c was 5.5 in 02/2023. Will titrate up today.   Plan -Increase Zepbound  to 5 mg weekly, continue titration up as tolerated every 4 weeks (pt can message about new Rx) -Plan f/u OV in 2-3 months  Contraception management Hx of Nexplanon  implanted on 06/2017 by Dr. Winton (OBGYN). Referral resent to OBGYN for removal. States she has not followed up and needs to re-establish.   Vitamin D  deficiency Last vitamin D  low at 6.3, was supplemented with 50,000 units weekly x 6 weeks. She has completed it. Will recheck vitamin D  level today, may benefit from daily supplementation.   Healthcare maintenance -Agreed for HIV and HCV screenings today. No acute concerns.    Patient discussed with Dr. Francesco Ozell Nearing, D.O. [facility], PGY-2 Phone: [phone] Date 08/16/2023 Time 12:13 PM

## 2023-08-17 LAB — HCV INTERPRETATION

## 2023-08-17 LAB — HIV ANTIBODY (ROUTINE TESTING W REFLEX): HIV Screen 4th Generation wRfx: NONREACTIVE

## 2023-08-17 LAB — BMP8+ANION GAP
Anion Gap: 15 mmol/L (ref 10.0–18.0)
BUN/Creatinine Ratio: 11 (ref 9–23)
BUN: 10 mg/dL (ref 6–20)
CO2: 21 mmol/L (ref 20–29)
Calcium: 8.9 mg/dL (ref 8.7–10.2)
Chloride: 107 mmol/L — ABNORMAL HIGH (ref 96–106)
Creatinine, Ser: 0.87 mg/dL (ref 0.57–1.00)
Glucose: 86 mg/dL (ref 70–99)
Potassium: 4.3 mmol/L (ref 3.5–5.2)
Sodium: 143 mmol/L (ref 134–144)
eGFR: 87 mL/min/{1.73_m2} (ref >59)

## 2023-08-17 LAB — HCV AB W REFLEX TO QUANT PCR: HCV Ab: NONREACTIVE

## 2023-08-17 LAB — VITAMIN D 25 HYDROXY (VIT D DEFICIENCY, FRACTURES): Vit D, 25-Hydroxy: 20.3 ng/mL — ABNORMAL LOW (ref 30.0–100.0)

## 2023-08-19 ENCOUNTER — Ambulatory Visit: Payer: Self-pay | Admitting: Student

## 2023-08-20 ENCOUNTER — Other Ambulatory Visit (HOSPITAL_COMMUNITY): Payer: Self-pay

## 2023-08-20 MED ORDER — VITAMIN D3 25 MCG (1000 UNIT) PO TABS
25.0000 ug | ORAL_TABLET | Freq: Every day | ORAL | 3 refills | Status: AC
  Filled 2023-08-20 (×2): qty 100, 100d supply, fill #0

## 2023-08-20 NOTE — Addendum Note (Signed)
 Addended by: ELICIA SHARPER on: 08/20/2023 02:32 PM   Modules accepted: Orders

## 2023-08-23 ENCOUNTER — Emergency Department (HOSPITAL_COMMUNITY)
Admission: EM | Admit: 2023-08-23 | Discharge: 2023-08-23 | Disposition: A | Discharge status: Home / Self Care | Attending: Emergency Medicine | Admitting: Emergency Medicine

## 2023-08-23 ENCOUNTER — Encounter (HOSPITAL_COMMUNITY): Payer: Self-pay

## 2023-08-23 ENCOUNTER — Emergency Department (HOSPITAL_COMMUNITY)

## 2023-08-23 ENCOUNTER — Other Ambulatory Visit: Payer: Self-pay

## 2023-08-23 ENCOUNTER — Ambulatory Visit (HOSPITAL_COMMUNITY): Admission: EM | Admit: 2023-08-23 | Discharge: 2023-08-23 | Disposition: A | Discharge status: Home / Self Care

## 2023-08-23 DIAGNOSIS — R109 Unspecified abdominal pain: Secondary | ICD-10-CM

## 2023-08-23 DIAGNOSIS — R638 Other symptoms and signs concerning food and fluid intake: Secondary | ICD-10-CM | POA: Insufficient documentation

## 2023-08-23 DIAGNOSIS — R531 Weakness: Secondary | ICD-10-CM

## 2023-08-23 DIAGNOSIS — R079 Chest pain, unspecified: Secondary | ICD-10-CM | POA: Insufficient documentation

## 2023-08-23 DIAGNOSIS — R42 Dizziness and giddiness: Secondary | ICD-10-CM | POA: Diagnosis not present

## 2023-08-23 DIAGNOSIS — R112 Nausea with vomiting, unspecified: Secondary | ICD-10-CM | POA: Insufficient documentation

## 2023-08-23 DIAGNOSIS — R55 Syncope and collapse: Secondary | ICD-10-CM | POA: Diagnosis not present

## 2023-08-23 DIAGNOSIS — R61 Generalized hyperhidrosis: Secondary | ICD-10-CM

## 2023-08-23 LAB — LIPASE, BLOOD: Lipase: 20 U/L (ref 11–51)

## 2023-08-23 LAB — CBC WITH DIFFERENTIAL/PLATELET
Abs Immature Granulocytes: 0.02 K/uL (ref 0.00–0.07)
Basophils Absolute: 0 K/uL (ref 0.0–0.1)
Basophils Relative: 0 %
Eosinophils Absolute: 0.1 K/uL (ref 0.0–0.5)
Eosinophils Relative: 1 %
HCT: 42.9 % (ref 36.0–46.0)
Hemoglobin: 14.4 g/dL (ref 12.0–15.0)
Immature Granulocytes: 0 %
Lymphocytes Relative: 19 %
Lymphs Abs: 1.6 K/uL (ref 0.7–4.0)
MCH: 33 pg (ref 26.0–34.0)
MCHC: 33.6 g/dL (ref 30.0–36.0)
MCV: 98.2 fL (ref 80.0–100.0)
Monocytes Absolute: 0.2 K/uL (ref 0.1–1.0)
Monocytes Relative: 3 %
Neutro Abs: 6.4 K/uL (ref 1.7–7.7)
Neutrophils Relative %: 77 %
Platelets: 397 K/uL (ref 150–400)
RBC: 4.37 MIL/uL (ref 3.87–5.11)
RDW: 12.3 % (ref 11.5–15.5)
WBC: 8.3 K/uL (ref 4.0–10.5)
nRBC: 0 % (ref 0.0–0.2)

## 2023-08-23 LAB — COMPREHENSIVE METABOLIC PANEL WITH GFR
ALT: 18 U/L (ref 0–44)
AST: 23 U/L (ref 15–41)
Albumin: 4.1 g/dL (ref 3.5–5.0)
Alkaline Phosphatase: 52 U/L (ref 38–126)
Anion gap: 15 (ref 5–15)
BUN: 9 mg/dL (ref 6–20)
CO2: 19 mmol/L — ABNORMAL LOW (ref 22–32)
Calcium: 9.4 mg/dL (ref 8.9–10.3)
Chloride: 103 mmol/L (ref 98–111)
Creatinine, Ser: 0.97 mg/dL (ref 0.44–1.00)
GFR, Estimated: >60 mL/min (ref >60)
Glucose, Bld: 123 mg/dL — ABNORMAL HIGH (ref 70–99)
Potassium: 3.9 mmol/L (ref 3.5–5.1)
Sodium: 137 mmol/L (ref 135–145)
Total Bilirubin: 0.6 mg/dL (ref 0.0–1.2)
Total Protein: 8 g/dL (ref 6.5–8.1)

## 2023-08-23 LAB — HCG, SERUM, QUALITATIVE: Preg, Serum: NEGATIVE

## 2023-08-23 LAB — RESP PANEL BY RT-PCR (RSV, FLU A&B, COVID)  RVPGX2
Influenza A by PCR: NEGATIVE
Influenza B by PCR: NEGATIVE
Resp Syncytial Virus by PCR: NEGATIVE
SARS Coronavirus 2 by RT PCR: NEGATIVE

## 2023-08-23 LAB — TROPONIN I (HIGH SENSITIVITY): Troponin I (High Sensitivity): 4 ng/L (ref <18)

## 2023-08-23 LAB — CBG MONITORING, ED: Glucose-Capillary: 132 mg/dL — ABNORMAL HIGH (ref 70–99)

## 2023-08-23 MED ORDER — SODIUM CHLORIDE 0.9 % IV SOLN
12.5000 mg | Freq: Once | INTRAVENOUS | Status: AC
  Administered 2023-08-23: 12.5 mg via INTRAVENOUS
  Filled 2023-08-23: qty 12.5

## 2023-08-23 MED ORDER — ONDANSETRON HCL 4 MG/2ML IJ SOLN
4.0000 mg | Freq: Once | INTRAMUSCULAR | Status: AC
  Administered 2023-08-23: 4 mg via INTRAVENOUS
  Filled 2023-08-23: qty 2

## 2023-08-23 MED ORDER — DIPHENHYDRAMINE HCL 50 MG/ML IJ SOLN
12.5000 mg | Freq: Once | INTRAMUSCULAR | Status: AC
  Administered 2023-08-23: 12.5 mg via INTRAVENOUS
  Filled 2023-08-23: qty 1

## 2023-08-23 MED ORDER — SODIUM CHLORIDE 0.9 % IV BOLUS
1000.0000 mL | Freq: Once | INTRAVENOUS | Status: AC
  Administered 2023-08-23: 1000 mL via INTRAVENOUS

## 2023-08-23 MED ORDER — ONDANSETRON 4 MG PO TBDP
4.0000 mg | ORAL_TABLET | Freq: Three times a day (TID) | ORAL | 0 refills | Status: DC | PRN
Start: 2023-08-23 — End: 2023-08-30

## 2023-08-23 MED ORDER — PROMETHAZINE HCL 25 MG PO TABS: 25.0000 mg | ORAL_TABLET | Freq: Three times a day (TID) | ORAL | 0 refills | Status: DC | PRN

## 2023-08-23 MED ORDER — PROMETHAZINE HCL 25 MG RE SUPP
25.0000 mg | Freq: Three times a day (TID) | RECTAL | 0 refills | Status: DC | PRN
Start: 2023-08-23 — End: 2023-08-30

## 2023-08-23 MED ORDER — DROPERIDOL 2.5 MG/ML IJ SOLN
0.6250 mg | Freq: Once | INTRAMUSCULAR | Status: AC
  Administered 2023-08-23: 0.625 mg via INTRAVENOUS
  Filled 2023-08-23: qty 2

## 2023-08-23 NOTE — ED Notes (Signed)
 Patient was lying down and jumped out of the chair and stated she had pain in her epigastric area .

## 2023-08-23 NOTE — ED Triage Notes (Addendum)
 Patient states she took a Zepbound  injection 4 days ago and has been having N/v, abdominal cramping,  and diarrhea. Diarrhea. Patient is diaphoretic during triage.  Patient went to the bathroom prior to coming to triage and stated that she felt like she was going to pass out.  Patient added that she has had blood in hr emesis and diarrhea.

## 2023-08-23 NOTE — Discharge Instructions (Addendum)
 Please STOP taking the Zepbound  medication.  You may be experiencing side effects from this medicine.  Talk to your prescribing doctor about alternative options.  We did not find any signs of dangerous heart conditions, dehydration, pancreatitis, or abdominal emergencies on your workup today. Your blood tests were reassuring and normal.  I expect your symptoms to improve over the next 2-3 days now that you have stopped Zepbound , but reach out to your primary care office to arrange for a follow up appointment next week.  For nausea at home, you can take zofran  dissolving tablets at home, or oral phenergan . For more severe nausea or vomiting you can use the rectal phenergan  suppositories.  Do not exceed the amount prescribed (once every 8 hours for phenergan , or once every 6 hours for zofran ).

## 2023-08-23 NOTE — ED Notes (Signed)
 Patient is being discharged from the Urgent Care and sent to the Emergency Department via POV . Per Rumaldo Ryder, NP, patient is in need of higher level of care due to weakness and diaphoretic. Patient is aware and verbalizes understanding of plan of care.  Vitals:   08/23/23 0906  BP: 136/88  Pulse: 68  Resp: 18  Temp: 98.3 F (36.8 C)  SpO2: 99%

## 2023-08-23 NOTE — Discharge Instructions (Signed)
 Please take her to the emergency department for further evaluation.

## 2023-08-23 NOTE — ED Provider Notes (Signed)
 [place] EMERGENCY DEPARTMENT AT [facility] Provider Note   CSN: [id number] Arrival date & time: 08/23/23  9078     Patient presents with: Near Syncope   Kristin Wright is a 38 y.o. female presenting to the ED complaining that everything is bad.  She was referred in from urgent care with concern for near syncope, nausea, weakness and lightheadedness.  Pt reports this began 3 days ago on Tuesday.  She reports poor appetite, nausea, vomiting, chest pain, lightheadedness, passing out.  She was started on zepbound  1 month ago at 2.5 mg, states it made her nauseous, but her doctor increased it to 5 mg this week before her symptoms started.  Hx of cardiology evaluation for near syncope in 2023, with holter monitor showing no significant events at home, and echocardiogram unremarkabole.   HPI     Prior to Admission medications   Medication Sig Start Date End Date Taking? Authorizing Provider  ondansetron  (ZOFRAN -ODT) 4 MG disintegrating tablet Take 1 tablet (4 mg total) by mouth every 8 (eight) hours as needed for up to 12 doses for nausea or vomiting. 08/23/23  Yes Milana Salay, Donnice PARAS, MD  promethazine  (PHENERGAN ) 25 MG suppository Place 1 suppository (25 mg total) rectally every 8 (eight) hours as needed for up to 6 doses for refractory nausea / vomiting. 08/23/23  Yes Staria Birkhead, Donnice PARAS, MD  promethazine  (PHENERGAN ) 25 MG tablet Take 1 tablet (25 mg total) by mouth every 8 (eight) hours as needed for up to 6 doses for vomiting or nausea. 08/23/23  Yes Cottie Donnice PARAS, MD  tirzepatide  (ZEPBOUND ) 5 MG/0.5ML Pen Inject 5 mg into the skin once a week. 08/16/23  Yes Zheng, Michael, DO  ARIPiprazole  (ABILIFY ) 5 MG tablet Take 1 tablet (5 mg total) by mouth daily. 06/18/23   Carvin Arvella HERO, MD  cholecalciferol  (VITAMIN D3) 25 MCG (1000 UNIT) tablet Take 1 tablet (25 mcg total) by mouth daily. 08/20/23   Zheng, Michael, DO  nicotine  polacrilex (NICORETTE ) 4 MG gum Take 1 each (4 mg total) by  mouth as needed for smoking cessation. 07/16/23   Alexander-Savino, [place], MD  OXcarbazepine  (TRILEPTAL ) 150 MG tablet Take 1 tablet (150 mg total) by mouth 2 (two) times daily. 06/18/23   Carvin Arvella HERO, MD  Rimegepant Sulfate  (NURTEC) 75 MG TBDP Take 1 tablet (75 mg total) by mouth every other day. Patient not taking: Reported on 06/18/2023 04/01/23   Gregary Sharper, MD  tirzepatide  (ZEPBOUND ) 2.5 MG/0.5ML Pen Inject 2.5 mg into the skin once a week. Patient not taking: Reported on 08/23/2023 06/06/23     Vitamin D , Ergocalciferol , (DRISDOL ) 1.25 MG (50000 UNIT) CAPS capsule Take 1 capsule (50,000 Units total) by mouth every 7 (seven) days. TAKE IT ONLY ONCE A WEEK 06/04/23   Tawkaliyar, Roya, DO    Allergies: Aspirin, Milk-related compounds, Penicillins, Soy allergy (obsolete), and Yeast-derived drug products    Review of Systems  Updated Vital Signs BP 122/71   Pulse 65   Temp 97.7 F (36.5 C) (Axillary)   Resp 11   Ht 5' 6 (1.676 m)   Wt (!) 145.2 kg   LMP 08/09/2023 (Approximate)   SpO2 100%   BMI 51.65 kg/m   Physical Exam Constitutional:      General: She is not in acute distress.    Appearance: She is obese.  HENT:     Head: Normocephalic and atraumatic.  Eyes:     Conjunctiva/sclera: Conjunctivae normal.     Pupils: Pupils  are equal, round, and reactive to light.  Cardiovascular:     Rate and Rhythm: Normal rate and regular rhythm.  Pulmonary:     Effort: Pulmonary effort is normal. No respiratory distress.  Abdominal:     General: There is no distension.     Tenderness: There is no abdominal tenderness.  Skin:    General: Skin is warm and dry.  Neurological:     General: No focal deficit present.     Mental Status: She is alert. Mental status is at baseline.  Psychiatric:        Mood and Affect: Mood normal.        Behavior: Behavior normal.     (all labs ordered are listed, but only abnormal results are displayed) Labs Reviewed  COMPREHENSIVE METABOLIC  PANEL WITH GFR - Abnormal; Notable for the following components:      Result Value   CO2 19 (*)    Glucose, Bld 123 (*)    All other components within normal limits  CBG MONITORING, ED - Abnormal; Notable for the following components:   Glucose-Capillary 132 (*)    All other components within normal limits  RESP PANEL BY RT-PCR (RSV, FLU A&B, COVID)  RVPGX2  CBC WITH DIFFERENTIAL/PLATELET  LIPASE, BLOOD  HCG, SERUM, QUALITATIVE  TROPONIN I (HIGH SENSITIVITY)    EKG: EKG Interpretation Date/Time:  Friday August 23 2023 09:39:19 EDT Ventricular Rate:  76 PR Interval:  164 QRS Duration:  92 QT Interval:  424 QTC Calculation: 477 R Axis:   12  Text Interpretation: Sinus rhythm with marked sinus arrhythmia Otherwise normal ECG When compared with ECG of 15-Nov-2021 08:54, PREVIOUS ECG IS PRESENT Confirmed by Cottie Cough [phone]) on 08/23/2023 9:54:36 AM  Radiology: [facility] Chest Portable 1 View Result Date: 08/23/2023 CLINICAL DATA:  38 year old female with chest pressure, diaphoresis, abdominal pain, vomiting. EXAM: PORTABLE CHEST 1 VIEW COMPARISON:  Chest radiographs 11/15/2021 and earlier. FINDINGS: Portable AP semi upright view at 1008 hours. Mildly lordotic positioning. Lung volumes and mediastinal contours are within normal limits. Visualized tracheal air column is within normal limits. Allowing for portable technique the lungs are clear. No pneumothorax or pleural effusion. No acute osseous abnormality identified. Visible bowel gas in the upper abdomen is negative, paucity of gas. IMPRESSION: Negative portable chest. Electronically Signed   By: VEAR Hurst M.D.   On: 08/23/2023 10:20     Procedures   Medications Ordered in the ED  droperidol  (INAPSINE ) 2.5 MG/ML injection 0.625 mg (0.625 mg Intravenous Given 08/23/23 1024)  sodium chloride  0.9 % bolus 1,000 mL (0 mLs Intravenous Stopped 08/23/23 1349)  diphenhydrAMINE  (BENADRYL ) injection 12.5 mg (12.5 mg Intravenous Given 08/23/23 1024)   ondansetron  (ZOFRAN ) injection 4 mg (4 mg Intravenous Given 08/23/23 1024)  promethazine  (PHENERGAN ) 12.5 mg in sodium chloride  0.9 % 50 mL IVPB (12.5 mg Intravenous New Bag/Given 08/23/23 1339)    Clinical Course as of 08/23/23 1528  Fri Aug 23, 2023  1007 Pt having repeated episode where she shuts her eyes and a family member tells me she's passing out but her heart rhythm is NSR and she immediately responds to my voice and touch.  I do not think this is truly a cardiac issue or arrhythmia, and this is not seizure like behavior [MT]  1241 Pt is feeling better, we will po challenge [MT]  1322 Still nauseated, requesting additional nausea medication.  Phenergan  ordered [MT]  1520 Feeling mucjh better and ate food.  I spoke to pt and  her brother and will be discharging home with oral zofran  and rectal phenergan  [MT]    Clinical Course User Index [MT] Zakai Gonyea, Donnice PARAS, MD                                 Medical Decision Making Amount and/or Complexity of Data Reviewed Labs: ordered. Radiology: ordered.  Risk Prescription drug management.   This patient presents to the ED with concern for lightheadedness, nausea, near syncope. This involves an extensive number of treatment options, and is a complaint that carries with it a high risk of complications and morbidity.  The differential diagnosis includes medical side effects, vs viral illness vs anemia vs dehydration vs metabolic derangement vs other  Additional history obtained from family at bedside  External records from outside source obtained and reviewed including LTM and echo from 2023, cardiology evaluation for near syncope  I ordered and personally interpreted labs.  The pertinent results include:  no emergent findings  I ordered imaging studies including dg chest I independently visualized and interpreted imaging which showed no emergent findings I agree with the radiologist interpretation  The patient was maintained on a  cardiac monitor.  I personally viewed and interpreted the cardiac monitored which showed an underlying rhythm of: NSR  Per my interpretation the patient's ECG shows NSR  I ordered medication including IV fluids and antiemetics   I have reviewed the patients home medicines and have made adjustments as needed.  We will discontinue Zepbound   Test Considered: no indication for abdominal CT scan at this time; I have a low suspicion for acute surgical or infectious emergency.  Doubt PE, sepsis, pelvic emergencies  Tele monitor for 6 hours was NSR - no evidence of dangerous arrhythmia  After the interventions noted above, I reevaluated the patient and found that they have: improved   Disposition:  After consideration of the diagnostic results and the patients response to treatment, I feel that the patent would benefit from close outpatient follow up.       Final diagnoses:  Near syncope  Nausea and vomiting, unspecified vomiting type    ED Discharge Orders          Ordered    ondansetron  (ZOFRAN -ODT) 4 MG disintegrating tablet  Every 8 hours PRN        08/23/23 1523    promethazine  (PHENERGAN ) 25 MG suppository  Every 8 hours PRN        08/23/23 1523    promethazine  (PHENERGAN ) 25 MG tablet  Every 8 hours PRN        08/23/23 1523               Cottie Donnice PARAS, MD 08/23/23 1528

## 2023-08-23 NOTE — ED Triage Notes (Signed)
 Pt took zepbound , increased dosage a week ago, since then has had syncopal episodes, is diaphoretic and c/o abdominal pain. Pt has been vomiting. Pt went to UC and was referred here.

## 2023-08-23 NOTE — ED Provider Notes (Signed)
 Patient presents with nausea, vomiting, diarrhea, abdominal cramping, and chest pain that is progressively worsened over the last 4 days.  Patient states that she took Zepbound  4 days ago and then began having the symptoms.  Patient states that she has also had some blood in her vomit intermittently.  Patient denies fever and loss of consciousness.  Patient states that she went to the bathroom prior to being called back for triage and stated that she felt like she was going to pass out.  Upon assessment patient is diaphoretic, ill-appearing, and toxic appearing when entering the room.  Recommended patient be seen in the emergency department for further evaluation and management due to severity of symptoms and current presentation.  Patient is stable to arrived to the ER via POV with friends who have brought her here today.   Johnie Flaming A, NP 08/23/23 [phone]

## 2023-08-25 ENCOUNTER — Emergency Department (HOSPITAL_COMMUNITY)
Admission: EM | Admit: 2023-08-25 | Discharge: 2023-08-25 | Disposition: A | Discharge status: Home / Self Care | Attending: Emergency Medicine | Admitting: Emergency Medicine

## 2023-08-25 ENCOUNTER — Emergency Department (HOSPITAL_COMMUNITY)

## 2023-08-25 DIAGNOSIS — R10819 Abdominal tenderness, unspecified site: Secondary | ICD-10-CM | POA: Diagnosis not present

## 2023-08-25 DIAGNOSIS — R112 Nausea with vomiting, unspecified: Secondary | ICD-10-CM | POA: Diagnosis present

## 2023-08-25 DIAGNOSIS — F17200 Nicotine dependence, unspecified, uncomplicated: Secondary | ICD-10-CM | POA: Diagnosis not present

## 2023-08-25 LAB — URINALYSIS, ROUTINE W REFLEX MICROSCOPIC
Bilirubin Urine: NEGATIVE
Glucose, UA: NEGATIVE mg/dL
Ketones, ur: 80 mg/dL — AB
Nitrite: NEGATIVE
Protein, ur: 30 mg/dL — AB
Specific Gravity, Urine: 1.031 — ABNORMAL HIGH (ref 1.005–1.030)
pH: 6 (ref 5.0–8.0)

## 2023-08-25 LAB — RAPID URINE DRUG SCREEN, HOSP PERFORMED
Amphetamines: NOT DETECTED
Barbiturates: NOT DETECTED
Benzodiazepines: NOT DETECTED
Cocaine: NOT DETECTED
Opiates: NOT DETECTED
Tetrahydrocannabinol: POSITIVE — AB

## 2023-08-25 LAB — CBC WITH DIFFERENTIAL/PLATELET
Abs Immature Granulocytes: 0.02 K/uL (ref 0.00–0.07)
Basophils Absolute: 0 K/uL (ref 0.0–0.1)
Basophils Relative: 0 %
Eosinophils Absolute: 0.3 K/uL (ref 0.0–0.5)
Eosinophils Relative: 4 %
HCT: 38.6 % (ref 36.0–46.0)
Hemoglobin: 13.5 g/dL (ref 12.0–15.0)
Immature Granulocytes: 0 %
Lymphocytes Relative: 35 %
Lymphs Abs: 2.7 K/uL (ref 0.7–4.0)
MCH: 33.9 pg (ref 26.0–34.0)
MCHC: 35 g/dL (ref 30.0–36.0)
MCV: 97 fL (ref 80.0–100.0)
Monocytes Absolute: 0.4 K/uL (ref 0.1–1.0)
Monocytes Relative: 6 %
Neutro Abs: 4.2 K/uL (ref 1.7–7.7)
Neutrophils Relative %: 55 %
Platelets: 353 K/uL (ref 150–400)
RBC: 3.98 MIL/uL (ref 3.87–5.11)
RDW: 12.4 % (ref 11.5–15.5)
WBC: 7.6 K/uL (ref 4.0–10.5)
nRBC: 0 % (ref 0.0–0.2)

## 2023-08-25 LAB — COMPREHENSIVE METABOLIC PANEL WITH GFR
ALT: 17 U/L (ref 0–44)
AST: 21 U/L (ref 15–41)
Albumin: 3.4 g/dL — ABNORMAL LOW (ref 3.5–5.0)
Alkaline Phosphatase: 43 U/L (ref 38–126)
Anion gap: 14 (ref 5–15)
BUN: 11 mg/dL (ref 6–20)
CO2: 20 mmol/L — ABNORMAL LOW (ref 22–32)
Calcium: 9 mg/dL (ref 8.9–10.3)
Chloride: 106 mmol/L (ref 98–111)
Creatinine, Ser: 1.07 mg/dL — ABNORMAL HIGH (ref 0.44–1.00)
GFR, Estimated: >60 mL/min (ref >60)
Glucose, Bld: 111 mg/dL — ABNORMAL HIGH (ref 70–99)
Potassium: 3.5 mmol/L (ref 3.5–5.1)
Sodium: 140 mmol/L (ref 135–145)
Total Bilirubin: 0.4 mg/dL (ref 0.0–1.2)
Total Protein: 6.8 g/dL (ref 6.5–8.1)

## 2023-08-25 LAB — HCG, SERUM, QUALITATIVE: Preg, Serum: NEGATIVE

## 2023-08-25 LAB — LIPASE, BLOOD: Lipase: 28 U/L (ref 11–51)

## 2023-08-25 MED ORDER — IOHEXOL 350 MG/ML SOLN
100.0000 mL | Freq: Once | INTRAVENOUS | Status: AC | PRN
  Administered 2023-08-25: 100 mL via INTRAVENOUS

## 2023-08-25 MED ORDER — DROPERIDOL 2.5 MG/ML IJ SOLN
1.2500 mg | Freq: Once | INTRAMUSCULAR | Status: AC
  Administered 2023-08-25: 1.25 mg via INTRAVENOUS
  Filled 2023-08-25: qty 2

## 2023-08-25 MED ORDER — ONDANSETRON HCL 4 MG/2ML IJ SOLN
4.0000 mg | Freq: Once | INTRAMUSCULAR | Status: AC
  Administered 2023-08-25: 4 mg via INTRAVENOUS
  Filled 2023-08-25: qty 2

## 2023-08-25 NOTE — ED Notes (Signed)
 Patient tolerated sips of ginger ale without vomiting , PA notified .

## 2023-08-25 NOTE — Discharge Instructions (Signed)
 You were seen this morning for nausea and vomiting.  Please take the Phenergan  that was prescribed 2 days ago.  You have both oral options and suppository options.  If this fails to control your nausea and you are unable to keep down any fluids please return to the emergency department.  Follow-up with your primary care provider for further recommendations on your Zepbound .  I do recommend complete cessation of marijuana/THC as this can cause severe nausea and vomiting.

## 2023-08-25 NOTE — ED Notes (Signed)
 Patient transported to CT scan .

## 2023-08-25 NOTE — ED Provider Notes (Signed)
 [place] EMERGENCY DEPARTMENT AT [facility] Provider Note   CSN: [id number] Arrival date & time: 08/25/23  0007     Patient presents with: Emesis   Kristin Wright is a 38 y.o. female.  Patient with past medical history significant for obesity, cannabinoid hyperemesis syndrome, anxiety presents to the emergency room complaining of intractable nausea and vomiting with abdominal pain and diarrhea.  Symptoms began a few days ago.  She states she was evaluated on July 4 for the same at the same hospital and was treated with multiple antiemetic medications and felt better before being discharged home.  She states that since going home she has had continued nausea with vomiting and now has diarrhea.  She states that her Zepbound  was recently increased from 2-1/2 mg to 5 mg earlier this week.  She does endorse using marijuana yesterday.  She denies chest pain, shortness of breath, urinary symptoms.    Emesis      Prior to Admission medications   Medication Sig Start Date End Date Taking? Authorizing Provider  ARIPiprazole  (ABILIFY ) 5 MG tablet Take 1 tablet (5 mg total) by mouth daily. 06/18/23   Carvin Arvella HERO, MD  cholecalciferol  (VITAMIN D3) 25 MCG (1000 UNIT) tablet Take 1 tablet (25 mcg total) by mouth daily. 08/20/23   Zheng, Michael, DO  nicotine  polacrilex (NICORETTE ) 4 MG gum Take 1 each (4 mg total) by mouth as needed for smoking cessation. 07/16/23   Alexander-Savino, [place], MD  ondansetron  (ZOFRAN -ODT) 4 MG disintegrating tablet Take 1 tablet (4 mg total) by mouth every 8 (eight) hours as needed for up to 12 doses for nausea or vomiting. 08/23/23   Cottie Donnice PARAS, MD  OXcarbazepine  (TRILEPTAL ) 150 MG tablet Take 1 tablet (150 mg total) by mouth 2 (two) times daily. 06/18/23   Carvin Arvella HERO, MD  promethazine  (PHENERGAN ) 25 MG suppository Place 1 suppository (25 mg total) rectally every 8 (eight) hours as needed for up to 6 doses for refractory nausea / vomiting. 08/23/23    Cottie Donnice PARAS, MD  promethazine  (PHENERGAN ) 25 MG tablet Take 1 tablet (25 mg total) by mouth every 8 (eight) hours as needed for up to 6 doses for vomiting or nausea. 08/23/23   Cottie Donnice PARAS, MD  Rimegepant Sulfate  (NURTEC) 75 MG TBDP Take 1 tablet (75 mg total) by mouth every other day. Patient not taking: Reported on 05/21/2023 04/01/23   Gregary Sharper, MD  Vitamin D , Ergocalciferol , (DRISDOL ) 1.25 MG (50000 UNIT) CAPS capsule Take 1 capsule (50,000 Units total) by mouth every 7 (seven) days. TAKE IT ONLY ONCE A WEEK 06/04/23   Tawkaliyar, Roya, DO    Allergies: Aspirin, Milk-related compounds, Penicillins, Soy allergy (obsolete), and Yeast-derived drug products    Review of Systems  Gastrointestinal:  Positive for vomiting.    Updated Vital Signs BP (!) 109/50 (BP Location: Left Arm)   Pulse (!) 57   Temp (!) 97.2 F (36.2 C) (Oral)   Resp 16   LMP 08/09/2023 (Approximate)   SpO2 100%   Physical Exam Vitals and nursing note reviewed.  Constitutional:      General: She is not in acute distress.    Appearance: She is well-developed.  HENT:     Head: Normocephalic and atraumatic.  Eyes:     Conjunctiva/sclera: Conjunctivae normal.  Cardiovascular:     Rate and Rhythm: Normal rate and regular rhythm.  Pulmonary:     Effort: Pulmonary effort is normal. No respiratory distress.  Breath sounds: Normal breath sounds.  Abdominal:     Palpations: Abdomen is soft.     Tenderness: There is abdominal tenderness.  Musculoskeletal:        General: No swelling.     Cervical back: Neck supple.  Skin:    General: Skin is warm and dry.     Capillary Refill: Capillary refill takes less than 2 seconds.  Neurological:     Mental Status: She is alert.  Psychiatric:        Mood and Affect: Mood normal.     (all labs ordered are listed, but only abnormal results are displayed) Labs Reviewed  COMPREHENSIVE METABOLIC PANEL WITH GFR - Abnormal; Notable for the following  components:      Result Value   CO2 20 (*)    Glucose, Bld 111 (*)    Creatinine, Ser 1.07 (*)    Albumin 3.4 (*)    All other components within normal limits  URINALYSIS, ROUTINE W REFLEX MICROSCOPIC - Abnormal; Notable for the following components:   Color, Urine AMBER (*)    APPearance CLOUDY (*)    Specific Gravity, Urine 1.031 (*)    Hgb urine dipstick MODERATE (*)    Ketones, ur 80 (*)    Protein, ur 30 (*)    Leukocytes,Ua SMALL (*)    Bacteria, UA RARE (*)    All other components within normal limits  RAPID URINE DRUG SCREEN, HOSP PERFORMED - Abnormal; Notable for the following components:   Tetrahydrocannabinol POSITIVE (*)    All other components within normal limits  CBC WITH DIFFERENTIAL/PLATELET  LIPASE, BLOOD  HCG, SERUM, QUALITATIVE    EKG: None  Radiology: CT ABDOMEN PELVIS W CONTRAST Result Date: 08/25/2023 CLINICAL DATA:  Abdominal pain, acute, nonlocalized EXAM: CT ABDOMEN AND PELVIS WITH CONTRAST TECHNIQUE: Multidetector CT imaging of the abdomen and pelvis was performed using the standard protocol following bolus administration of intravenous contrast. RADIATION DOSE REDUCTION: This exam was performed according to the departmental dose-optimization program which includes automated exposure control, adjustment of the mA and/or kV according to patient size and/or use of iterative reconstruction technique. CONTRAST:  OMNIPAQUE  IOHEXOL  350 MG/ML SOLN COMPARISON:  CT abdomen pelvis 06/07/2012 FINDINGS: Lower chest: No acute abnormality. Hepatobiliary: Liver is enlarged measuring up to 22 cm. No focal liver abnormality. No gallstones, gallbladder wall thickening, or pericholecystic fluid. No biliary dilatation. Pancreas: No focal lesion. Normal pancreatic contour. No surrounding inflammatory changes. No main pancreatic ductal dilatation. Spleen: Normal in size without focal abnormality. Adrenals/Urinary Tract: No adrenal nodule bilaterally. Bilateral kidneys enhance  symmetrically. Subcentimeter hypodensities are too small to characterize-no further follow-up indicated. No hydronephrosis. No hydroureter. No nephroureterolithiasis bilaterally. The urinary bladder is unremarkable. Stomach/Bowel: Stomach is within normal limits. No evidence of bowel wall thickening or dilatation. Appendix appears normal. Vascular/Lymphatic: No abdominal aorta or iliac aneurysm. No abdominal, pelvic, or inguinal lymphadenopathy. Reproductive: Uterus and bilateral adnexa are unremarkable. Other: No intraperitoneal free fluid. No intraperitoneal free gas. No organized fluid collection. Musculoskeletal: No abdominal wall hernia or abnormality. No suspicious lytic or blastic osseous lesions. No acute displaced fracture. IMPRESSION: 1. No acute intra-abdominal or intrapelvic abnormality. 2. Hepatomegaly. Electronically Signed   By: Morgane  Naveau M.D.   On: 08/25/2023 01:26   DG Chest Portable 1 View Result Date: 08/23/2023 CLINICAL DATA:  38 year old female with chest pressure, diaphoresis, abdominal pain, vomiting. EXAM: PORTABLE CHEST 1 VIEW COMPARISON:  Chest radiographs 11/15/2021 and earlier. FINDINGS: Portable AP semi upright view at 1008 hours.  Mildly lordotic positioning. Lung volumes and mediastinal contours are within normal limits. Visualized tracheal air column is within normal limits. Allowing for portable technique the lungs are clear. No pneumothorax or pleural effusion. No acute osseous abnormality identified. Visible bowel gas in the upper abdomen is negative, paucity of gas. IMPRESSION: Negative portable chest. Electronically Signed   By: VEAR Hurst M.D.   On: 08/23/2023 10:20     Procedures   Medications Ordered in the ED  ondansetron  (ZOFRAN ) injection 4 mg (4 mg Intravenous Given 08/25/23 0018)  iohexol  (OMNIPAQUE ) 350 MG/ML injection 100 mL (100 mLs Intravenous Contrast Given 08/25/23 0112)  droperidol  (INAPSINE ) 2.5 MG/ML injection 1.25 mg (1.25 mg Intravenous Given 08/25/23  0149)                                    Medical Decision Making Amount and/or Complexity of Data Reviewed Labs: ordered. Radiology: ordered.  Risk Prescription drug management.   This patient presents to the ED for concern of abdominal pain with nausea and vomiting, this involves an extensive number of treatment options, and is a complaint that carries with it a high risk of complications and morbidity.  The differential diagnosis includes cannabinoid hyperemesis syndrome, adverse effect of medication, gastroenteritis, cholecystitis, appendicitis, others   Co morbidities / Chronic conditions that complicate the patient evaluation  History of cannabinoid hyperemesis syndrome, recent Zepbound  dosage increased, anxiety   Additional history obtained:  Additional history obtained from EMR External records from outside source obtained and reviewed including internal medicine notes   Lab Tests:  I Ordered, and personally interpreted labs.  The pertinent results include: Grossly unremarkable CMP, CBC; UDS positive for THC   Imaging Studies ordered:  I ordered imaging studies including CT abdomen pelvis with contrast I independently visualized and interpreted imaging which showed  1. No acute intra-abdominal or intrapelvic abnormality.  2. Hepatomegaly.   I agree with the radiologist interpretation   Problem List / ED Course / Critical interventions / Medication management   I ordered medication including Zofran , droperidol  Reevaluation of the patient after these medicines showed that the patient improved I have reviewed the patients home medicines and have made adjustments as needed   Social Determinants of Health:  Patient has Medicaid for her primary health insurance type, is a daily tobacco smoker   Test / Admission - Considered:  Patient able to tolerate oral intake after droperidol .  Patient would like to discharge home.  She responded well to Phenergan  at a  previous ER visit 2 days ago.  I will prescribe Phenergan  suppositories at this time for the patient.  If she develops intractable vomiting she should return to the emergency department.  I have also advised her to follow-up with her primary care team for further evaluation and management as needed.  Unclear if this may be an adverse reaction to her Zepbound  versus cannabinoid hyperemesis.  No indication at this time for admission.      Final diagnoses:  Nausea and vomiting, unspecified vomiting type    ED Discharge Orders     None          Logan Ubaldo KATHEE DEVONNA 08/25/23 0525    Carita Senior, MD 08/25/23 [phone]

## 2023-08-25 NOTE — ED Triage Notes (Addendum)
 Patient reports persistent emesis with diarrhea and low abdominal pain this evening , she adds  increase in her weight loss medication dose this week / cannabis intake yesterday. CBG=142.

## 2023-08-26 ENCOUNTER — Emergency Department (HOSPITAL_COMMUNITY)

## 2023-08-26 ENCOUNTER — Ambulatory Visit: Payer: Self-pay

## 2023-08-26 ENCOUNTER — Inpatient Hospital Stay (HOSPITAL_COMMUNITY)
Admission: EM | Admit: 2023-08-26 | Discharge: 2023-08-30 | DRG: 378 | Disposition: A | Discharge status: Home / Self Care | Attending: Internal Medicine | Admitting: Internal Medicine

## 2023-08-26 ENCOUNTER — Encounter (HOSPITAL_COMMUNITY): Payer: Self-pay

## 2023-08-26 ENCOUNTER — Other Ambulatory Visit: Payer: Self-pay

## 2023-08-26 DIAGNOSIS — Z79899 Other long term (current) drug therapy: Secondary | ICD-10-CM

## 2023-08-26 DIAGNOSIS — Z825 Family history of asthma and other chronic lower respiratory diseases: Secondary | ICD-10-CM

## 2023-08-26 DIAGNOSIS — Z91011 Allergy to milk products: Secondary | ICD-10-CM

## 2023-08-26 DIAGNOSIS — Z91199 Patient's noncompliance with other medical treatment and regimen due to unspecified reason: Secondary | ICD-10-CM

## 2023-08-26 DIAGNOSIS — Z87892 Personal history of anaphylaxis: Secondary | ICD-10-CM

## 2023-08-26 DIAGNOSIS — Z886 Allergy status to analgesic agent status: Secondary | ICD-10-CM

## 2023-08-26 DIAGNOSIS — E876 Hypokalemia: Secondary | ICD-10-CM | POA: Diagnosis present

## 2023-08-26 DIAGNOSIS — R112 Nausea with vomiting, unspecified: Secondary | ICD-10-CM | POA: Diagnosis not present

## 2023-08-26 DIAGNOSIS — F419 Anxiety disorder, unspecified: Secondary | ICD-10-CM | POA: Diagnosis present

## 2023-08-26 DIAGNOSIS — Z91018 Allergy to other foods: Secondary | ICD-10-CM

## 2023-08-26 DIAGNOSIS — Z880 Allergy status to penicillin: Secondary | ICD-10-CM

## 2023-08-26 DIAGNOSIS — Z833 Family history of diabetes mellitus: Secondary | ICD-10-CM

## 2023-08-26 DIAGNOSIS — R0789 Other chest pain: Secondary | ICD-10-CM | POA: Diagnosis present

## 2023-08-26 DIAGNOSIS — Z7985 Long-term (current) use of injectable non-insulin antidiabetic drugs: Secondary | ICD-10-CM

## 2023-08-26 DIAGNOSIS — G4733 Obstructive sleep apnea (adult) (pediatric): Secondary | ICD-10-CM | POA: Diagnosis present

## 2023-08-26 DIAGNOSIS — F17210 Nicotine dependence, cigarettes, uncomplicated: Secondary | ICD-10-CM | POA: Diagnosis present

## 2023-08-26 DIAGNOSIS — E66813 Obesity, class 3: Secondary | ICD-10-CM | POA: Diagnosis present

## 2023-08-26 DIAGNOSIS — R197 Diarrhea, unspecified: Secondary | ICD-10-CM | POA: Diagnosis present

## 2023-08-26 DIAGNOSIS — F319 Bipolar disorder, unspecified: Secondary | ICD-10-CM | POA: Diagnosis present

## 2023-08-26 DIAGNOSIS — Z6841 Body Mass Index (BMI) 40.0 and over, adult: Secondary | ICD-10-CM

## 2023-08-26 DIAGNOSIS — R09A2 Foreign body sensation, throat: Secondary | ICD-10-CM | POA: Diagnosis present

## 2023-08-26 DIAGNOSIS — E785 Hyperlipidemia, unspecified: Secondary | ICD-10-CM | POA: Diagnosis present

## 2023-08-26 DIAGNOSIS — Z8249 Family history of ischemic heart disease and other diseases of the circulatory system: Secondary | ICD-10-CM

## 2023-08-26 DIAGNOSIS — Z803 Family history of malignant neoplasm of breast: Secondary | ICD-10-CM

## 2023-08-26 DIAGNOSIS — Z83438 Family history of other disorder of lipoprotein metabolism and other lipidemia: Secondary | ICD-10-CM

## 2023-08-26 DIAGNOSIS — E872 Acidosis, unspecified: Secondary | ICD-10-CM | POA: Diagnosis present

## 2023-08-26 DIAGNOSIS — G43909 Migraine, unspecified, not intractable, without status migrainosus: Secondary | ICD-10-CM | POA: Diagnosis present

## 2023-08-26 DIAGNOSIS — E861 Hypovolemia: Secondary | ICD-10-CM | POA: Diagnosis present

## 2023-08-26 DIAGNOSIS — R042 Hemoptysis: Secondary | ICD-10-CM | POA: Diagnosis present

## 2023-08-26 DIAGNOSIS — K219 Gastro-esophageal reflux disease without esophagitis: Secondary | ICD-10-CM | POA: Diagnosis present

## 2023-08-26 DIAGNOSIS — Z720 Tobacco use: Secondary | ICD-10-CM | POA: Diagnosis present

## 2023-08-26 DIAGNOSIS — Z823 Family history of stroke: Secondary | ICD-10-CM

## 2023-08-26 DIAGNOSIS — F1290 Cannabis use, unspecified, uncomplicated: Secondary | ICD-10-CM | POA: Diagnosis not present

## 2023-08-26 DIAGNOSIS — Z8049 Family history of malignant neoplasm of other genital organs: Secondary | ICD-10-CM

## 2023-08-26 DIAGNOSIS — K920 Hematemesis: Principal | ICD-10-CM | POA: Diagnosis present

## 2023-08-26 LAB — COMPREHENSIVE METABOLIC PANEL WITH GFR
ALT: 17 U/L (ref 0–44)
AST: 20 U/L (ref 15–41)
Albumin: 3.7 g/dL (ref 3.5–5.0)
Alkaline Phosphatase: 44 U/L (ref 38–126)
Anion gap: 16 — ABNORMAL HIGH (ref 5–15)
BUN: 6 mg/dL (ref 6–20)
CO2: 21 mmol/L — ABNORMAL LOW (ref 22–32)
Calcium: 9 mg/dL (ref 8.9–10.3)
Chloride: 99 mmol/L (ref 98–111)
Creatinine, Ser: 0.99 mg/dL (ref 0.44–1.00)
GFR, Estimated: >60 mL/min (ref >60)
Glucose, Bld: 109 mg/dL — ABNORMAL HIGH (ref 70–99)
Potassium: 3.4 mmol/L — ABNORMAL LOW (ref 3.5–5.1)
Sodium: 136 mmol/L (ref 135–145)
Total Bilirubin: 0.7 mg/dL (ref 0.0–1.2)
Total Protein: 7.3 g/dL (ref 6.5–8.1)

## 2023-08-26 LAB — CBC
HCT: 41.1 % (ref 36.0–46.0)
Hemoglobin: 14.1 g/dL (ref 12.0–15.0)
MCH: 33.3 pg (ref 26.0–34.0)
MCHC: 34.3 g/dL (ref 30.0–36.0)
MCV: 97.2 fL (ref 80.0–100.0)
Platelets: 422 K/uL — ABNORMAL HIGH (ref 150–400)
RBC: 4.23 MIL/uL (ref 3.87–5.11)
RDW: 12.5 % (ref 11.5–15.5)
WBC: 8.7 K/uL (ref 4.0–10.5)
nRBC: 0 % (ref 0.0–0.2)

## 2023-08-26 LAB — HCG, SERUM, QUALITATIVE: Preg, Serum: NEGATIVE

## 2023-08-26 LAB — URINALYSIS, ROUTINE W REFLEX MICROSCOPIC
Bilirubin Urine: NEGATIVE
Glucose, UA: NEGATIVE mg/dL
Ketones, ur: 80 mg/dL — AB
Leukocytes,Ua: NEGATIVE
Nitrite: NEGATIVE
Protein, ur: 100 mg/dL — AB
RBC / HPF: >50 RBC/hpf (ref 0–5)
Specific Gravity, Urine: 1.031 — ABNORMAL HIGH (ref 1.005–1.030)
pH: 6 (ref 5.0–8.0)

## 2023-08-26 LAB — LIPASE, BLOOD: Lipase: 24 U/L (ref 11–51)

## 2023-08-26 LAB — I-STAT CG4 LACTIC ACID, ED: Lactic Acid, Venous: 0.9 mmol/L (ref 0.5–1.9)

## 2023-08-26 LAB — TROPONIN I (HIGH SENSITIVITY): Troponin I (High Sensitivity): 5 ng/L (ref <18)

## 2023-08-26 MED ORDER — LACTATED RINGERS IV BOLUS
1000.0000 mL | Freq: Once | INTRAVENOUS | Status: AC
  Administered 2023-08-26: 1000 mL via INTRAVENOUS

## 2023-08-26 MED ORDER — SUCRALFATE 1 GM/10ML PO SUSP
1.0000 g | Freq: Three times a day (TID) | ORAL | Status: AC
  Administered 2023-08-26 – 2023-08-27 (×3): 1 g via ORAL
  Filled 2023-08-26 (×5): qty 10

## 2023-08-26 MED ORDER — PANTOPRAZOLE SODIUM 40 MG IV SOLR: 40.0000 mg | Freq: Every day | INTRAVENOUS | Status: DC

## 2023-08-26 MED ORDER — PANTOPRAZOLE SODIUM 40 MG IV SOLR
40.0000 mg | Freq: Once | INTRAVENOUS | Status: AC
  Administered 2023-08-26: 40 mg via INTRAVENOUS
  Filled 2023-08-26: qty 10

## 2023-08-26 MED ORDER — ONDANSETRON HCL 4 MG/2ML IJ SOLN: 4.0000 mg | Freq: Four times a day (QID) | INTRAMUSCULAR | Status: DC

## 2023-08-26 MED ORDER — MORPHINE SULFATE (PF) 4 MG/ML IV SOLN: 4.0000 mg | Freq: Once | INTRAVENOUS | Status: DC

## 2023-08-26 MED ORDER — POTASSIUM CHLORIDE CRYS ER 20 MEQ PO TBCR
40.0000 meq | EXTENDED_RELEASE_TABLET | Freq: Once | ORAL | Status: AC
  Administered 2023-08-26: 40 meq via ORAL
  Filled 2023-08-26: qty 2

## 2023-08-26 MED ORDER — ONDANSETRON 4 MG PO TBDP
4.0000 mg | ORAL_TABLET | Freq: Once | ORAL | Status: AC | PRN
  Administered 2023-08-26: 4 mg via ORAL
  Filled 2023-08-26: qty 1

## 2023-08-26 MED ORDER — ARIPIPRAZOLE 5 MG PO TABS
5.0000 mg | ORAL_TABLET | Freq: Every day | ORAL | Status: DC
  Administered 2023-08-27 – 2023-08-29 (×3): 5 mg via ORAL
  Filled 2023-08-26 (×5): qty 1

## 2023-08-26 MED ORDER — ENOXAPARIN SODIUM 80 MG/0.8ML IJ SOSY
70.0000 mg | PREFILLED_SYRINGE | INTRAMUSCULAR | Status: DC
  Administered 2023-08-26 – 2023-08-28 (×3): 70 mg via SUBCUTANEOUS
  Filled 2023-08-26: qty 0.7
  Filled 2023-08-26 (×2): qty 0.8

## 2023-08-26 MED ORDER — SODIUM CHLORIDE 0.9 % IV BOLUS: 1000.0000 mL | Freq: Once | INTRAVENOUS | Status: DC

## 2023-08-26 MED ORDER — ONDANSETRON HCL 4 MG/2ML IJ SOLN
4.0000 mg | Freq: Four times a day (QID) | INTRAMUSCULAR | Status: AC
  Administered 2023-08-26 – 2023-08-27 (×3): 4 mg via INTRAVENOUS
  Filled 2023-08-26 (×3): qty 2

## 2023-08-26 MED ORDER — ONDANSETRON HCL 4 MG/2ML IJ SOLN
4.0000 mg | Freq: Once | INTRAMUSCULAR | Status: AC
  Administered 2023-08-26: 4 mg via INTRAVENOUS
  Filled 2023-08-26: qty 2

## 2023-08-26 MED ORDER — OXCARBAZEPINE 150 MG PO TABS
150.0000 mg | ORAL_TABLET | Freq: Two times a day (BID) | ORAL | Status: DC
  Administered 2023-08-26 – 2023-08-29 (×6): 150 mg via ORAL
  Filled 2023-08-26 (×8): qty 1

## 2023-08-26 MED ORDER — DROPERIDOL 2.5 MG/ML IJ SOLN
1.2500 mg | Freq: Once | INTRAMUSCULAR | Status: AC
  Administered 2023-08-26: 1.25 mg via INTRAVENOUS
  Filled 2023-08-26: qty 2

## 2023-08-26 MED ORDER — SCOPOLAMINE 1 MG/3DAYS TD PT72
1.0000 | MEDICATED_PATCH | TRANSDERMAL | Status: DC
  Administered 2023-08-26: 1.5 mg via TRANSDERMAL
  Filled 2023-08-26: qty 1

## 2023-08-26 NOTE — ED Triage Notes (Signed)
 Pt came in via POV d/t constant n/v. States she was had a recent change on her weight loss medicine that started her constant n/v & was seen & Tx for it. Pt reports she was told that she has hyperemesis from her Marijuana use & has not smoked the past 4 days & has not been able to take any of her home meds & her s/s of night sweats, n/v has started back up. A/Ox4, reports 10/10 throat pain d/t her tongue feeling swollen.

## 2023-08-26 NOTE — Hospital Course (Signed)
 Last Zepbound  admin date:  Dose: Monday  6/27  Trial of oral antiemetics from the emergency department   Last diarrhea on 7/7 Last day smoked diarrhea Thursday   Intractable nausea and vomiting Cannabinoid hyperemesis syndrome Bipolar depression syndrome  Abilify  Triliptal Oxcarbazepine    Psychiatry - Cher Ege MD  Promethazine  25 mg   3-4 cigarettes Qtc 447

## 2023-08-26 NOTE — ED Provider Notes (Signed)
  EMERGENCY DEPARTMENT AT [facility] Provider Note   CSN: [id number] Arrival date & time: 08/26/23  9184     Patient presents with: Nausea and Emesis   Kristin Wright is a 38 y.o. female with PMHx cannabinoid hyperemesis syndrome, anxiety, HLD, migraines who presents to ED concerned for nausea and vomiting x6 days. Patient also endorsing blood mixed in with her bilious vomiting that started Friday. Patient also endorsing diarrhea that started Friday - last diarrheal episode was yesterday afternoon. Patient also endorsing intermittent SOB and constant chest pain that has been present since Friday. The SOB and chest pain are not associated with rest vs exertion vs food intake. Patient stating that it feels like her abdominal pain is coming from RUQ.   Patient with recent increase in Zepbound  on Monday. Patient also with marijuana use 2 days ago.  This is patient's 3rd time being treated for similar symptoms this past week. Patient actively wretching in ED room during my initial interview. Zofran  did not provide any relief.  Denies fever, dysuria, hematuria, hematochezia.     Emesis      Prior to Admission medications   Medication Sig Start Date End Date Taking? Authorizing Provider  ARIPiprazole  (ABILIFY ) 5 MG tablet Take 1 tablet (5 mg total) by mouth daily. 06/18/23   Carvin Arvella HERO, MD  cholecalciferol  (VITAMIN D3) 25 MCG (1000 UNIT) tablet Take 1 tablet (25 mcg total) by mouth daily. 08/20/23   Zheng, Michael, DO  nicotine  polacrilex (NICORETTE ) 4 MG gum Take 1 each (4 mg total) by mouth as needed for smoking cessation. 07/16/23   Alexander-Savino, [place], MD  ondansetron  (ZOFRAN -ODT) 4 MG disintegrating tablet Take 1 tablet (4 mg total) by mouth every 8 (eight) hours as needed for up to 12 doses for nausea or vomiting. 08/23/23   Cottie Donnice PARAS, MD  OXcarbazepine  (TRILEPTAL ) 150 MG tablet Take 1 tablet (150 mg total) by mouth 2 (two) times daily. 06/18/23    Carvin Arvella HERO, MD  promethazine  (PHENERGAN ) 25 MG suppository Place 1 suppository (25 mg total) rectally every 8 (eight) hours as needed for up to 6 doses for refractory nausea / vomiting. 08/23/23   Cottie Donnice PARAS, MD  promethazine  (PHENERGAN ) 25 MG tablet Take 1 tablet (25 mg total) by mouth every 8 (eight) hours as needed for up to 6 doses for vomiting or nausea. 08/23/23   Cottie Donnice PARAS, MD  Rimegepant Sulfate  (NURTEC) 75 MG TBDP Take 1 tablet (75 mg total) by mouth every other day. Patient not taking: Reported on 05/21/2023 04/01/23   Gregary Sharper, MD  Vitamin D , Ergocalciferol , (DRISDOL ) 1.25 MG (50000 UNIT) CAPS capsule Take 1 capsule (50,000 Units total) by mouth every 7 (seven) days. TAKE IT ONLY ONCE A WEEK 06/04/23   Tawkaliyar, Roya, DO    Allergies: Aspirin, Milk-related compounds, Penicillins, Soy allergy (obsolete), and Yeast-derived drug products    Review of Systems  Gastrointestinal:  Positive for vomiting.    Updated Vital Signs BP (!) 137/59   Pulse (!) 45   Temp 98.4 F (36.9 C) (Oral)   Resp 18   LMP 08/09/2023 (Approximate)   SpO2 96%   Physical Exam Vitals and nursing note reviewed.  Constitutional:      General: She is not in acute distress.    Appearance: She is ill-appearing. She is not toxic-appearing.  HENT:     Head: Normocephalic and atraumatic.     Mouth/Throat:     Mouth: Mucous membranes are  moist.  Eyes:     General: No scleral icterus.       Right eye: No discharge.        Left eye: No discharge.     Conjunctiva/sclera: Conjunctivae normal.  Cardiovascular:     Rate and Rhythm: Normal rate and regular rhythm.     Pulses: Normal pulses.     Heart sounds: Normal heart sounds. No murmur heard. Pulmonary:     Effort: Pulmonary effort is normal. No respiratory distress.     Breath sounds: Normal breath sounds. No wheezing, rhonchi or rales.  Abdominal:     General: Abdomen is flat. Bowel sounds are normal. There is no distension.      Palpations: Abdomen is soft. There is no mass.     Tenderness: There is abdominal tenderness.     Comments: Generalized abdominal tenderness to palpation greatest in the RUQ.  Musculoskeletal:     Right lower leg: No edema.     Left lower leg: No edema.     Comments: Patient's chest pain is reproducible to palpation  Skin:    General: Skin is warm and dry.     Findings: No rash.  Neurological:     General: No focal deficit present.     Mental Status: She is alert and oriented to person, place, and time. Mental status is at baseline.  Psychiatric:        Mood and Affect: Mood normal.        Behavior: Behavior normal.     (all labs ordered are listed, but only abnormal results are displayed) Labs Reviewed  COMPREHENSIVE METABOLIC PANEL WITH GFR - Abnormal; Notable for the following components:      Result Value   Potassium 3.4 (*)    CO2 21 (*)    Glucose, Bld 109 (*)    Anion gap 16 (*)    All other components within normal limits  CBC - Abnormal; Notable for the following components:   Platelets 422 (*)    All other components within normal limits  LIPASE, BLOOD  HCG, SERUM, QUALITATIVE  URINALYSIS, ROUTINE W REFLEX MICROSCOPIC  TROPONIN I (HIGH SENSITIVITY)    EKG: None  Radiology: DG Chest 2 View Result Date: 08/26/2023 CLINICAL DATA:  Chest pain. EXAM: CHEST - 2 VIEW COMPARISON:  08/23/2023. FINDINGS: Stable cardiomediastinal silhouette. No focal consolidation, pleural effusion, or pneumothorax. No acute osseous abnormality. IMPRESSION: No acute cardiopulmonary findings. Electronically Signed   By: Harrietta Sherry M.D.   On: 08/26/2023 13:03   US  Abdomen Limited RUQ (LIVER/GB) Result Date: 08/26/2023 CLINICAL DATA:  Right upper quadrant abdominal pain. EXAM: ULTRASOUND ABDOMEN LIMITED RIGHT UPPER QUADRANT COMPARISON:  Ultrasound dated 08/16/2007. FINDINGS: Gallbladder: No gallstones or wall thickening visualized. No sonographic Murphy sign noted by sonographer. Common  bile duct: Diameter: 4 mm Liver: No focal lesion identified. Within normal limits in parenchymal echogenicity. Portal vein is patent on color Doppler imaging with normal direction of blood flow towards the liver. Other: None. IMPRESSION: Unremarkable right upper quadrant ultrasound. Electronically Signed   By: Vanetta Chou M.D.   On: 08/26/2023 10:23   CT ABDOMEN PELVIS W CONTRAST Result Date: 08/25/2023 CLINICAL DATA:  Abdominal pain, acute, nonlocalized EXAM: CT ABDOMEN AND PELVIS WITH CONTRAST TECHNIQUE: Multidetector CT imaging of the abdomen and pelvis was performed using the standard protocol following bolus administration of intravenous contrast. RADIATION DOSE REDUCTION: This exam was performed according to the departmental dose-optimization program which includes automated exposure control, adjustment of the  mA and/or kV according to patient size and/or use of iterative reconstruction technique. CONTRAST:  OMNIPAQUE  IOHEXOL  350 MG/ML SOLN COMPARISON:  CT abdomen pelvis 06/07/2012 FINDINGS: Lower chest: No acute abnormality. Hepatobiliary: Liver is enlarged measuring up to 22 cm. No focal liver abnormality. No gallstones, gallbladder wall thickening, or pericholecystic fluid. No biliary dilatation. Pancreas: No focal lesion. Normal pancreatic contour. No surrounding inflammatory changes. No main pancreatic ductal dilatation. Spleen: Normal in size without focal abnormality. Adrenals/Urinary Tract: No adrenal nodule bilaterally. Bilateral kidneys enhance symmetrically. Subcentimeter hypodensities are too small to characterize-no further follow-up indicated. No hydronephrosis. No hydroureter. No nephroureterolithiasis bilaterally. The urinary bladder is unremarkable. Stomach/Bowel: Stomach is within normal limits. No evidence of bowel wall thickening or dilatation. Appendix appears normal. Vascular/Lymphatic: No abdominal aorta or iliac aneurysm. No abdominal, pelvic, or inguinal lymphadenopathy.  Reproductive: Uterus and bilateral adnexa are unremarkable. Other: No intraperitoneal free fluid. No intraperitoneal free gas. No organized fluid collection. Musculoskeletal: No abdominal wall hernia or abnormality. No suspicious lytic or blastic osseous lesions. No acute displaced fracture. IMPRESSION: 1. No acute intra-abdominal or intrapelvic abnormality. 2. Hepatomegaly. Electronically Signed   By: Morgane  Naveau M.D.   On: 08/25/2023 01:26     .Critical Care  Performed by: Hoy Nidia FALCON, PA-C Authorized by: Hoy Nidia FALCON, PA-C   Critical care provider statement:    Critical care time (minutes):  30   Critical care was necessary to treat or prevent imminent or life-threatening deterioration of the following conditions: Intractable nausea and vomiting.   Critical care was time spent personally by me on the following activities:  Development of treatment plan with patient or surrogate, discussions with consultants, evaluation of patient's response to treatment, examination of patient, ordering and review of laboratory studies, ordering and review of radiographic studies, ordering and performing treatments and interventions, pulse oximetry, re-evaluation of patient's condition and review of old charts   Care discussed with: admitting provider      Medications Ordered in the ED  ondansetron  (ZOFRAN -ODT) disintegrating tablet 4 mg (4 mg Oral Given 08/26/23 0830)  droperidol  (INAPSINE ) 2.5 MG/ML injection 1.25 mg (1.25 mg Intravenous Given 08/26/23 1109)  lactated ringers  bolus 1,000 mL (0 mLs Intravenous Stopped 08/26/23 1324)  pantoprazole  (PROTONIX ) injection 40 mg (40 mg Intravenous Given 08/26/23 1108)  ondansetron  (ZOFRAN ) injection 4 mg (4 mg Intravenous Given 08/26/23 1415)                                    Medical Decision Making Amount and/or Complexity of Data Reviewed Labs: ordered. Radiology: ordered.  Risk Prescription drug management.    This patient presents to  the ED for concern of abdominal pain, this involves an extensive number of treatment options, and is a complaint that carries with it a high risk of complications and morbidity.  The differential diagnosis includes gastroenteritis, colitis, small bowel obstruction, appendicitis, cholecystitis, pancreatitis, nephrolithiasis, UTI, pyleonephritis   Co morbidities that complicate the patient evaluation  cannabinoid hyperemesis syndrome, anxiety, HLD, migraines   Additional history obtained:  08/25/2023 CT scan: No acute intra-abdominal or intrapelvic abnormality.  Hepatomegaly.   Problem List / ED Course / Critical interventions / Medication management  Patient presented for abdominal pain, nausea and vomiting.  Possibly due to hyperemesis cannabinoid vs recent increased dose of Zepbound .  This is patient's third time presenting to the ED in the past 1 week.  Patient had reassuring CT abd/pelvis  yesterday.  Physical exam with RUQ tenderness to palpation.  Of note, patient also concern for chest pain.  This chest pain was reproducible to palpation. I Ordered, and personally interpreted labs.  hCG negative.  Troponin within normal limits.  CBC without leukocytosis or anemia.  CMP with mild hypokalemia at 3.4.  Lipase within normal limits.  UA pending. I ordered imaging studies including RUQ US  and chest x-ray. I independently visualized and interpreted imaging and I agree with the radiologist interpretation of no acute process. Provided patient with droperidol  and IV fluids along with Protonix .  Patient felt better for a little while, but the nausea and vomiting started again.  Will admit patient for intractable nausea and vomiting. I requested consultation with the internal medicine provider on-call Dr. Kristy,  and discussed lab and imaging findings as well as pertinent plan - they agree to admit patient. I have reviewed the patients home medicines and have made adjustments as needed   Social  Determinants of Health:  none       Final diagnoses:  Nausea and vomiting, unspecified vomiting type    ED Discharge Orders     None          Hoy Nidia FALCON, [place] 08/26/23 1503    Levander Houston, MD 08/27/23 [phone]

## 2023-08-26 NOTE — Telephone Encounter (Signed)
 FYI Only or Action Required?: FYI only for provider.  Patient was last seen in primary care on 08/16/2023 by Elicia Sharper, DO. Called Nurse Triage reporting Vomiting. Symptoms began several days ago. Interventions attempted: Rest, hydration, or home remedies. Symptoms are: gradually worsening.  Triage Disposition: Go to ED Now  Patient/caregiver understands and will follow disposition?: Caller states that pt has been having syncopal episodes and vomiting blood. Pts last episode of syncope was a few minutes ago.  RN advised that pt needs to go to ED, caller agreeable.   Copied from CRM [id number]. Topic: Clinical - Red Word Triage >> Aug 26, 2023  7:50 AM Diannia H wrote: Red Word that prompted transfer to Nurse Triage: Vomiting up blood been to the er twice on 07/04 and 07/06. The ER told the patient she was dehydrated and this is a side effect of the new weight lose medicine Zepbound . Patient has had passing out spells

## 2023-08-26 NOTE — ED Notes (Signed)
Informed RN of heart monitor alerts. ?

## 2023-08-26 NOTE — ED Notes (Signed)
 2 unsuccessful attempts made for IV access

## 2023-08-26 NOTE — H&P (Signed)
 Date: 08/26/2023               Patient Name:  Kristin Wright MRN: [id number]  DOB: 1985/05/18 Age / Sex: 38 y.o., female   PCP: Amilibia, Jaden, DO         Medical Service: Internal Medicine Teaching Service         Attending Physician: Dr. MICAEL Riis Winfrey      First Contact: Schuyler Novak, DO    Second Contact: Dr. Hadassah Kristy Ahr, MD          Pager Information: First Contact Pager: [phone]   Second Contact Pager: [phone]   SUBJECTIVE   Chief Complaint: nausea and vomiting  History of Present Illness: Kristin Wright is a 38 y.o. female with PMH of cannabinoid hyperemesis syndrome, GERD, Bipolar disorder on medications, BMI 51, OSA presenting to the ED  for second time for intractable nausea  and  vomiting  Ms. Stetzer has intractable nausea and vomiting at baseline. She has persistent nausea and about 1 emesis episode per week. She admits to smoking marijuana and been told that quitting is the solution. She does not take medications for this at home.   She was at her usual state until Monday, 6/30 when she increased the dose of Zepbound  to 5 mg. Since, patients endorses 3-4 emesis episodes, persistent nausea, and 1-2 daily dark loose stool episodes. She received PO antiemetics (Zofran  and phernergan) in the ED on 7/4. She was not able to keep these medications down, thus no relief.  Since then, patient was experiencing throat pain ( a potato in my throat) and epigastric pain. Denies abdominal pain, chest pain. She has had some dry heaving. However, she says she is chronically short of breath as she needs BiPAP at night for OSA but she does not like the mask. There is also some blood tinged sputum with her vomitus, less than a teaspoon.   Since arriving today, she has had more relief with the droperidol  and zofran  IV.   ED Course: Labs significant for K 3.4, CO2 21, Anion gap 16 Imaging RUQ US  unremarkable Received droperidol , zofran , phenergan     Current Meds   Medication Sig   ARIPiprazole  (ABILIFY ) 5 MG tablet Take 1 tablet (5 mg total) by mouth daily.   ondansetron  (ZOFRAN -ODT) 4 MG disintegrating tablet Take 1 tablet (4 mg total) by mouth every 8 (eight) hours as needed for up to 12 doses for nausea or vomiting.   OXcarbazepine  (TRILEPTAL ) 150 MG tablet Take 1 tablet (150 mg total) by mouth 2 (two) times daily.   promethazine  (PHENERGAN ) 25 MG tablet Take 1 tablet (25 mg total) by mouth every 8 (eight) hours as needed for up to 6 doses for vomiting or nausea.   tirzepatide  5 MG/0.5ML injection vial Inject 5 mg into the skin every Monday.   Vitamin D , Ergocalciferol , (DRISDOL ) 1.25 MG (50000 UNIT) CAPS capsule Take 1 capsule (50,000 Units total) by mouth every 7 (seven) days. TAKE IT ONLY ONCE A WEEK (Patient taking differently: Take 50,000 Units by mouth every Monday.)    Past Medical History Bipolar disorder Cannabinoid hyperemesis syndrome GERD  Past Surgical History Past Surgical History:  Procedure Laterality Date   FASCIOTOMY Left 08/27/2019   Procedure: left leg anterior/lateral compartmental release;  Surgeon: Addie Cordella Hamilton, MD;  Location: [facility] OR;  Service: Orthopedics;  Laterality: Left;     Social:  Lives With: Alone Occupation: Works at group home Support: Mother Level of Function: independent PCP:  Amilibia, Jaden, DO  Substances: -Tobacco: 3-4 cigarettes per day -Alcohol: socially  -Recreational Drug: marijuana  Family History:  Family History  Problem Relation Age of Onset   Hypertension Mother    Diabetes Mother    Hyperlipidemia Mother    Stroke Mother    Other Mother    Heart disease Mother    Asthma Father    Heart disease Maternal Grandmother    Breast cancer Maternal Grandmother    Cervical cancer Maternal Grandmother    Hypertension Maternal Grandmother    Diabetes Maternal Grandmother    Diabetes Maternal Grandfather    Heart disease Maternal Grandfather    Hyperlipidemia Maternal Grandfather       Allergies: Allergies as of 08/26/2023 - Review Complete 08/26/2023  Allergen Reaction Noted   Asa [aspirin] Anaphylaxis 06/07/2012   Milk-related compounds Nausea And Vomiting 08/23/2020   Penicillins Other (See Comments) 06/07/2012   Soy allergy (obsolete) Other (See Comments) 08/23/2020   Yeast-derived drug products Other (See Comments) 08/23/2020    Review of Systems: A complete ROS was negative except as per HPI.   OBJECTIVE:   Physical Exam: Blood pressure (!) 137/59, pulse (!) 45, temperature 98.4 F (36.9 C), temperature source Oral, resp. rate 18, last menstrual period 08/09/2023, SpO2 96%.  Constitutional: well-appearing female, in no acute distress HENT: normocephalic atraumatic, mucous membranes moist Eyes: conjunctiva non-erythematous Cardiovascular: RRR, no MRG Pulmonary/Chest: normal work of breathing on room air, LCTAB with decreased breath sounds at the bases.  Abdominal: soft, NTND MSK: normal bulk and tone Skin: warm and dry   Labs: CBC    Component Value Date/Time   WBC 8.7 08/26/2023 0835   RBC 4.23 08/26/2023 0835   HGB 14.1 08/26/2023 0835   HGB 12.1 07/03/2017 0938   HCT 41.1 08/26/2023 0835   HCT 38.2 07/03/2017 0938   PLT 422 (H) 08/26/2023 0835   PLT 418 (H) 07/03/2017 0938   MCV 97.2 08/26/2023 0835   MCV 96 07/03/2017 0938   MCV 98 09/03/2011 1837   MCH 33.3 08/26/2023 0835   MCHC 34.3 08/26/2023 0835   RDW 12.5 08/26/2023 0835   RDW 14.5 07/03/2017 0938   RDW 13.8 09/03/2011 1837   LYMPHSABS 2.7 08/25/2023 0024   MONOABS 0.4 08/25/2023 0024   EOSABS 0.3 08/25/2023 0024   BASOSABS 0.0 08/25/2023 0024     CMP     Component Value Date/Time   NA 136 08/26/2023 0835   NA 143 08/16/2023 1012   NA 141 09/03/2011 1837   K 3.4 (L) 08/26/2023 0835   K 3.7 09/03/2011 1837   CL 99 08/26/2023 0835   CL 107 09/03/2011 1837   CO2 21 (L) 08/26/2023 0835   CO2 25 09/03/2011 1837   GLUCOSE 109 (H) 08/26/2023 0835   GLUCOSE 72  09/03/2011 1837   BUN 6 08/26/2023 0835   BUN 10 08/16/2023 1012   BUN 6 (L) 09/03/2011 1837   CREATININE 0.99 08/26/2023 0835   CREATININE 0.90 09/03/2011 1837   CALCIUM 9.0 08/26/2023 0835   CALCIUM 9.0 09/03/2011 1837   PROT 7.3 08/26/2023 0835   PROT 6.9 07/03/2017 0938   PROT 7.8 09/03/2011 1837   ALBUMIN 3.7 08/26/2023 0835   ALBUMIN 4.0 07/03/2017 0938   ALBUMIN 3.8 09/03/2011 1837   AST 20 08/26/2023 0835   AST 26 09/03/2011 1837   ALT 17 08/26/2023 0835   ALT 20 09/03/2011 1837   ALKPHOS 44 08/26/2023 0835   ALKPHOS 67 09/03/2011 1837  BILITOT 0.7 08/26/2023 0835   BILITOT <0.2 07/03/2017 0938   BILITOT 0.5 09/03/2011 1837   GFRNONAA >60 08/26/2023 0835   GFRNONAA >60 09/03/2011 1837   GFRAA >60 08/19/2019 0841   GFRAA >60 09/03/2011 1837    Imaging:  DG Chest 2 View Result Date: 08/26/2023 CLINICAL DATA:  Chest pain. EXAM: CHEST - 2 VIEW COMPARISON:  08/23/2023. FINDINGS: Stable cardiomediastinal silhouette. No focal consolidation, pleural effusion, or pneumothorax. No acute osseous abnormality. IMPRESSION: No acute cardiopulmonary findings. Electronically Signed   By: Harrietta Sherry M.D.   On: 08/26/2023 13:03   US  Abdomen Limited RUQ (LIVER/GB) Result Date: 08/26/2023 CLINICAL DATA:  Right upper quadrant abdominal pain. EXAM: ULTRASOUND ABDOMEN LIMITED RIGHT UPPER QUADRANT COMPARISON:  Ultrasound dated 08/16/2007. FINDINGS: Gallbladder: No gallstones or wall thickening visualized. No sonographic Murphy sign noted by sonographer. Common bile duct: Diameter: 4 mm Liver: No focal lesion identified. Within normal limits in parenchymal echogenicity. Portal vein is patent on color Doppler imaging with normal direction of blood flow towards the liver. Other: None. IMPRESSION: Unremarkable right upper quadrant ultrasound. Electronically Signed   By: Vanetta Chou M.D.   On: 08/26/2023 10:23   CT ABDOMEN PELVIS W CONTRAST Result Date: 08/25/2023 CLINICAL DATA:  Abdominal  pain, acute, nonlocalized EXAM: CT ABDOMEN AND PELVIS WITH CONTRAST TECHNIQUE: Multidetector CT imaging of the abdomen and pelvis was performed using the standard protocol following bolus administration of intravenous contrast. RADIATION DOSE REDUCTION: This exam was performed according to the departmental dose-optimization program which includes automated exposure control, adjustment of the mA and/or kV according to patient size and/or use of iterative reconstruction technique. CONTRAST:  OMNIPAQUE  IOHEXOL  350 MG/ML SOLN COMPARISON:  CT abdomen pelvis 06/07/2012 FINDINGS: Lower chest: No acute abnormality. Hepatobiliary: Liver is enlarged measuring up to 22 cm. No focal liver abnormality. No gallstones, gallbladder wall thickening, or pericholecystic fluid. No biliary dilatation. Pancreas: No focal lesion. Normal pancreatic contour. No surrounding inflammatory changes. No main pancreatic ductal dilatation. Spleen: Normal in size without focal abnormality. Adrenals/Urinary Tract: No adrenal nodule bilaterally. Bilateral kidneys enhance symmetrically. Subcentimeter hypodensities are too small to characterize-no further follow-up indicated. No hydronephrosis. No hydroureter. No nephroureterolithiasis bilaterally. The urinary bladder is unremarkable. Stomach/Bowel: Stomach is within normal limits. No evidence of bowel wall thickening or dilatation. Appendix appears normal. Vascular/Lymphatic: No abdominal aorta or iliac aneurysm. No abdominal, pelvic, or inguinal lymphadenopathy. Reproductive: Uterus and bilateral adnexa are unremarkable. Other: No intraperitoneal free fluid. No intraperitoneal free gas. No organized fluid collection. Musculoskeletal: No abdominal wall hernia or abnormality. No suspicious lytic or blastic osseous lesions. No acute displaced fracture. IMPRESSION: 1. No acute intra-abdominal or intrapelvic abnormality. 2. Hepatomegaly. Electronically Signed   By: Morgane  Naveau M.D.   On:  08/25/2023 01:26     EKG: personally reviewed my interpretation is NSR.   ASSESSMENT & PLAN:   Assessment & Plan by Problem: Principal Problem:   Intractable nausea and vomiting Active Problems:   OSA (obstructive sleep apnea)   Tobacco use   Cannabinoid hyperemesis syndrome   Bipolar disorder (HCC)   Kristin Wright is a 38 y.o. person living with a history of cannabinoid hyperemesis syndrome, anxiety, HLD, migraines, and bipolar disorder who presented with intractable nausea and vomiting was admitted. Now on hospital day 0  Intractable N/V Hx of cannabinoid hyperemesis syndrome RUQ pain GLP-1 agonist use Hematemesis Globus Sensation 38yo female with long history of cannabinoid hyperemesis syndrome. She also increased her GLP-1 to 5mg  last Monday 6/30. Since then  she has experienced intractable n/v with episodes of hematemesis since Thursday (7/3). Suspect this is likely a combination of both. Hematemesis described as minor with small amounts of bright red blood in her saliva. This is likely due to intractable emesis with possible mallory weiss tear. If continues or worsens, will consider a consult to GI.  -Pt did not have relief with zofran  or phenergan . Will order scopolamine  patch at this time. Will consider use of dopamine antagonist if nausea persists due to hx cannabinoid hyperemesis syndrome. She did receive droperidol  in the ED with symptoms improvement.  -RUQ ultrasound unremarkable -Will initiate PPI -Carafate  for globus sensation  Melanotic Stool Pt describes episodes of diarrhea over the weekend that were darker than usual with a pungent smell. The last episode was yesterday, 7/6. Will obtain fecal hemoccult. Continue lovenox  tonight, if hemoccult positive, will d/c. Will continue to follow CBC  Bipolar disorder  Pt on abilify  and trileptal . Will continue these inpatient.   Obstructive sleep apnea Pt with history of sleep apnea requiring a CPAP at night. Pt  nonadherent to CPAP due to feeling strangled. She does endorse SOB at times. CXR unremarkable. Will order CPAP at night while inpatient and encourage compliance.   AGMA Electrolyte disturbances Ketones present in the urine today. Serum bicarb 21 and K 3.4 today. Likely a result of frequent diarrhea and starvation ketosis. Replete potassium today. Will order LR bolus for fluid resuscitation and monitory BMP.   Tobacco Use Pt endorses smoking 2-4 cigarettes per day. Will monitor and order nicotine  patch if necessary.  Best practice: Diet: NPO VTE: Enoxaparin  IVF: LR,Bolus Code: Full  Disposition planning: Prior to Admission Living Arrangement: Home, living alone Anticipated Discharge Location: Home  Dispo: Admit patient to Observation with expected length of stay less than 2 midnights.  Signed: Myrna Bitters, DO Internal Medicine Resident  08/26/2023, 6:07 PM  Please contact IM Residency On-Call Pager at: [phone] or [phone].

## 2023-08-27 ENCOUNTER — Encounter (HOSPITAL_COMMUNITY): Payer: Self-pay | Admitting: Internal Medicine

## 2023-08-27 DIAGNOSIS — G4733 Obstructive sleep apnea (adult) (pediatric): Secondary | ICD-10-CM | POA: Diagnosis present

## 2023-08-27 DIAGNOSIS — Z833 Family history of diabetes mellitus: Secondary | ICD-10-CM | POA: Diagnosis not present

## 2023-08-27 DIAGNOSIS — Z803 Family history of malignant neoplasm of breast: Secondary | ICD-10-CM | POA: Diagnosis not present

## 2023-08-27 DIAGNOSIS — R09A2 Foreign body sensation, throat: Secondary | ICD-10-CM | POA: Diagnosis present

## 2023-08-27 DIAGNOSIS — Z6841 Body Mass Index (BMI) 40.0 and over, adult: Secondary | ICD-10-CM | POA: Diagnosis not present

## 2023-08-27 DIAGNOSIS — E872 Acidosis, unspecified: Secondary | ICD-10-CM | POA: Diagnosis present

## 2023-08-27 DIAGNOSIS — E861 Hypovolemia: Secondary | ICD-10-CM | POA: Diagnosis present

## 2023-08-27 DIAGNOSIS — R042 Hemoptysis: Secondary | ICD-10-CM | POA: Diagnosis present

## 2023-08-27 DIAGNOSIS — G43909 Migraine, unspecified, not intractable, without status migrainosus: Secondary | ICD-10-CM | POA: Diagnosis present

## 2023-08-27 DIAGNOSIS — Z825 Family history of asthma and other chronic lower respiratory diseases: Secondary | ICD-10-CM | POA: Diagnosis not present

## 2023-08-27 DIAGNOSIS — R197 Diarrhea, unspecified: Secondary | ICD-10-CM | POA: Diagnosis present

## 2023-08-27 DIAGNOSIS — F419 Anxiety disorder, unspecified: Secondary | ICD-10-CM | POA: Diagnosis present

## 2023-08-27 DIAGNOSIS — E66813 Obesity, class 3: Secondary | ICD-10-CM | POA: Diagnosis present

## 2023-08-27 DIAGNOSIS — R0789 Other chest pain: Secondary | ICD-10-CM | POA: Diagnosis present

## 2023-08-27 DIAGNOSIS — K219 Gastro-esophageal reflux disease without esophagitis: Secondary | ICD-10-CM | POA: Diagnosis present

## 2023-08-27 DIAGNOSIS — F1290 Cannabis use, unspecified, uncomplicated: Secondary | ICD-10-CM | POA: Diagnosis present

## 2023-08-27 DIAGNOSIS — F17210 Nicotine dependence, cigarettes, uncomplicated: Secondary | ICD-10-CM | POA: Diagnosis present

## 2023-08-27 DIAGNOSIS — R112 Nausea with vomiting, unspecified: Secondary | ICD-10-CM | POA: Diagnosis present

## 2023-08-27 DIAGNOSIS — K920 Hematemesis: Secondary | ICD-10-CM | POA: Diagnosis present

## 2023-08-27 DIAGNOSIS — Z8249 Family history of ischemic heart disease and other diseases of the circulatory system: Secondary | ICD-10-CM | POA: Diagnosis not present

## 2023-08-27 DIAGNOSIS — E876 Hypokalemia: Secondary | ICD-10-CM | POA: Diagnosis present

## 2023-08-27 DIAGNOSIS — Z8049 Family history of malignant neoplasm of other genital organs: Secondary | ICD-10-CM | POA: Diagnosis not present

## 2023-08-27 DIAGNOSIS — E785 Hyperlipidemia, unspecified: Secondary | ICD-10-CM | POA: Diagnosis present

## 2023-08-27 DIAGNOSIS — F319 Bipolar disorder, unspecified: Secondary | ICD-10-CM | POA: Diagnosis present

## 2023-08-27 DIAGNOSIS — Z823 Family history of stroke: Secondary | ICD-10-CM | POA: Diagnosis not present

## 2023-08-27 LAB — CBC
HCT: 38.4 % (ref 36.0–46.0)
Hemoglobin: 12.9 g/dL (ref 12.0–15.0)
MCH: 32.6 pg (ref 26.0–34.0)
MCHC: 33.6 g/dL (ref 30.0–36.0)
MCV: 97 fL (ref 80.0–100.0)
Platelets: 351 K/uL (ref 150–400)
RBC: 3.96 MIL/uL (ref 3.87–5.11)
RDW: 12.3 % (ref 11.5–15.5)
WBC: 9.5 K/uL (ref 4.0–10.5)
nRBC: 0 % (ref 0.0–0.2)

## 2023-08-27 LAB — COMPREHENSIVE METABOLIC PANEL WITH GFR
ALT: 14 U/L (ref 0–44)
AST: 18 U/L (ref 15–41)
Albumin: 3.4 g/dL — ABNORMAL LOW (ref 3.5–5.0)
Alkaline Phosphatase: 44 U/L (ref 38–126)
Anion gap: 10 (ref 5–15)
BUN: 6 mg/dL (ref 6–20)
CO2: 24 mmol/L (ref 22–32)
Calcium: 8.6 mg/dL — ABNORMAL LOW (ref 8.9–10.3)
Chloride: 102 mmol/L (ref 98–111)
Creatinine, Ser: 0.92 mg/dL (ref 0.44–1.00)
GFR, Estimated: >60 mL/min (ref >60)
Glucose, Bld: 97 mg/dL (ref 70–99)
Potassium: 3.2 mmol/L — ABNORMAL LOW (ref 3.5–5.1)
Sodium: 136 mmol/L (ref 135–145)
Total Bilirubin: 0.9 mg/dL (ref 0.0–1.2)
Total Protein: 6.6 g/dL (ref 6.5–8.1)

## 2023-08-27 LAB — MAGNESIUM: Magnesium: 1.9 mg/dL (ref 1.7–2.4)

## 2023-08-27 MED ORDER — SUCRALFATE 1 GM/10ML PO SUSP
1.0000 g | Freq: Once | ORAL | Status: AC
  Administered 2023-08-27: 1 g via ORAL
  Filled 2023-08-27: qty 10

## 2023-08-27 MED ORDER — POTASSIUM CHLORIDE CRYS ER 20 MEQ PO TBCR
40.0000 meq | EXTENDED_RELEASE_TABLET | Freq: Once | ORAL | Status: DC
Start: 2023-08-27 — End: 2023-08-27

## 2023-08-27 MED ORDER — SODIUM CHLORIDE 0.9 % IV SOLN: INTRAVENOUS | Status: AC

## 2023-08-27 MED ORDER — POTASSIUM CHLORIDE IN NACL 40-0.9 MEQ/L-% IV SOLN
INTRAVENOUS | Status: DC
  Filled 2023-08-27: qty 1000

## 2023-08-27 MED ORDER — CAPSAICIN 0.075 % EX CREA
TOPICAL_CREAM | Freq: Three times a day (TID) | CUTANEOUS | Status: DC
  Filled 2023-08-27 (×3): qty 57

## 2023-08-27 MED ORDER — POTASSIUM CHLORIDE IN NACL 40-0.9 MEQ/L-% IV SOLN
INTRAVENOUS | Status: AC
  Filled 2023-08-27: qty 1000

## 2023-08-27 MED ORDER — PHENOL 1.4 % MT LIQD
1.0000 | OROMUCOSAL | Status: DC | PRN
  Administered 2023-08-27: 1 via OROMUCOSAL
  Filled 2023-08-27: qty 177

## 2023-08-27 MED ORDER — PROCHLORPERAZINE EDISYLATE 10 MG/2ML IJ SOLN
10.0000 mg | Freq: Once | INTRAMUSCULAR | Status: AC
  Administered 2023-08-27: 10 mg via INTRAVENOUS
  Filled 2023-08-27: qty 2

## 2023-08-27 MED ORDER — ACETAMINOPHEN 325 MG PO TABS
650.0000 mg | ORAL_TABLET | Freq: Once | ORAL | Status: AC
  Administered 2023-08-27: 650 mg via ORAL
  Filled 2023-08-27: qty 2

## 2023-08-27 MED ORDER — METOCLOPRAMIDE HCL 5 MG/ML IJ SOLN
10.0000 mg | Freq: Four times a day (QID) | INTRAMUSCULAR | Status: DC
  Administered 2023-08-27 – 2023-08-29 (×8): 10 mg via INTRAVENOUS
  Filled 2023-08-27 (×8): qty 2

## 2023-08-27 MED ORDER — PANTOPRAZOLE SODIUM 40 MG IV SOLR
40.0000 mg | Freq: Two times a day (BID) | INTRAVENOUS | Status: DC
  Administered 2023-08-27 – 2023-08-29 (×4): 40 mg via INTRAVENOUS
  Filled 2023-08-27 (×4): qty 10

## 2023-08-27 NOTE — Plan of Care (Signed)

## 2023-08-27 NOTE — Progress Notes (Signed)
 HD#0 SUBJECTIVE:  Patient Summary: Kristin Wright is a 38 y.o. with a pertinent PMH of cannabinoid hyperemesis syndrome, GERD, Bipolar disorder, and OSA who presented with intractable n/v and admitted for intractable nausea and vomiting, volume depletion, and decreased oral intake.   Overnight Events: Overnight pt continued to experience nausea, compazine  was given without relief.  Interim History: Pt seen and examined at the bedside this morning. She continued to express that she is unable to tolerate PO intake and is vomiting even with sips of water. She has tried zofran , phenergan , and compazine  without relief. She did appear uncomfortable and is also describing an epigastric abdominal pain that is worse after vomiting. She denies hematemesis or diarrhea since admission.   OBJECTIVE:  Vital Signs: Vitals:   08/26/23 2136 08/27/23 0040 08/27/23 0428 08/27/23 0856  BP:  (!) 162/97 112/71 (!) 146/84  Pulse:  (!) 52 (!) 51 (!) 49  Resp:  18 18 20   Temp:  98.1 F (36.7 C) 98.5 F (36.9 C) 97.9 F (36.6 C)  TempSrc:  Oral Oral Oral  SpO2:  100% 97% 100%  Weight: (!) 144.7 kg     Height: 5' 8 (1.727 m)      Supplemental O2: Room Air SpO2: 100 %  Filed Weights   08/26/23 2136  Weight: (!) 144.7 kg    No intake or output data in the 24 hours ending 08/27/23 1125 Net IO Since Admission: No IO data has been entered for this period [08/27/23 1125]  Physical Exam: Physical Exam  Patient Lines/Drains/Airways Status     Active Line/Drains/Airways     Name Placement date Placement time Site Days   Peripheral IV 08/26/23 20 G Posterior;Right Hand 08/26/23  1105  Hand  1            Pertinent labs and imaging:      Latest Ref Rng & Units 08/27/2023    4:32 AM 08/26/2023    8:35 AM 08/25/2023   12:24 AM  CBC  WBC 4.0 - 10.5 K/uL 9.5  8.7  7.6   Hemoglobin 12.0 - 15.0 g/dL 87.0  85.8  86.4   Hematocrit 36.0 - 46.0 % 38.4  41.1  38.6   Platelets 150 - 400 K/uL 351  422   353        Latest Ref Rng & Units 08/27/2023    4:32 AM 08/26/2023    8:35 AM 08/25/2023   12:24 AM  CMP  Glucose 70 - 99 mg/dL 97  890  888   BUN 6 - 20 mg/dL 6  6  11    Creatinine 0.44 - 1.00 mg/dL 9.07  9.00  8.92   Sodium 135 - 145 mmol/L 136  136  140   Potassium 3.5 - 5.1 mmol/L 3.2  3.4  3.5   Chloride 98 - 111 mmol/L 102  99  106   CO2 22 - 32 mmol/L 24  21  20    Calcium 8.9 - 10.3 mg/dL 8.6  9.0  9.0   Total Protein 6.5 - 8.1 g/dL 6.6  7.3  6.8   Total Bilirubin 0.0 - 1.2 mg/dL 0.9  0.7  0.4   Alkaline Phos 38 - 126 U/L 44  44  43   AST 15 - 41 U/L 18  20  21    ALT 0 - 44 U/L 14  17  17      DG Chest 2 View Result Date: 08/26/2023 CLINICAL DATA:  Chest pain. EXAM: CHEST -  2 VIEW COMPARISON:  08/23/2023. FINDINGS: Stable cardiomediastinal silhouette. No focal consolidation, pleural effusion, or pneumothorax. No acute osseous abnormality. IMPRESSION: No acute cardiopulmonary findings. Electronically Signed   By: Harrietta Sherry M.D.   On: 08/26/2023 13:03    ASSESSMENT/PLAN:  Assessment: Principal Problem:   Intractable nausea and vomiting Active Problems:   OSA (obstructive sleep apnea)   Tobacco use   Cannabinoid hyperemesis syndrome   Bipolar disorder (HCC)   Plan: #Intractable N/V Hx of cannabinoid hyperemesis syndrome RUQ pain GLP-1 agonist use Hematemesis Globus Sensation Pt has a longstanding history with cannabinoid hyperemesis. Uncertain of timeline or quantification of marijuana use. She does have a history of multiple ED visits due to cyclic vomiting. No symptomatic improvement with zofran , phenergan , or compazine . After discussion with pharmacy, will try capsaicin  cream on the abdomen TID for relief of cannabinoid hyperemesis symptoms. Other treatments include DA antagonists including droperidol  and haloperidol, however will hold off on these at this time. Will add reglan  for additional nausea relief.  -Will order EKG due to use of multiple qtc prolonging  medications.   #AGMA Electrolyte disturbances AGMA resolved today. Like due to hypovolemia 2/2 to emesis and decreased PO  intake. K 3.2 today and pt still dry on exam. Will begin maintenance fluids with 1L NS Kcl and continue NS afterwards until pt is able to tolerate PO intake.   #OSA Pt with OSA and CPAP noncompliance. CPAP ordered for at night, however pt continues to decline use.   #Bipolar Continue home meds of abilify , trileptal . Will obtain EKG for qtc.   #Tobacco Use Pt smokes 3-4 cigarettes per day. Will monitor and or nicotine  pt if needed.   Best Practice: Diet: Clear liquid diet, advance as tolerated IVF: Fluids: 0.9NS, Rate: 100 cc/hr x 10 hrs VTE: SCDs Start: 08/26/23 1614 Code: Full  Disposition planning: Therapy Recs: None, DME: none Family Contact: Chrystie Hagwood (mother), to be notified. DISPO: Anticipated discharge pending to Home pending Clinical improvement.  Signature:  Schuyler Novak, DO, PGY-1 [name]  11:25 AM, 08/27/2023  On Call pager [phone]

## 2023-08-27 NOTE — Progress Notes (Signed)
 Patient refused CPAP for the night

## 2023-08-27 NOTE — Plan of Care (Signed)

## 2023-08-28 ENCOUNTER — Other Ambulatory Visit (HOSPITAL_COMMUNITY): Payer: Self-pay

## 2023-08-28 ENCOUNTER — Telehealth (HOSPITAL_COMMUNITY): Payer: Self-pay | Admitting: Pharmacy Technician

## 2023-08-28 DIAGNOSIS — R112 Nausea with vomiting, unspecified: Secondary | ICD-10-CM

## 2023-08-28 LAB — GASTROINTESTINAL PANEL BY PCR, STOOL (REPLACES STOOL CULTURE)

## 2023-08-28 LAB — BASIC METABOLIC PANEL WITH GFR
Anion gap: 9 (ref 5–15)
Anion gap: 10 (ref 5–15)
BUN: 5 mg/dL — ABNORMAL LOW (ref 6–20)
BUN: <5 mg/dL — ABNORMAL LOW (ref 6–20)
CO2: 23 mmol/L (ref 22–32)
CO2: 25 mmol/L (ref 22–32)
Calcium: 8 mg/dL — ABNORMAL LOW (ref 8.9–10.3)
Calcium: 8.5 mg/dL — ABNORMAL LOW (ref 8.9–10.3)
Chloride: 101 mmol/L (ref 98–111)
Chloride: 101 mmol/L (ref 98–111)
Creatinine, Ser: 0.88 mg/dL (ref 0.44–1.00)
Creatinine, Ser: 0.89 mg/dL (ref 0.44–1.00)
GFR, Estimated: >60 mL/min (ref >60)
GFR, Estimated: >60 mL/min (ref >60)
Glucose, Bld: 87 mg/dL (ref 70–99)
Glucose, Bld: 84 mg/dL (ref 70–99)
Potassium: 3.2 mmol/L — ABNORMAL LOW (ref 3.5–5.1)
Potassium: 3.4 mmol/L — ABNORMAL LOW (ref 3.5–5.1)
Sodium: 133 mmol/L — ABNORMAL LOW (ref 135–145)
Sodium: 136 mmol/L (ref 135–145)

## 2023-08-28 LAB — CBC
HCT: 37.8 % (ref 36.0–46.0)
Hemoglobin: 12.7 g/dL (ref 12.0–15.0)
MCH: 33 pg (ref 26.0–34.0)
MCHC: 33.6 g/dL (ref 30.0–36.0)
MCV: 98.2 fL (ref 80.0–100.0)
Platelets: 346 K/uL (ref 150–400)
RBC: 3.85 MIL/uL — ABNORMAL LOW (ref 3.87–5.11)
RDW: 12.3 % (ref 11.5–15.5)
WBC: 9.5 K/uL (ref 4.0–10.5)
nRBC: 0 % (ref 0.0–0.2)

## 2023-08-28 MED ORDER — POTASSIUM CHLORIDE 10 MEQ/100ML IV SOLN
10.0000 meq | INTRAVENOUS | Status: DC
  Administered 2023-08-28: 10 meq via INTRAVENOUS
  Filled 2023-08-28: qty 100

## 2023-08-28 MED ORDER — SODIUM CHLORIDE 0.9 % IV SOLN
INTRAVENOUS | Status: DC
  Filled 2023-08-28 (×7): qty 1000

## 2023-08-28 MED ORDER — POTASSIUM CHLORIDE IN NACL 40-0.9 MEQ/L-% IV SOLN
250.0000 mL/h | INTRAVENOUS | Status: AC
  Administered 2023-08-29: 250 mL/h via INTRAVENOUS
  Filled 2023-08-28 (×2): qty 1000

## 2023-08-28 NOTE — Telephone Encounter (Signed)
 Patient Product/process development scientist completed.    The patient is insured through [facility] [place].     Ran test claim for capsicum (Zostrix) 0.075% and Product Not covered    This test claim was processed through [employer]- copay amounts may vary at other pharmacies due to [employer], or as the patient moves through the different stages of their insurance plan.     Reyes Sharps, CPHT Pharmacy Technician III Certified Patient Advocate [facility] Pharmacy Patient Advocate Team Direct Number: [phone]  Fax: [phone]

## 2023-08-28 NOTE — Progress Notes (Signed)
 Transition of Care [facility]) - Inpatient Brief Assessment   Patient Details  Name: Kristin Wright MRN: [id number] Date of Birth: 1985-12-12  [facility]) CM/SW Contact:    Rosaline JONELLE Joe, RN Phone Number: 08/28/2023, 4:29 PM   Clinical Narrative: CM noted that patient admitted for intractable nausea/vomiting due to history of cannabis abuse.  Resources provided in the AVS for Outpatient substance abuse and article relating to hyperemesis syndrome due to cannabis use.  No other TOC needs at this time.   Transition of Care Asessment: Insurance and Status: (P) Insurance coverage has been reviewed Patient has primary care physician: (P) Yes Home environment has been reviewed: (P) from home Prior level of function:: (P) Independent Prior/Current Home Services: (P) No current home services Social Drivers of Health Review: (P) SDOH reviewed interventions complete Readmission risk has been reviewed: (P) Yes Transition of care needs: (P) no transition of care needs at this time

## 2023-08-28 NOTE — Plan of Care (Signed)

## 2023-08-28 NOTE — Discharge Instructions (Addendum)
 Thank you for allowing us  to be part of your care. You were hospitalized for intractable nausea and vomiting. We treated you with   See the changes in your medications and management of your chronic conditions below:  *For your Nausea and Vomiting -We have STARTED you on these following medications:  -Reglan  (Metoclopromide) every 6 hours if needed for nausea  -Scopolamine  patch--Change every 3 days  -Capsaicin  Cream--Up to 3 times per day (Wear gloves with application)  -Pantoprazole -- every 12 hours   -Please see the [facility] clinic in 7 to 10 days  FOLLOW UP APPOINTMENTS: We arranged for you to follow up with your family doctor at: The internal medicine center at [address] at 8:45 AM  Please make sure to  Please call your PCP or our clinic if you have any questions or concerns, we may be able to help and keep you from a long and expensive emergency room wait. Our clinic and after hours phone number is [phone]. The best time to call is Monday through Friday 9 am to 4 pm but there is always someone available 24/7 if you have an emergency. If you need medication refills please notify your pharmacy one week in advance and they will send us  a request.   We are glad you are feeling better,  Schuyler Novak, DO Internal Medicine Inpatient Teaching Service at [facility]

## 2023-08-28 NOTE — Progress Notes (Signed)
 HD#1 SUBJECTIVE:  Patient Summary: Kristin Wright is a 38 y.o. with a pertinent PMH of cannabinoid hyperemesis syndrome, GERD, Bipolar disorder, and OSA who presented with intractable n/v and admitted for intractable nausea and vomiting, volume depletion, and decreased oral intake.   Overnight Events: Overnight pt continued to experience nausea, compazine  was given without relief.  Interim History: Pt seen and examined at the bedside this morning. She was much more interactive with the team stating that she was feeling much better today. She is going to attempt oral intake today. We did have a long conversation with the patient and her aunt about her case including cannabinoid hyperemesis and the need to quit smoking. Pt and family were very receptive.  OBJECTIVE:  Vital Signs: Vitals:   08/27/23 2039 08/27/23 2342 08/28/23 0512 08/28/23 0900  BP: 129/78 114/68 (!) 149/89 (!) 144/85  Pulse: 61 (!) 52 (!) 55 66  Resp: 20 20 20 18   Temp: 98.1 F (36.7 C) 98.1 F (36.7 C)  97.6 F (36.4 C)  TempSrc: Oral Oral  Oral  SpO2: 97% 99% 96% 100%  Weight:      Height:       Supplemental O2: Room Air SpO2: 100 %  Filed Weights   08/26/23 2136  Weight: (!) 144.7 kg    No intake or output data in the 24 hours ending 08/28/23 1343 Net IO Since Admission: No IO data has been entered for this period [08/28/23 1343]  Physical Exam: Const: Awake, alert in NAD HENT: Normocephalic, atraumatic, mucus membranes moist Card: RRR, No MRG, No pitting edema on LE's bilaterally  Resp: LCTAB, no increased work of breathing Abd: Soft, NTND, Bsx4 Extremities: Warm, pink   Patient Lines/Drains/Airways Status     Active Line/Drains/Airways     Name Placement date Placement time Site Days   Peripheral IV 08/26/23 20 G Posterior;Right Hand 08/26/23  1105  Hand  1            Pertinent labs and imaging:      Latest Ref Rng & Units 08/28/2023    3:19 AM 08/27/2023    4:32 AM 08/26/2023     8:35 AM  CBC  WBC 4.0 - 10.5 K/uL 9.5  9.5  8.7   Hemoglobin 12.0 - 15.0 g/dL 87.2  87.0  85.8   Hematocrit 36.0 - 46.0 % 37.8  38.4  41.1   Platelets 150 - 400 K/uL 346  351  422        Latest Ref Rng & Units 08/28/2023    3:19 AM 08/27/2023    4:32 AM 08/26/2023    8:35 AM  CMP  Glucose 70 - 99 mg/dL 87  97  890   BUN 6 - 20 mg/dL 5  6  6    Creatinine 0.44 - 1.00 mg/dL 9.11  9.07  9.00   Sodium 135 - 145 mmol/L 133  136  136   Potassium 3.5 - 5.1 mmol/L 3.2  3.2  3.4   Chloride 98 - 111 mmol/L 101  102  99   CO2 22 - 32 mmol/L 23  24  21    Calcium 8.9 - 10.3 mg/dL 8.0  8.6  9.0   Total Protein 6.5 - 8.1 g/dL  6.6  7.3   Total Bilirubin 0.0 - 1.2 mg/dL  0.9  0.7   Alkaline Phos 38 - 126 U/L  44  44   AST 15 - 41 U/L  18  20   ALT  0 - 44 U/L  14  17     No results found.   ASSESSMENT/PLAN:  Assessment: Principal Problem:   Intractable nausea and vomiting Active Problems:   OSA (obstructive sleep apnea)   Tobacco use   Cannabinoid hyperemesis syndrome   Bipolar disorder (HCC)   Nausea and vomiting   Plan: #Intractable N/V Hx of cannabinoid hyperemesis syndrome RUQ pain GLP-1 agonist use Hematemesis Globus Sensation Pt has a longstanding history with cannabinoid hyperemesis. Uncertain of timeline or quantification of marijuana use. She does have a history of multiple ED visits due to cyclic vomiting. No symptomatic improvement with zofran , phenergan , or compazine . -Continue capsaicin  cream on the abdomen TID for relief of cannabinoid hyperemesis symptoms. Other treatments include DA antagonists including droperidol  and haloperidol, however will hold off on these at this time.  -Post emetic abdominal pain improving. -Continue IV Reglan  for additional nausea relief.  -Will advance diet as tolerated  #AGMA Electrolyte disturbances AGMA resolved. Like due to hypovolemia 2/2 to emesis and decreased PO intake. K persistently low today at 3.2. Will replete with  NS/KCL  #OSA Pt with OSA and CPAP noncompliance. CPAP ordered for at night, however pt continues to decline use.   #Bipolar Continue home meds of abilify , trileptal . EKG yesterday without QT prolongation.   #Tobacco Use Pt smokes 3-4 cigarettes per day. Will monitor and add nicotine  patch if needed.   Best Practice: Diet: Clear liquid diet, advance as tolerated IVF: Fluids: 0.9NS, Rate: 100 cc/hr x 10 hrs VTE: SCDs Start: 08/26/23 1614 Code: Full  Disposition planning: Therapy Recs: None, DME: none Family Contact: Gray Maugeri (mother), to be notified. DISPO: Anticipated discharge pending to Home pending Clinical improvement.  Signature:  Schuyler Novak, DO, PGY-1 [name]  1:43 PM, 08/28/2023  On Call pager [phone]

## 2023-08-29 ENCOUNTER — Other Ambulatory Visit (HOSPITAL_COMMUNITY): Payer: Self-pay

## 2023-08-29 LAB — BASIC METABOLIC PANEL WITH GFR
Anion gap: 9 (ref 5–15)
BUN: <5 mg/dL — ABNORMAL LOW (ref 6–20)
CO2: 22 mmol/L (ref 22–32)
Calcium: 8.4 mg/dL — ABNORMAL LOW (ref 8.9–10.3)
Chloride: 105 mmol/L (ref 98–111)
Creatinine, Ser: 0.86 mg/dL (ref 0.44–1.00)
GFR, Estimated: >60 mL/min (ref >60)
Glucose, Bld: 78 mg/dL (ref 70–99)
Potassium: 4 mmol/L (ref 3.5–5.1)
Sodium: 136 mmol/L (ref 135–145)

## 2023-08-29 LAB — CBC
HCT: 39.2 % (ref 36.0–46.0)
Hemoglobin: 13.2 g/dL (ref 12.0–15.0)
MCH: 32.9 pg (ref 26.0–34.0)
MCHC: 33.7 g/dL (ref 30.0–36.0)
MCV: 97.8 fL (ref 80.0–100.0)
Platelets: 331 K/uL (ref 150–400)
RBC: 4.01 MIL/uL (ref 3.87–5.11)
RDW: 12.3 % (ref 11.5–15.5)
WBC: 7.4 K/uL (ref 4.0–10.5)
nRBC: 0 % (ref 0.0–0.2)

## 2023-08-29 MED ORDER — SUCRALFATE 1 GM/10ML PO SUSP
1.0000 g | Freq: Four times a day (QID) | ORAL | 0 refills | Status: AC
  Filled 2023-08-29: qty 400, 10d supply, fill #0

## 2023-08-29 MED ORDER — METOCLOPRAMIDE HCL 10 MG PO TABS
10.0000 mg | ORAL_TABLET | Freq: Four times a day (QID) | ORAL | 0 refills | Status: DC | PRN
  Filled 2023-08-29: qty 40, 10d supply, fill #0

## 2023-08-29 MED ORDER — CAPSAICIN 0.075 % EX CREA
TOPICAL_CREAM | Freq: Three times a day (TID) | CUTANEOUS | 0 refills | Status: DC
  Filled 2023-08-29: qty 28.3, fill #0

## 2023-08-29 MED ORDER — PANTOPRAZOLE SODIUM 40 MG PO TBEC
40.0000 mg | DELAYED_RELEASE_TABLET | Freq: Every day | ORAL | 0 refills | Status: AC
  Filled 2023-08-29: qty 30, 30d supply, fill #0

## 2023-08-29 MED ORDER — PANTOPRAZOLE SODIUM 40 MG PO TBEC
40.0000 mg | DELAYED_RELEASE_TABLET | Freq: Two times a day (BID) | ORAL | Status: DC
  Administered 2023-08-29: 40 mg via ORAL
  Filled 2023-08-29: qty 1

## 2023-08-29 MED ORDER — METOCLOPRAMIDE HCL 5 MG PO TABS
10.0000 mg | ORAL_TABLET | Freq: Four times a day (QID) | ORAL | Status: DC | PRN
  Administered 2023-08-29 (×2): 10 mg via ORAL
  Filled 2023-08-29 (×2): qty 2

## 2023-08-29 MED ORDER — NICOTINE 14 MG/24HR TD PT24
14.0000 mg | MEDICATED_PATCH | Freq: Every day | TRANSDERMAL | Status: DC
  Administered 2023-08-29: 14 mg via TRANSDERMAL
  Filled 2023-08-29: qty 1

## 2023-08-29 MED ORDER — NICOTINE 14 MG/24HR TD PT24
14.0000 mg | MEDICATED_PATCH | Freq: Every day | TRANSDERMAL | 0 refills | Status: AC
  Filled 2023-08-29: qty 28, 28d supply, fill #0

## 2023-08-29 MED ORDER — METOCLOPRAMIDE HCL 10 MG PO TABS
10.0000 mg | ORAL_TABLET | Freq: Four times a day (QID) | ORAL | 0 refills | Status: DC | PRN
  Filled 2023-08-29: qty 20, 5d supply, fill #0

## 2023-08-29 MED ORDER — SCOPOLAMINE 1 MG/3DAYS TD PT72
1.0000 | MEDICATED_PATCH | TRANSDERMAL | Status: DC
  Administered 2023-08-29: 1.5 mg via TRANSDERMAL
  Filled 2023-08-29: qty 1

## 2023-08-29 MED ORDER — SCOPOLAMINE 1 MG/3DAYS TD PT72
1.0000 | MEDICATED_PATCH | TRANSDERMAL | 12 refills | Status: AC
  Filled 2023-08-29: qty 10, 30d supply, fill #0

## 2023-08-29 NOTE — Plan of Care (Signed)

## 2023-08-29 NOTE — Progress Notes (Signed)
 HD#2 SUBJECTIVE:  Patient Summary: Kristin Wright is a 38 y.o. with a pertinent PMH of cannabinoid hyperemesis syndrome, GERD, Bipolar disorder, and OSA who presented with intractable n/v and admitted for intractable nausea and vomiting, volume depletion, and decreased oral intake.   Overnight Events: Overnight pt continued to experience nausea, compazine  was given without relief.  Interim History: Pt seen and examined at the bedside this morning.  She was sitting up right and asking if she could go home. She has not yet tolerated a meal but is tolerating clears.   OBJECTIVE:  Vital Signs: Vitals:   08/28/23 2355 08/29/23 0436 08/29/23 0850 08/29/23 1210  BP: (!) 153/83 (!) 153/87 (!) 155/95 (!) 142/105  Pulse: 74 62 (!) 55 (!) 50  Resp: 20 20 19 18   Temp: 99 F (37.2 C) 98.3 F (36.8 C) 98.4 F (36.9 C) 98 F (36.7 C)  TempSrc:      SpO2: 99% 100% 100% 100%  Weight:      Height:       Supplemental O2: Room Air SpO2: 100 %  Filed Weights   08/26/23 2136  Weight: (!) 144.7 kg    No intake or output data in the 24 hours ending 08/29/23 1505 Net IO Since Admission: No IO data has been entered for this period [08/29/23 1505]  Physical Exam: Const: Awake, alert in NAD HENT: Normocephalic, atraumatic, mucus membranes moist Card: RRR, No MRG, No pitting edema on LE's bilaterally  Resp: LCTAB, no increased work of breathing Abd: Soft, NTND, Bsx4 Extremities: Warm, pink   Patient Lines/Drains/Airways Status     Active Line/Drains/Airways     Name Placement date Placement time Site Days   Peripheral IV 08/26/23 20 G Posterior;Right Hand 08/26/23  1105  Hand  1            Pertinent labs and imaging:      Latest Ref Rng & Units 08/29/2023    4:40 AM 08/28/2023    3:19 AM 08/27/2023    4:32 AM  CBC  WBC 4.0 - 10.5 K/uL 7.4  9.5  9.5   Hemoglobin 12.0 - 15.0 g/dL 86.7  87.2  87.0   Hematocrit 36.0 - 46.0 % 39.2  37.8  38.4   Platelets 150 - 400 K/uL 331  346   351        Latest Ref Rng & Units 08/29/2023    4:40 AM 08/28/2023    4:39 PM 08/28/2023    3:19 AM  CMP  Glucose 70 - 99 mg/dL 78  84  87   BUN 6 - 20 mg/dL <5  <5  5   Creatinine 0.44 - 1.00 mg/dL 9.13  9.10  9.11   Sodium 135 - 145 mmol/L 136  136  133   Potassium 3.5 - 5.1 mmol/L 4.0  3.4  3.2   Chloride 98 - 111 mmol/L 105  101  101   CO2 22 - 32 mmol/L 22  25  23    Calcium 8.9 - 10.3 mg/dL 8.4  8.5  8.0     No results found.   ASSESSMENT/PLAN:  Assessment: Principal Problem:   Intractable nausea and vomiting Active Problems:   OSA (obstructive sleep apnea)   Tobacco use   Cannabinoid hyperemesis syndrome   Bipolar disorder (HCC)   Nausea and vomiting   Plan: #Intractable N/V Hx of cannabinoid hyperemesis syndrome RUQ pain GLP-1 agonist use Hematemesis Globus Sensation Pt has a longstanding history with cannabinoid hyperemesis. Uncertain  of timeline or quantification of marijuana use. She does have a history of multiple ED visits due to cyclic vomiting. No symptomatic improvement with zofran , phenergan , or compazine . -Continue capsaicin  cream on the abdomen TID for relief of cannabinoid hyperemesis symptoms.   -Post emetic abdominal pain improving. -Will change Reglan  to PO -Will advance diet as tolerated and discharge today if patient tolerates oral intake  #AGMA Electrolyte disturbances AGMA resolved. Like due to hypovolemia 2/2 to emesis and decreased PO intake. K improved today at 4.0  #OSA Pt with OSA and CPAP noncompliance. CPAP ordered for at night, however pt continues to decline use.   #Bipolar Continue home meds of abilify , trileptal . EKG yesterday without QT prolongation.   #Tobacco Use Pt smokes 3-4 cigarettes per day. Will monitor and add nicotine  patch if needed.   Best Practice: Diet: Clear liquid diet, advance as tolerated IVF: Fluids: 0.9NS, Rate: 100 cc/hr x 10 hrs VTE: SCDs Start: 08/26/23 1614 Code: Full  Disposition  planning: Therapy Recs: None, DME: none Family Contact: Kristin Wright (mother), to be notified. DISPO: Anticipated discharge pending to Home pending Clinical improvement.  Signature:  Schuyler Novak, DO, PGY-1 [name]  3:05 PM, 08/29/2023  On Call pager [phone]

## 2023-08-30 NOTE — Discharge Summary (Signed)
 Name: Kristin Wright MRN: [id number] DOB: 20-Feb-1985 38 y.o. PCP: Wright, Jaden, DO  Date of Admission: 08/26/2023  8:17 AM Date of Discharge: 08/30/2023 Attending Physician: Dr. MICAEL Riis Winfrey  Discharge Diagnosis: 1. Principal Problem:   Intractable nausea and vomiting Active Problems:   OSA (obstructive sleep apnea)   Tobacco use   Cannabinoid hyperemesis syndrome   Bipolar disorder (HCC)   Nausea and vomiting  Discharge Medications: Allergies as of 08/30/2023       Reactions   Asa [aspirin] Anaphylaxis   Milk-related Compounds Nausea And Vomiting   Penicillins Other (See Comments)   Unknown childhood reaction   Soy Allergy (obsolete) Other (See Comments)   Per allergy test   Yeast-derived Drug Products Other (See Comments)   Per allergy test        Medication List     PAUSE taking these medications    metoCLOPramide  10 MG tablet Wait to take this until your doctor or other care provider tells you to start again. Commonly known as: REGLAN  Take 1 tablet (10 mg total) by mouth every 6 (six) hours as needed for up to 10 days for nausea or refractory nausea / vomiting. You also have another medication with the same name that you may need to continue taking.       STOP taking these medications    ondansetron  4 MG disintegrating tablet Commonly known as: ZOFRAN -ODT   promethazine  25 MG suppository Commonly known as: PHENERGAN    promethazine  25 MG tablet Commonly known as: PHENERGAN    tirzepatide  5 MG/0.5ML injection vial       TAKE these medications    ARIPiprazole  5 MG tablet Commonly known as: Abilify  Take 1 tablet (5 mg total) by mouth daily.   capsicum 0.075 % topical cream Commonly known as: ZOSTRIX Apply topically 3 (three) times daily.   cholecalciferol  25 MCG (1000 UNIT) tablet Commonly known as: VITAMIN D3 Take 1 tablet (25 mcg total) by mouth daily.   metoCLOPramide  10 MG tablet Commonly known as: REGLAN  Take 1 tablet (10  mg total) by mouth every 6 (six) hours as needed for up to 5 days for nausea. What changed: Another medication with the same name was paused. Ask your nurse or doctor if you should take this medication.   nicotine  14 mg/24hr patch Commonly known as: NICODERM CQ  - dosed in mg/24 hours Place 1 patch (14 mg total) onto the skin daily.   OXcarbazepine  150 MG tablet Commonly known as: Trileptal  Take 1 tablet (150 mg total) by mouth 2 (two) times daily.   pantoprazole  40 MG tablet Commonly known as: Protonix  Take 1 tablet (40 mg total) by mouth daily.   scopolamine  1 MG/3DAYS Commonly known as: TRANSDERM-SCOP Place 1 patch (1.5 mg total) onto the skin every 3 (three) days.   sucralfate  1 GM/10ML suspension Commonly known as: Carafate  Take 10 mLs (1 g total) by mouth 4 (four) times daily for 10 days.   Vitamin D  (Ergocalciferol ) 1.25 MG (50000 UNIT) Caps capsule Commonly known as: DRISDOL  Take 1 capsule (50,000 Units total) by mouth every 7 (seven) days. TAKE IT ONLY ONCE A WEEK What changed:  when to take this additional instructions        Disposition and follow-up:   [name] was discharged from [facility] in Stable condition.  At the hospital follow up visit please address:  Cannabinoid hyperemesis syndrome BMI 48.5 - S/p discontinuation of Zepbound  5 mg - Will need psychotherapy support and  referral for substance use disorder - Assess PO intake and need for further Reglan  - If GLP-1 RA trial, will start lowest dose for prolonged period before uptitration  2.  Labs / imaging needed at time of follow-up: BMP, Mg2+  3.  Pending labs/ test needing follow-up: None  Follow-up Appointments:  Follow-up Information     Wright, Jaden, DO. Schedule an appointment as soon as possible for a visit.   Specialty: Internal Medicine Why: Call the office and schedule an appointment with the office in the next 7-10 days. Contact information: [address] [place] [address] [phone]                  Hospital Course by problem list: Kristin Wright is a 38 y.o. person living with a history of cannabinoid hyperemesis syndrome, GERD, Bipolar disorder, and OSA who presented with intractable nausea and vomiting and admitted for same complicated for poor oral intake due to intractable nausea. She was discharged on hospital day 3 with the following pertinent hospital course:  Intractable N/V Cannabinoid hyperemesis syndrome GLP-1 RA  use Pt presented to the ED on 7/7 with intractable nausea and vomiting. She did recently increase the dose of her GLP-1. She was giver droperidol  in the ED with nausea relief. After admission, she was given phenergan  and zofran  without relief. LR maintenance was initiated with IV reglan  and capsaicin  cream. Pt showed moderate improvement after 24 hours and was able to tolerate sips of water. On 7/10, she was feeling significantly better with improvement of symptoms and asking to be discharged. After toleration of solid food, she was deemed safe for discharge. She had to wait until the morning of 7/11 to be able to leave the hospital, however, team had placed discharge orders on 7/10. At discharge, please assess her PO intake, abstinence from cannabis use, and further need for antiemetics. If considering restarting a GLP-1 RA in the future, would suggest continuing on the starting dose    AGMA, resolved Electrolyte Disturbance AGMA present on admission. Likely due to hypovolemia and decreased oral intake. Resolved with initiation of fluid and potassium repletion.   Substance use disorder Tobacco and marijuana products. Discussed this extensibly. She has excellent social support from aunts and mother, present at beside. She is amenable to referral to therapist for ongoing remission from [facility] products. Trialed nicotine  patch while inpatient, but this worsened nausea. Would benefit of  pharmacologic therapy Bupropion vs Chantix ; discuss at follow up.  OSA Pt with OSA and CPAP noncompliance. CPAP ordered for at night, however pt continues to decline use.    Bipolar disorder Continue home meds of abilify , trileptal . Follows with [facility] psychiatry.    Pertinent Labs, Studies, and Procedures:     Latest Ref Rng & Units 08/29/2023    4:40 AM 08/28/2023    3:19 AM 08/27/2023    4:32 AM  CBC  WBC 4.0 - 10.5 K/uL 7.4  9.5  9.5   Hemoglobin 12.0 - 15.0 g/dL 86.7  87.2  87.0   Hematocrit 36.0 - 46.0 % 39.2  37.8  38.4   Platelets 150 - 400 K/uL 331  346  351        Latest Ref Rng & Units 08/29/2023    4:40 AM 08/28/2023    4:39 PM 08/28/2023    3:19 AM  CMP  Glucose 70 - 99 mg/dL 78  84  87   BUN 6 - 20 mg/dL <5  <5  5   Creatinine 0.44 - 1.00 mg/dL 9.13  9.10  9.11   Sodium 135 - 145 mmol/L 136  136  133   Potassium 3.5 - 5.1 mmol/L 4.0  3.4  3.2   Chloride 98 - 111 mmol/L 105  101  101   CO2 22 - 32 mmol/L 22  25  23    Calcium 8.9 - 10.3 mg/dL 8.4  8.5  8.0     DG Chest 2 View Result Date: 08/26/2023 CLINICAL DATA:  Chest pain. EXAM: CHEST - 2 VIEW COMPARISON:  08/23/2023. FINDINGS: Stable cardiomediastinal silhouette. No focal consolidation, pleural effusion, or pneumothorax. No acute osseous abnormality. IMPRESSION: No acute cardiopulmonary findings. Electronically Signed   By: Harrietta Sherry M.D.   On: 08/26/2023 13:03   US  Abdomen Limited RUQ (LIVER/GB) Result Date: 08/26/2023 CLINICAL DATA:  Right upper quadrant abdominal pain. EXAM: ULTRASOUND ABDOMEN LIMITED RIGHT UPPER QUADRANT COMPARISON:  Ultrasound dated 08/16/2007. FINDINGS: Gallbladder: No gallstones or wall thickening visualized. No sonographic Murphy sign noted by sonographer. Common bile duct: Diameter: 4 mm Liver: No focal lesion identified. Within normal limits in parenchymal echogenicity. Portal vein is patent on color Doppler imaging with normal direction of blood flow towards the liver. Other:  None. IMPRESSION: Unremarkable right upper quadrant ultrasound. Electronically Signed   By: Vanetta Chou M.D.   On: 08/26/2023 10:23     Discharge Instructions: Discharge Instructions     Call MD for:  difficulty breathing, headache or visual disturbances   Complete by: As directed    Call MD for:  extreme fatigue   Complete by: As directed    Call MD for:  persistant dizziness or light-headedness   Complete by: As directed    Call MD for:  persistant nausea and vomiting   Complete by: As directed    Discharge instructions   Complete by: As directed    Thank you for allowing us  to be part of your care. You were hospitalized for intractable nausea and vomiting. We treated you with   See the changes in your medications and management of your chronic conditions below:  *For your Nausea and Vomiting -We have STARTED you on these following medications:  -Reglan  (Metoclopromide) every 6 hours if needed for nausea  -Scopolamine  patch--Change every 3 days  -Capsaicin  Cream--Up to 3 times per day (Wear gloves with application)  -Pantoprazole -- every 12 hours   -Please see the [facility] clinic in 7 to 10 days  FOLLOW UP APPOINTMENTS: We arranged for you to follow up with your family doctor at: The internal medicine center at [address] at 8:45 AM  Please make sure to  Please call your PCP or our clinic if you have any questions or concerns, we may be able to help and keep you from a long and expensive emergency room wait. Our clinic and after hours phone number is [phone]. The best time to call is Monday through Friday 9 am to 4 pm but there is always someone available 24/7 if you have an emergency. If you need medication refills please notify your pharmacy one week in advance and they will send us  a request.   We are glad you are feeling better,  Internal Medicine Inpatient Teaching Service at [facility]       Signed: Elnora Ip, MD 08/30/2023, 2:05 PM

## 2023-09-02 ENCOUNTER — Ambulatory Visit (INDEPENDENT_AMBULATORY_CARE_PROVIDER_SITE_OTHER): Admitting: Student

## 2023-09-02 ENCOUNTER — Telehealth: Payer: Self-pay | Admitting: *Deleted

## 2023-09-02 VITALS — BP 119/76 | HR 88 | Temp 98.2°F | Ht 68.0 in | Wt 303.6 lb

## 2023-09-02 DIAGNOSIS — Z124 Encounter for screening for malignant neoplasm of cervix: Secondary | ICD-10-CM | POA: Insufficient documentation

## 2023-09-02 DIAGNOSIS — F1290 Cannabis use, unspecified, uncomplicated: Secondary | ICD-10-CM

## 2023-09-02 DIAGNOSIS — F319 Bipolar disorder, unspecified: Secondary | ICD-10-CM

## 2023-09-02 DIAGNOSIS — R112 Nausea with vomiting, unspecified: Secondary | ICD-10-CM | POA: Diagnosis present

## 2023-09-02 LAB — BASIC METABOLIC PANEL WITH GFR
BUN/Creatinine Ratio: 8 — ABNORMAL LOW (ref 9–23)
BUN: 8 mg/dL (ref 6–20)
CO2: 24 mmol/L (ref 20–29)
Calcium: 9.5 mg/dL (ref 8.7–10.2)
Chloride: 99 mmol/L (ref 96–106)
Creatinine, Ser: 0.99 mg/dL (ref 0.57–1.00)
Glucose: 88 mg/dL (ref 70–99)
Potassium: 4.2 mmol/L (ref 3.5–5.2)
Sodium: 137 mmol/L (ref 134–144)
eGFR: 75 mL/min/1.73 (ref >59)

## 2023-09-02 LAB — MAGNESIUM: Magnesium: 2 mg/dL (ref 1.6–2.3)

## 2023-09-02 NOTE — Patient Instructions (Signed)
 Thank you, Kristin Wright for allowing us  to provide your care today. Today we discussed:  Continue current medications  Will recheck labs today.  Please schedule an appointment with your psychiatry and [facility].    I have ordered the following labs for you:   Lab Orders         BMP w Anion Gap (STAT/Sunquest-performed on-site)         Magnesium      Tests ordered today:  Above   Referrals ordered today:   Referral Orders  No referral(s) requested today     I have ordered the following medication/changed the following medications:   Stop the following medications: There are no discontinued medications.   Start the following medications: No orders of the defined types were placed in this encounter.    Follow up: as needed     Remember:   Should you have any questions or concerns please call the internal medicine clinic at [phone].     Rayann Atway, D.O. [facility]

## 2023-09-02 NOTE — Progress Notes (Unsigned)
   Established Patient Office Visit  Subjective   Patient ID: Chuck ONEIDA Moats, female    DOB: [birth year]  Age: 38 y.o. MRN: [id number]  No chief complaint on file.   HPI This is a 38 year old female with past medical history of cannabinoid hyperemesis syndrome, GERD, bipolar disorder, OSA who is presenting to the clinic for hospital follow-up, admitted on 08/26/2023 for intractable nausea and vomiting was thought to be secondary to [facility] and increase in GLP-1.  ROS   As per assessment and plan Objective:     LMP 08/09/2023 (Approximate)  BP Readings from Last 3 Encounters:  08/29/23 (!) 146/87  08/25/23 (!) 109/50  08/23/23 110/72   Wt Readings from Last 3 Encounters:  08/26/23 (!) 319 lb (144.7 kg)  08/23/23 (!) 320 lb (145.2 kg)  08/16/23 (!) 319 lb 6.4 oz (144.9 kg)      Physical Exam   No results found for any visits on 09/02/23.  Last CBC Lab Results  Component Value Date   WBC 7.4 08/29/2023   HGB 13.2 08/29/2023   HCT 39.2 08/29/2023   MCV 97.8 08/29/2023   MCH 32.9 08/29/2023   RDW 12.3 08/29/2023   PLT 331 08/29/2023   Last metabolic panel Lab Results  Component Value Date   GLUCOSE 78 08/29/2023   NA 136 08/29/2023   K 4.0 08/29/2023   CL 105 08/29/2023   CO2 22 08/29/2023   BUN <5 (L) 08/29/2023   CREATININE 0.86 08/29/2023   GFRNONAA >60 08/29/2023   CALCIUM 8.4 (L) 08/29/2023   PROT 6.6 08/27/2023   ALBUMIN 3.4 (L) 08/27/2023   LABGLOB 2.9 07/03/2017   AGRATIO 1.4 07/03/2017   BILITOT 0.9 08/27/2023   ALKPHOS 44 08/27/2023   AST 18 08/27/2023   ALT 14 08/27/2023   ANIONGAP 9 08/29/2023   Lab Results  Component Value Date   HGBA1C 5.5 02/28/2023     The ASCVD Risk score (Arnett DK, et al., 2019) failed to calculate for the following reasons:   The 2019 ASCVD risk score is only valid for ages 25 to 9    Assessment & Plan:  Karna   Intractable nausea and vomiting -Symptoms improved of nausea and vomiting -Improving with current  antiemetics: Reglan , scopolamine  patch and carafate  and Protonix .  -P.o. intake -Abstinence from cannabis use  -Will continue to hold GLP-1 (zepgound 5 mg ) until her symptoms resolve, will consider starting at the starting dose.  Plan: - Reglan  continuation - dont. Watch for extrapyramidal   Tobacco use - will see Bianca for subtance use conselling   OSA -Needs to be on CPAP at night  Bipolar -Home medication consist of Abilify , Trileptal  - Follows:-Psychiatry  Health Maintenance - Due for Pap smear  Problem List Items Addressed This Visit   None   No follow-ups on file.    Toma Edwards, DO

## 2023-09-02 NOTE — Telephone Encounter (Signed)
 Received fax from [facility] - BUN/Crea Ratio 8; drawn 7/14.25. Pt saw Dr Heddy this am.

## 2023-09-03 ENCOUNTER — Ambulatory Visit: Payer: Self-pay | Admitting: Student

## 2023-09-03 NOTE — Assessment & Plan Note (Signed)
 Reports that she has not smoked marijuana since discharge. Reports resolving of her symptoms. - Continue to abstain from cannabis use

## 2023-09-03 NOTE — Assessment & Plan Note (Addendum)
 Patient is presents today for HFU, recently admitted on 08/26/18/2025 for intractable nausea and vomiting was thought to be secondary to cannabinoid hyperemesis syndrome and increasing GLP-1. Patient reports that recently she increased the dose of Zepbound  to 5 mg weekly and has been smoking cannibus which resulted in her symptoms of intractable nausea and vomiting resulting in her going hospital. On exam today, she reports improvement in her symptoms.  Reports that since discharge, she only had 2 episodes of vomiting, denies any episodes this morning.  Reports that she is tolerating p.o. intake well.  Reports that she has not smoked marijuana since discharge.  States that she is currently taking antiemetics, Reglan , scopolamine  patch, Carafate  and Protonix .  Denies any abdominal pain.  Exam is unremarkable. - Continue to hold Zepbound  until your symptoms resolve, then consider starting at the starting dose with careful monitoring - Continue Carafate  and Protonix  - Continue scopolamine  patch - Continue the current Reglan  for now, once his prescription is done.  Will need to be reevaluated if the Reglan  needs to be continued d/t to her risks of extrapyramidal symptoms.   - Will check BMP and Mg

## 2023-09-03 NOTE — Assessment & Plan Note (Signed)
 Referral to gynecology was placed for Pap smear.

## 2023-09-03 NOTE — Assessment & Plan Note (Signed)
 Her medication consist of Abilify  and Trileptal .  Reports that she follows psychiatry, will schedule an appointment soon.

## 2023-09-04 NOTE — Progress Notes (Signed)
 Internal Medicine Clinic Attending  Case discussed with the resident at the time of the visit.  We reviewed the resident's history and exam and pertinent patient test results.  I agree with the assessment, diagnosis, and plan of care documented in the resident's note.

## 2023-09-06 ENCOUNTER — Encounter: Payer: Self-pay | Admitting: Advanced Practice Midwife

## 2023-09-10 ENCOUNTER — Ambulatory Visit: Admitting: Licensed Clinical Social Worker

## 2023-09-10 DIAGNOSIS — F319 Bipolar disorder, unspecified: Secondary | ICD-10-CM

## 2023-09-10 NOTE — BH Specialist Note (Signed)
 Integrated Behavioral Health Follow Up In-Person Visit  MRN: [id number] Name: Kristin Wright  Number of Integrated Behavioral Health Clinician visits: 1- Initial Visit  Session Start time: 0930   Session End time: 1030  Total time in minutes: 60    Types of Service: General Behavioral Integrated Care (BHI)  Interpretor:No. Interpretor Name and Language: N/A  Subjective: Kristin Wright is a 38 y.o. female   Patient was referred by PCP for Counseling. Patient reports the following symptoms/concerns: Session was conducted today with the Behavioral Health Clinician. Patient presented in stable condition and was receptive to support. Patient reported continuing medication management with [facility] for [facility] and has recently initiated counseling services with an outside agency. Patient also plans to inquire about outpatient substance use treatment options. During the session, patient shared progress in reducing marijuana use from four blunts per day to one and cutting back on tobacco use from two packs of cigarettes daily to only two singles per day. Patient discontinued use of Zepbound  due to an adverse reaction but remains open to exploring alternatives such as Wegovy or Ozempic in consultation with their provider. Patient plans to follow up with their PCP for further medical coordination. As this was the first session with [facility] since February 2025, today's visit focused on gathering updates, re-establishing care, and reconnecting the patient with appropriate resources. Patient understands she will be discharged from [facility] care once she establishes with [facility]. Patient has complex needs that require more support.    Duration of problem: more than 12 months; Severity of problem: moderate  Objective: Mood: Anxious and Affect: Appropriate Risk of harm to self or others: No plan to harm self or others  Life Context: Family and Social: Not discussed during  encounter School/Work: Not discussed during encounter Self-Care: Not discussed during encounter Life Changes: Not discussed during encounter  Patient and/or Family's Strengths/Protective Factors: Social connections  Goals Addressed: Patient will:  Reduce symptoms of: mood instability   Increase knowledge and/or ability of: coping skills   Demonstrate ability to: Increase healthy adjustment to current life circumstances  Progress towards Goals: Ongoing  Interventions: Interventions utilized:  CBT Cognitive Behavioral Therapy and Link to [facility] Standardized Assessments completed: PHQ-SADS      Patient and/or Family Response: Patient agrees to treatment plan and establishing care with [facility]  Patient Centered Plan: Patient is on the following Treatment Plan(s): Contact [facility]   Clinical Assessment/Diagnosis  Bipolar affective disorder, remission status unspecified (HCC)    Assessment: Patient currently experiencing Substance use disorder; Mood instability.   Patient may benefit from Medication Management and outpatient substance abuse treatment.  Plan: Follow up with behavioral health clinician on : 07/28  Renda Pontes, MSW, LCSW-A She/Her Behavioral Health Clinician [facility]  [facility]

## 2023-09-16 ENCOUNTER — Ambulatory Visit: Admitting: Licensed Clinical Social Worker

## 2023-09-16 DIAGNOSIS — F319 Bipolar disorder, unspecified: Secondary | ICD-10-CM | POA: Diagnosis not present

## 2023-09-16 NOTE — BH Specialist Note (Signed)
 Integrated Behavioral Health via Telemedicine Visit  09/16/2023 ARNITA KOONS [id number]  Number of Integrated Behavioral Health Clinician visits: Additional Visit  Session Start time: 1330   Session End time: 1409 Total time in minutes: 39    Referring Provider: PCP Patient/Family location: [facility] Provider location: Office All persons participating in visit: [facility] and Patient Types of Service: Individual psychotherapy and Telephone visit  I connected with Corretta T Dibartolo via  Telephone and verified that I am speaking with the correct person using two identifiers. Discussed confidentiality: Yes   I discussed the limitations of telemedicine and the availability of in person appointments.  Discussed there is a possibility of technology failure and discussed alternative modes of communication if that failure occurs.  I discussed that engaging in this telemedicine visit, they consent to the provision of behavioral healthcare and the services will be billed under their insurance.  Patient and/or legal guardian expressed understanding and consented to Telemedicine visit: Yes   Presenting Concerns: Patient and/or family reports the following symptoms/concerns: Session held today via telehealth at patient's request due to transportation-related stressor. Appointment was originally scheduled in person. Patient continues efforts to schedule an appointment with [facility]; however, the psychiatrist was unavailable today but is expected to be in office tomorrow. Patient also expressed openness to trying [place] services if needed.  Patient reported some improvement with her current medication patch but noted ongoing symptoms she does not find tolerable. She stated she will reach out to her PCP to discuss medication adjustments.  Patient shared current stressors including utility bills, financial strain, and being informed that she will not receive a paycheck from her employer this week.  Therapist introduced and discussed the STOP skill (Stop, Take a breath, Observe, Proceed mindfully) to help the patient manage emotional reactions and stay focused on her goals. Patient was encouraged to limit emotional impulsivity when navigating stress.  Patient agreed to follow up with [facility] regarding her medication management appointment and remains motivated to stay on track with her treatment plan. Duration of problem: More than a year; Severity of problem: moderate  Patient and/or Family's Strengths/Protective Factors: Sense of purpose  Goals Addressed: Patient will:  Reduce symptoms of: mood instability  Progress towards Goals: Ongoing    Interventions: Interventions utilized:  CBT Cognitive Behavioral Therapy Standardized Assessments completed: Patient declined screening    Patient and/or Family Response: Patient agrees to continue services with [facility] until established with [facility].   Clinical Assessment/Diagnosis  Bipolar affective disorder, remission status unspecified (HCC)    Assessment: Patient currently experiencing Mood instability.   Patient may benefit from Medication management and Mental Health services with [facility]  Plan: Follow up with behavioral health clinician on : Within the next two weeks   I discussed the assessment and treatment plan with the patient and/or parent/guardian. They were provided an opportunity to ask questions and all were answered. They agreed with the plan and demonstrated an understanding of the instructions.   They were advised to call back or seek an in-person evaluation if the symptoms worsen or if the condition fails to improve as anticipated.  Renda Pontes, MSW, LCSW-A She/Her Behavioral Health Clinician [facility]  Internal Medicine Center

## 2023-09-20 ENCOUNTER — Ambulatory Visit: Admitting: Student

## 2023-09-20 ENCOUNTER — Other Ambulatory Visit (HOSPITAL_COMMUNITY): Payer: Self-pay

## 2023-09-20 VITALS — BP 127/96 | HR 94 | Temp 98.2°F | Ht 68.0 in | Wt 307.2 lb

## 2023-09-20 DIAGNOSIS — R112 Nausea with vomiting, unspecified: Secondary | ICD-10-CM | POA: Diagnosis not present

## 2023-09-20 DIAGNOSIS — G43E19 Chronic migraine with aura, intractable, without status migrainosus: Secondary | ICD-10-CM

## 2023-09-20 DIAGNOSIS — F17210 Nicotine dependence, cigarettes, uncomplicated: Secondary | ICD-10-CM

## 2023-09-20 DIAGNOSIS — Z6841 Body Mass Index (BMI) 40.0 and over, adult: Secondary | ICD-10-CM | POA: Diagnosis not present

## 2023-09-20 DIAGNOSIS — R319 Hematuria, unspecified: Secondary | ICD-10-CM

## 2023-09-20 DIAGNOSIS — G4733 Obstructive sleep apnea (adult) (pediatric): Secondary | ICD-10-CM

## 2023-09-20 DIAGNOSIS — Z720 Tobacco use: Secondary | ICD-10-CM

## 2023-09-20 MED ORDER — AMITRIPTYLINE HCL 25 MG PO TABS
25.0000 mg | ORAL_TABLET | Freq: Every day | ORAL | 0 refills | Status: DC
  Filled 2023-09-20: qty 90, 90d supply, fill #0

## 2023-09-20 MED ORDER — ZEPBOUND 2.5 MG/0.5ML ~~LOC~~ SOAJ
2.5000 mg | SUBCUTANEOUS | 0 refills | Status: DC
  Filled 2023-09-20: qty 2, 28d supply, fill #0

## 2023-09-20 NOTE — Progress Notes (Signed)
 Patient name: Kristin Wright Date of birth: [birth year] Date of visit: 09/20/23  Subjective   Chief concern: hurting right behind my left eye  Migraine  Associated symptoms include neck pain (stiff neck). Pertinent negatives include no blurred vision, fever, nausea or vomiting.  Headache  This is a recurrent problem. The current episode started 1 to 4 weeks ago. The problem occurs daily. The problem has been waxing and waning. The pain is located in the Left unilateral region. Radiates to: back of head. The pain quality is not similar to prior headaches (in terms of location). The quality of the pain is described as sharp and aching. The pain is at a severity of 8/10. The pain is severe. Associated symptoms include neck pain (stiff neck). Pertinent negatives include no blurred vision, fever, nausea or vomiting. She has tried NSAIDs (blue light glasses, nurtec not helping) for the symptoms. The treatment provided no relief (nurtec used to work but no longer).     Review of Systems  Constitutional:  Negative for fever.  Eyes:  Negative for blurred vision and double vision.       Spots in the left eye  Gastrointestinal:  Negative for nausea and vomiting.  Musculoskeletal:  Positive for neck pain (stiff neck).  Neurological:  Positive for headaches.    Current Outpatient Medications  Medication Instructions   ARIPiprazole  (ABILIFY ) 5 mg, Oral, Daily   capsicum (ZOSTRIX) 0.075 % topical cream Topical, 3 times daily   cholecalciferol  (VITAMIN D3) 25 mcg, Oral, Daily   [Paused] metoCLOPramide  (REGLAN ) 10 mg, Oral, Every 6 hours PRN   metoCLOPramide  (REGLAN ) 10 mg, Oral, Every 6 hours PRN   nicotine  (NICODERM CQ  - DOSED IN MG/24 HOURS) 14 mg, Transdermal, Daily   OXcarbazepine  (TRILEPTAL ) 150 mg, Oral, 2 times daily   pantoprazole  (PROTONIX ) 40 mg, Oral, Daily   scopolamine  (TRANSDERM-SCOP) 1.5 mg, Transdermal, every 72 hours   sucralfate  (CARAFATE ) 1 g, Oral, 4 times daily   Vitamin  D (Ergocalciferol ) (DRISDOL ) 50,000 Units, Oral, Every 7 days, TAKE IT ONLY ONCE A WEEK     Objective  Today's Vitals   09/20/23 0952  BP: (!) 127/96  Pulse: 94  Temp: 98.2 F (36.8 C)  TempSrc: Oral  SpO2: 95%  Weight: (!) 307 lb 3.2 oz (139.3 kg)  Height: 5' 8 (1.727 m)  Body mass index is 46.71 kg/m.   Physical Exam Constitutional:      Appearance: Normal appearance.  Eyes:     General: No visual field deficit. Cardiovascular:     Rate and Rhythm: Normal rate and regular rhythm.  Pulmonary:     Effort: Pulmonary effort is normal. No respiratory distress.  Skin:    General: Skin is warm and dry.  Neurological:     Mental Status: She is alert.     Cranial Nerves: No cranial nerve deficit, dysarthria or facial asymmetry.     Motor: No weakness, atrophy or pronator drift.     Coordination: Finger-Nose-Finger Test and Heel to [employer] normal.  Psychiatric:        Mood and Affect: Affect normal.        Speech: Speech normal.        Behavior: Behavior normal.      Assessment & Plan   Intractable chronic migraine with aura and without status migrainosus Assessment & Plan: Continues to have her typical migraines daily. Review of her chart shows very similar symptoms in terms of laterality and vision changes including black spots in  2023. She has had MRI and EEG, both normal. She has tried Imitrex , propranolol , and Topamax. She may do well with CGRP inhibitor, recommend she go back to neurology to discuss. In the meantime I'll try a low dose of amitriptyline .   Orders: -     Amitriptyline  HCl; Take 1 tablet (25 mg total) by mouth at bedtime.  Dispense: 90 tablet; Refill: 0  Hematuria, unspecified type Assessment & Plan: Incidental finding during last hospitalization. Not menstruating. No frank hematuria. Asymptomatic.  Orders: -     Urinalysis, Routine w reflex microscopic  Morbid obesity with BMI of 50.0-59.9, adult [facility]) Assessment & Plan: Body mass index is  46.71 kg/m.  I recommend eliminating sugary beverages like soda, sweet tea, and juice entirely. I recommended more vegetables, lean protein, and legumes. Frozen vegetables are healthy and inexpensive. Beans are a healthy and inexpensive source of lean protein and fiber. I recommend gradually increasing exercise. A daily walk is a great way to start an exercise program.  Referral: recommend referral to dietitian  Pharmacological intervention: restart Zepbound  and low dose   Orders: -     Zepbound ; Inject 2.5 mg into the skin once a week. Call for refill and to discuss increasing your dose.  Dispense: 2 mL; Refill: 0  OSA (obstructive sleep apnea) -     Zepbound ; Inject 2.5 mg into the skin once a week. Call for refill and to discuss increasing your dose.  Dispense: 2 mL; Refill: 0  Tobacco use Assessment & Plan: She's using nicotine  patches to help with quitting.   Intractable nausea and vomiting Assessment & Plan: Improving. Slowly restart GLP-1.  Medications Discontinued During This Encounter  Medication Reason   metoCLOPramide  (REGLAN ) 10 MG tablet    metoCLOPramide  (REGLAN ) 10 MG tablet         Return in about 4 weeks (around 10/18/2023) for migraine check in and increasing zepbound .  Ozell Kung MD 09/20/2023, 12:37 PM

## 2023-09-20 NOTE — Patient Instructions (Addendum)
 Return in about 4 weeks (around 10/18/2023) for migraine check in and increasing zepbound .  Remember to bring all of the medications that you take (including over the counter medications and supplements) with you to every clinic visit.  This after visit summary is an important review of tests, referrals, and medication changes that were discussed during your visit. If you have questions or concerns, call [phone]. Outside of clinic business hours, call the main hospital at [phone] and ask the operator for the on-call internal medicine resident.   Ozell Kung MD 09/20/2023, 10:25 AM

## 2023-09-20 NOTE — Assessment & Plan Note (Signed)
 Incidental finding during last hospitalization. Not menstruating. No frank hematuria. Asymptomatic.

## 2023-09-20 NOTE — Assessment & Plan Note (Signed)
 She's using nicotine  patches to help with quitting.

## 2023-09-20 NOTE — Progress Notes (Signed)
 Internal Medicine Clinic Attending  Case discussed with the resident at the time of the visit.  We reviewed the resident's history and exam and pertinent patient test results.  I agree with the assessment, diagnosis, and plan of care documented in the resident's note.

## 2023-09-20 NOTE — Assessment & Plan Note (Signed)
 Body mass index is 46.71 kg/m.  I recommend eliminating sugary beverages like soda, sweet tea, and juice entirely. I recommended more vegetables, lean protein, and legumes. Frozen vegetables are healthy and inexpensive. Beans are a healthy and inexpensive source of lean protein and fiber. I recommend gradually increasing exercise. A daily walk is a great way to start an exercise program.  Referral: recommend referral to dietitian  Pharmacological intervention: restart Zepbound  and low dose

## 2023-09-20 NOTE — Assessment & Plan Note (Signed)
 Improving. Slowly restart GLP-1.  Medications Discontinued During This Encounter  Medication Reason   metoCLOPramide  (REGLAN ) 10 MG tablet    metoCLOPramide  (REGLAN ) 10 MG tablet

## 2023-09-20 NOTE — Assessment & Plan Note (Signed)
 Continues to have her typical migraines daily. Review of her chart shows very similar symptoms in terms of laterality and vision changes including black spots in 2023. She has had MRI and EEG, both normal. She has tried Imitrex , propranolol , and Topamax. She may do well with CGRP inhibitor, recommend she go back to neurology to discuss. In the meantime I'll try a low dose of amitriptyline .

## 2023-09-21 LAB — URINALYSIS, ROUTINE W REFLEX MICROSCOPIC
Bilirubin, UA: NEGATIVE
Glucose, UA: NEGATIVE
Leukocytes,UA: NEGATIVE
Nitrite, UA: NEGATIVE
Specific Gravity, UA: 1.024 (ref 1.005–1.030)
Urobilinogen, Ur: 1 mg/dL (ref 0.2–1.0)
pH, UA: 6 (ref 5.0–7.5)

## 2023-09-21 LAB — MICROSCOPIC EXAMINATION
Casts: NONE SEEN /LPF
WBC, UA: NONE SEEN /HPF (ref 0–5)

## 2023-09-23 ENCOUNTER — Other Ambulatory Visit: Payer: Self-pay | Admitting: Neurology

## 2023-09-23 ENCOUNTER — Other Ambulatory Visit (HOSPITAL_COMMUNITY): Payer: Self-pay

## 2023-09-23 ENCOUNTER — Encounter: Payer: Self-pay | Admitting: Student

## 2023-09-23 ENCOUNTER — Telehealth: Payer: Self-pay | Admitting: Neurology

## 2023-09-23 DIAGNOSIS — G43E19 Chronic migraine with aura, intractable, without status migrainosus: Secondary | ICD-10-CM

## 2023-09-23 MED ORDER — AMITRIPTYLINE HCL 25 MG PO TABS
50.0000 mg | ORAL_TABLET | Freq: Every day | ORAL | 0 refills | Status: DC
  Filled 2023-09-23: qty 90, 45d supply, fill #0

## 2023-09-23 NOTE — Telephone Encounter (Signed)
 She is on 25 mg and would be interested in going up on that med. She is going to call me back to verify pharmacy

## 2023-09-23 NOTE — Telephone Encounter (Signed)
 She is fine going up on the medication. If you need anything else from her, call her.   [facility]- [facility] [address].

## 2023-09-23 NOTE — Telephone Encounter (Signed)
 Pt made a f/u visit to [facility] for her headaches. Her last time she was here was 2023. She has been having increased HA. She needs relief. Georjean is booking into 04/2024 and she is on the wait list. She wants to know what she can do to help her

## 2023-09-24 ENCOUNTER — Ambulatory Visit: Payer: Self-pay | Admitting: Student

## 2023-09-24 DIAGNOSIS — R319 Hematuria, unspecified: Secondary | ICD-10-CM

## 2023-09-24 NOTE — Telephone Encounter (Addendum)
 Persistent microscopic hematuria. 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 Isn't aware of any family history of this. Creatinine within normal limits but I do note slow upward trend since 2009. Recent CT abdomen for abdominal pain with subcentimeter renal hypodensities too small for characterization and no stones or hydronephrosis. I doubt a repeat CT would yield new information. I'll ask urology to see her in consult.  Also talked about dietitian support for weight loss alongside Zepbound .  Referral Orders         Ambulatory referral to Urology         Referral to Nutrition and Diabetes Services     Ozell Kung MD 09/24/2023, 4:31 PM

## 2023-09-26 ENCOUNTER — Other Ambulatory Visit (HOSPITAL_COMMUNITY): Payer: Self-pay

## 2023-09-26 ENCOUNTER — Other Ambulatory Visit: Payer: Self-pay | Admitting: Student

## 2023-09-26 DIAGNOSIS — R112 Nausea with vomiting, unspecified: Secondary | ICD-10-CM

## 2023-09-26 MED ORDER — ONDANSETRON 4 MG PO TBDP
4.0000 mg | ORAL_TABLET | Freq: Three times a day (TID) | ORAL | 0 refills | Status: AC | PRN
  Filled 2023-09-26: qty 30, 10d supply, fill #0

## 2023-09-26 NOTE — Progress Notes (Signed)
 Having nausea and vomiting with low-dose Zepbound .  Stopping this medication indefinitely.  Start Zofran  as needed.  Meds ordered this encounter  Medications   ondansetron  (ZOFRAN -ODT) 4 MG disintegrating tablet    Sig: Take 1 tablet (4 mg total) by mouth every 8 (eight) hours as needed for nausea or vomiting.    Dispense:  30 tablet    Refill:  0   Medications Discontinued During This Encounter  Medication Reason   tirzepatide  (ZEPBOUND ) 2.5 MG/0.5ML Pen    Ozell Kung MD 09/26/2023, 10:05 AM

## 2023-10-07 ENCOUNTER — Other Ambulatory Visit (HOSPITAL_COMMUNITY): Payer: Self-pay

## 2023-10-22 ENCOUNTER — Encounter

## 2023-10-22 DIAGNOSIS — G43E09 Chronic migraine with aura, not intractable, without status migrainosus: Secondary | ICD-10-CM

## 2023-10-22 NOTE — Progress Notes (Signed)
 Attempted to call patient twice for scheduled video visit today. Left voicemail for patient to call office and reschedule appointment.   Penny Frisbie, DO Internal Medicine Resident, PGY-1 [name] 9:31 AM 10/22/2023

## 2023-10-22 NOTE — Patient Instructions (Signed)
 SABRA

## 2023-10-22 NOTE — Progress Notes (Deleted)
 Attempted to call patient twice for scheduled video visit today. Left voicemail for patient to call office and reschedule appointment.   Nur Krasinski, DO Internal Medicine Resident, PGY-1 [name] 8:58 AM 10/22/2023
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
## 2023-10-24 ENCOUNTER — Encounter (HOSPITAL_COMMUNITY): Admitting: Psychiatry

## 2023-10-24 ENCOUNTER — Encounter (HOSPITAL_COMMUNITY): Payer: Self-pay

## 2023-10-24 NOTE — Progress Notes (Signed)
 This encounter was created in error - please disregard.  Patient did not show up for the appointment.  Appointment reminders were sent to patient's phone and email.  Attempted to call the patient at 11:38 but received no response. Left a HIPAA compliant voicemail for patient to reschedule.

## 2023-11-13 ENCOUNTER — Ambulatory Visit: Payer: Self-pay

## 2023-11-13 ENCOUNTER — Other Ambulatory Visit (HOSPITAL_COMMUNITY): Payer: Self-pay

## 2023-11-13 MED ORDER — VALACYCLOVIR HCL 1 G PO TABS
1000.0000 mg | ORAL_TABLET | Freq: Two times a day (BID) | ORAL | 0 refills | Status: AC
  Filled 2023-11-13: qty 20, 10d supply, fill #0

## 2023-11-13 MED ORDER — METRONIDAZOLE 500 MG PO TABS
500.0000 mg | ORAL_TABLET | Freq: Three times a day (TID) | ORAL | 0 refills | Status: DC
  Filled 2023-11-13: qty 30, 10d supply, fill #0

## 2023-11-13 NOTE — Telephone Encounter (Signed)
 FYI Only or Action Required?: FYI only for provider.  Patient was last seen in primary care on 10/22/2023 by Amilibia, Jaden, DO.  Called Nurse Triage reporting Vaginal Pain.  Symptoms began several days ago.  Interventions attempted: OTC medications: Ibuprofen and Rest, hydration, or home remedies.  Symptoms are: gradually worsening.  Triage Disposition: See Physician Within 24 Hours  Patient/caregiver understands and will follow disposition?: No, refuses disposition- Advised UC or [facility], patient declined and prefers to be seen in office on Friday 9/26- appt wait listed to try to be seen sooner.   2-3days of vaginal pain- uncomfortable to sit down, pain 8/10, Pain on right side below the clitoris. Feels like a knot but also feels like it has been scratched. No itching, normal white discharge, no smell, not dry, slightly swollen. Denies fever. Endorses abnormal period: August 24th- Sept 7th and again Sept 11th and just finished 9/22. Heavier then normal flow.   No sexual activity in the last month. Ibuprofen- with minimal improvement  Copied from CRM #[id number]. Topic: Clinical - Red Word Triage >> Nov 13, 2023 11:34 AM Farrel B wrote: [facility] that prompted transfer to Nurse Triage: painful sensation in vaginal area when sitting down, just ended cycle that was on for 14 days and is actually the second one for this month Reason for Disposition  [1] MILD to MODERATE vaginal pain AND [2] present > 24 hours  (Exception: Chronic pain.)  Answer Assessment - Initial Assessment Questions 1. SYMPTOM: What's the main symptom you're concerned about? (e.g., pain, itching, dryness)     Pain, burning 2. LOCATION: Where is the pain located? (e.g., inside/outside, left/right)     R side just below her clitoris - looks red or irritated.  3. ONSET: When did the  pain  start?     2-3 days ago  4. PAIN: Is there any pain? If Yes, ask: How bad is it? (Scale: 1-10; mild, moderate, severe)      8/10 5. ITCHING: Is there any itching? If Yes, ask: How bad is it? (Scale: 1-10; mild, moderate, severe)     none 6. CAUSE: What do you think is causing the discharge? Have you had the same problem before? What happened then?     Unsure if related to tampon use but stopped tampons about 4 days ago and pain started 2-3 7. OTHER SYMPTOMS: Do you have any other symptoms? (e.g., fever, itching, vaginal bleeding, pain with urination, injury to genital area, vaginal foreign body)     Abnormal period much heavier, some pain when urinating 3-4/10 8. PREGNANCY: Is there any chance you are pregnant? When was your last menstrual period?     No chance pregnant  Protocols used: Vaginal Symptoms-A-AH

## 2023-11-13 NOTE — Telephone Encounter (Signed)
 RTC to patient states it hurts to sit.  Has to adjust herself for comfort.  Has been going on for 3 days.  No fever.  Pain is in vaginal area.  No bleeding at present.  Does not feel like cramping.  Refuses to go to the Urgent Care or ER.  Will call in the am to see if there is a cancellation.

## 2023-11-14 ENCOUNTER — Other Ambulatory Visit (HOSPITAL_COMMUNITY): Payer: Self-pay

## 2023-11-15 ENCOUNTER — Encounter: Admitting: Student

## 2023-11-27 ENCOUNTER — Ambulatory Visit: Admitting: Student

## 2023-12-02 ENCOUNTER — Ambulatory Visit: Admitting: Physician Assistant

## 2023-12-02 ENCOUNTER — Encounter: Payer: Self-pay | Admitting: Physician Assistant

## 2023-12-02 ENCOUNTER — Other Ambulatory Visit (HOSPITAL_COMMUNITY)
Admission: RE | Admit: 2023-12-02 | Discharge: 2023-12-02 | Disposition: A | Discharge status: Home / Self Care | Source: Ambulatory Visit | Attending: Physician Assistant | Admitting: Physician Assistant

## 2023-12-02 VITALS — BP 120/82 | HR 80 | Ht 68.0 in | Wt 312.4 lb

## 2023-12-02 DIAGNOSIS — A599 Trichomoniasis, unspecified: Secondary | ICD-10-CM

## 2023-12-02 DIAGNOSIS — Z124 Encounter for screening for malignant neoplasm of cervix: Secondary | ICD-10-CM

## 2023-12-02 DIAGNOSIS — Z113 Encounter for screening for infections with a predominantly sexual mode of transmission: Secondary | ICD-10-CM | POA: Diagnosis not present

## 2023-12-02 DIAGNOSIS — Z0000 Encounter for general adult medical examination without abnormal findings: Secondary | ICD-10-CM | POA: Diagnosis present

## 2023-12-02 DIAGNOSIS — L732 Hidradenitis suppurativa: Secondary | ICD-10-CM | POA: Diagnosis not present

## 2023-12-02 DIAGNOSIS — Z1331 Encounter for screening for depression: Secondary | ICD-10-CM

## 2023-12-02 DIAGNOSIS — B009 Herpesviral infection, unspecified: Secondary | ICD-10-CM

## 2023-12-02 NOTE — Progress Notes (Signed)
 Last PAP in 2019.   Nexplanon  was placed in left arm in 2019, pt would like this removed, if not today she is open to making another appt to come back for removal and reinsertion.   PHQ @ 9 and GAD @ 8; pt states she is already seeing a psychiatrist.   Pt receiving all STD/STI testing today.

## 2023-12-02 NOTE — Progress Notes (Signed)
 ANNUAL EXAM Patient name: Kristin Wright MRN [id number]  Date of birth: December 24, 1985 Chief Complaint:   No chief complaint on file.  History of Present Illness:   Kristin Wright is a 38 y.o. [phone]  female being seen today for a routine annual exam.   Current complaints: None  Patient's last menstrual period was 11/26/2023 (exact date).   The pregnancy intention screening data noted above was reviewed. Potential methods of contraception were discussed. The patient elected to proceed with No data recorded.   Last pap 07/03/2017. Results were: NILM w/ HRHPV negative. H/O abnormal pap: no Last mammogram: Never done due to age. Results were: N/A. Family h/o breast cancer: yes maternal grandmother Last colonoscopy: Never done due to age. Results were: N/A. Family h/o colorectal cancer: no STI screening:  Contraception: Nexplanon      12/02/2023    1:40 PM 09/20/2023    9:53 AM 09/02/2023    9:34 AM 06/04/2023    4:23 PM 04/23/2023   11:10 AM  Depression screen PHQ 2/9  Decreased Interest 0 1 2 1    Down, Depressed, Hopeless 1 1 2 1    PHQ - 2 Score 1 2 4 2    Altered sleeping 2 2 2 2    Tired, decreased energy 1 2 3 1    Change in appetite 2 2 2 1    Feeling bad or failure about yourself  1 1 1 1    Trouble concentrating 1 2 2 2    Moving slowly or fidgety/restless 1 0 2 0   Suicidal thoughts 0 0 0 1   PHQ-9 Score 9 11 16 10    Difficult doing work/chores  Somewhat difficult Somewhat difficult Somewhat difficult      Information is confidential and restricted. Go to Review Flowsheets to unlock data.        12/02/2023    1:48 PM 09/20/2023   10:42 AM 09/02/2023    9:37 AM 06/04/2023    4:24 PM  GAD 7 : Generalized Anxiety Score  Nervous, Anxious, on Edge 1 2 2 1   Control/stop worrying 1 2 2 1   Worry too much - different things 1 2 2 1   Trouble relaxing 1 2 3 1   Restless 1 1 2 2   Easily annoyed or irritable 2 2 3 1   Afraid - awful might happen 1 0 0 1  Total GAD 7 Score 8 11 14 8    Anxiety Difficulty  Somewhat difficult Somewhat difficult Somewhat difficult     Review of Systems:   Pertinent items are noted in HPI Denies any headaches, blurred vision, fatigue, shortness of breath, chest pain, abdominal pain, abnormal vaginal discharge/itching/odor/irritation, problems with periods, bowel movements, urination, or intercourse unless otherwise stated above. Pertinent History Reviewed:  Reviewed past medical,surgical, social and family history.  Reviewed problem list, medications and allergies. Physical Assessment:   Vitals:   12/02/23 1335  BP: 120/82  Pulse: 80  Weight: (!) 312 lb 6.4 oz (141.7 kg)  Height: 5' 8 (1.727 m)  Body mass index is 47.5 kg/m.        Physical Examination:   General appearance - large body habitus. Well appearing, and in no distress  Mental status - alert, oriented to person, place, and time  Psych:  She has a normal mood and affect  Skin - warm and dry, normal color, no suspicious lesions noted  Chest - effort normal, all lung fields clear to auscultation bilaterally  Heart - normal rate and regular rhythm  Neck:  midline trachea, no thyromegaly or nodules  Breasts - +resolved scarring on lateral left breast at 3-4 o'clock. Breasts otherwise appear normal, no suspicious masses, no nipple changes or axillary nodes  Abdomen - soft, nontender, nondistended, no masses or organomegaly  Pelvic - VULVA: normal appearing vulva with no masses, tenderness or lesions  VAGINA: normal appearing vagina with normal color and discharge, no lesions  CERVIX: normal appearing cervix without discharge or lesions, no CMT  Thin prep pap is done with HR HPV cotesting  UTERUS: uterus is felt to be normal size, shape, consistency and nontender   ADNEXA: No adnexal masses or tenderness noted.  Extremities:  No swelling or varicosities noted  Chaperone present for exam  No results found for this or any previous visit (from the past 24 hours).  Assessment  & Plan:  1. Annual physical exam (Primary) - Cervical cancer screening: Discussed guidelines. Pap with HPV done - GC/CT: accepts - Birth Control: Nexplanon , will schedule visit for removal. - Breast Health: Encouraged self breast awareness/SBE. Teaching provided.  - Mammogram: @ 38yo, or sooner if problems - Colonoscopy: @ 38yo, or sooner if problems - F/U 12 months and prn   2. Cervical cancer screening - Cytology - PAP( [facility])  3. Screen for STD (sexually transmitted disease) - Cervicovaginal ancillary only( [place]) - Hepatitis B Surface AntiGEN - Hepatitis C Antibody - HIV antibody (with reflex) - RPR  4. Hidradenitis suppurativa Resolved lesions on lateral left breast and bilateral inner thighs with history of drainage from these. Will refer to dermatology. Patient education included in AVS.  - Ambulatory referral to Dermatology  5. Trichomonas infection Patient completed treatment two days ago. Advised she may come for nursing visit in 3-4 weeks for TOC. Advised avoiding all forms of sex until one week after both partners have completed treatment to prevent re-infection.   6. Herpes simplex infection Culture confirmed. First and only outbreak last month when she was ill.  May need suppression if recurrent.  7. Positive depression screening Patient reports no SI/HI. Currently seeing therapist for this.   Orders Placed This Encounter  Procedures   Hepatitis B Surface AntiGEN   Hepatitis C Antibody   HIV antibody (with reflex)   RPR   Ambulatory referral to Dermatology    Meds: No orders of the defined types were placed in this encounter.   Follow-up: No follow-ups on file.  Rynell Ciotti E Draya Felker, [place] 12/02/2023 5:12 PM

## 2023-12-03 ENCOUNTER — Ambulatory Visit: Admitting: Student

## 2023-12-03 LAB — CERVICOVAGINAL ANCILLARY ONLY
Bacterial Vaginitis (gardnerella): NEGATIVE
Candida Glabrata: NEGATIVE
Candida Vaginitis: NEGATIVE
Chlamydia: NEGATIVE
Comment: NEGATIVE
Comment: NEGATIVE
Comment: NEGATIVE
Comment: NEGATIVE
Comment: NEGATIVE
Comment: NORMAL
Neisseria Gonorrhea: NEGATIVE
Trichomonas: NEGATIVE

## 2023-12-03 LAB — HEPATITIS B SURFACE ANTIGEN: Hepatitis B Surface Ag: NEGATIVE

## 2023-12-03 LAB — RPR: RPR Ser Ql: NONREACTIVE

## 2023-12-03 LAB — HIV ANTIBODY (ROUTINE TESTING W REFLEX): HIV Screen 4th Generation wRfx: NONREACTIVE

## 2023-12-03 LAB — HEPATITIS C ANTIBODY: Hep C Virus Ab: NONREACTIVE

## 2023-12-03 NOTE — Progress Notes (Deleted)
 Intractable chronic migraine with aura and without status migrainosus Assessment & Plan: Continues to have her typical migraines daily. Review of her chart shows very similar symptoms in terms of laterality and vision changes including black spots in 2023. She has had MRI and EEG, both normal. She has tried Imitrex , propranolol , and Topamax. She may do well with CGRP inhibitor, recommend she go back to neurology to discuss. In the meantime I'll try a low dose of amitriptyline .    Orders: -     Amitriptyline  HCl; Take 1 tablet (25 mg total) by mouth at bedtime.  Dispense: 90 tablet; Refill: 0   Hematuria, unspecified type Assessment & Plan: Incidental finding during last hospitalization. Not menstruating. No frank hematuria. Asymptomatic.   Orders: -     Urinalysis, Routine w reflex microscopic   Morbid obesity with BMI of 50.0-59.9, adult [facility]) Assessment & Plan: Body mass index is 46.71 kg/m.   I recommend eliminating sugary beverages like soda, sweet tea, and juice entirely. I recommended more vegetables, lean protein, and legumes. Frozen vegetables are healthy and inexpensive. Beans are a healthy and inexpensive source of lean protein and fiber. I recommend gradually increasing exercise. A daily walk is a great way to start an exercise program.   Referral: recommend referral to dietitian   Pharmacological intervention: restart Zepbound  and low dose     Orders: -     Zepbound ; Inject 2.5 mg into the skin once a week. Call for refill and to discuss increasing your dose.  Dispense: 2 mL; Refill: 0   OSA (obstructive sleep apnea) -     Zepbound ; Inject 2.5 mg into the skin once a week. Call for refill and to discuss increasing your dose.  Dispense: 2 mL; Refill: 0   Tobacco use Assessment & Plan: She's using nicotine  patches to help with quitting.     Intractable nausea and vomiting Assessment & Plan: Improving. Slowly restart GLP-1.    CC: Routine Follow Up for management  of chronic medical conditions after last office visit 09/20/2023  HPI:  Kristin Wright is a 38 y.o. female with pertinent PMH of migraines, OSA, cyclical vomiting syndrome, bipolar disorder, and hyperlipidemia who presents as above. Please see assessment and plan below for further details.  Medications: Current Outpatient Medications  Medication Instructions   amitriptyline  (ELAVIL ) 50 mg, Oral, Daily at bedtime   ARIPiprazole  (ABILIFY ) 5 mg, Oral, Daily   capsicum (ZOSTRIX) 0.075 % topical cream Topical, 3 times daily   cholecalciferol  (VITAMIN D3) 25 mcg, Oral, Daily   metroNIDAZOLE  (FLAGYL ) 500 MG tablet Take 1 tablet (500 mg total) by mouth 3 (three) times a day for 10 days.   nicotine  (NICODERM CQ  - DOSED IN MG/24 HOURS) 14 mg, Transdermal, Daily   ondansetron  (ZOFRAN -ODT) 4 MG disintegrating tablet Dissolve 1 tablet (4 mg total) by mouth every 8 (eight) hours as needed for nausea or vomiting.   OXcarbazepine  (TRILEPTAL ) 150 mg, Oral, 2 times daily   pantoprazole  (PROTONIX ) 40 mg, Oral, Daily   scopolamine  (TRANSDERM-SCOP) 1.5 mg, Transdermal, every 72 hours   sucralfate  (CARAFATE ) 1 g, Oral, 4 times daily   valACYclovir  (VALTREX ) 1000 MG tablet Take 1 tablet (1,000 mg total) by mouth 2 (two) times a day for 10 days.   Vitamin D  (Ergocalciferol ) (DRISDOL ) 50,000 Units, Oral, Every 7 days, TAKE IT ONLY ONCE A WEEK     Review of Systems:   Pertinent items noted in HPI and/or A&P.  Physical Exam:  There were no vitals  filed for this visit.  Constitutional:***. In no acute distress. HEENT: Normocephalic, atraumatic, Sclera non-icteric, PERRL, EOM intact Cardio:Regular rate and rhythm. 2+ bilateral {PulseLoc:28294} pulses. Pulm:Clear to auscultation bilaterally. Normal work of breathing on room air. Abdomen: Soft, non-tender, non-distended, positive bowel sounds. FDX:Wzhjupcz for extremity edema. Skin:Warm and dry. Neuro:Alert and oriented x3. No focal deficit  noted. Psych:Pleasant mood and affect.   Assessment & Plan:   Assessment & Plan   No orders of the defined types were placed in this encounter.    No follow-ups on file.   Patient {GC/GE:3044014::discussed with,seen with} {JGIMTSattending2025/2026:32954}  Fairy Pool, DO [facility] Internal [facility] Resident PGY-3 Clinic Phone: [phone] Please contact the on call pager at [phone] for any urgent or emergent needs.

## 2023-12-04 LAB — CYTOLOGY - PAP
Adequacy: ABSENT
Comment: NEGATIVE
Diagnosis: NEGATIVE
High risk HPV: NEGATIVE

## 2023-12-07 ENCOUNTER — Ambulatory Visit: Payer: Self-pay | Admitting: Physician Assistant

## 2023-12-11 ENCOUNTER — Ambulatory Visit: Admitting: Obstetrics and Gynecology

## 2023-12-18 ENCOUNTER — Ambulatory Visit: Admitting: Dietician

## 2023-12-30 ENCOUNTER — Telehealth (HOSPITAL_COMMUNITY): Payer: Self-pay

## 2023-12-30 ENCOUNTER — Ambulatory Visit (INDEPENDENT_AMBULATORY_CARE_PROVIDER_SITE_OTHER)

## 2023-12-30 ENCOUNTER — Other Ambulatory Visit: Payer: Self-pay

## 2023-12-30 ENCOUNTER — Telehealth (HOSPITAL_COMMUNITY): Payer: Self-pay | Admitting: Pharmacist

## 2023-12-30 ENCOUNTER — Ambulatory Visit: Payer: Self-pay

## 2023-12-30 ENCOUNTER — Other Ambulatory Visit (HOSPITAL_COMMUNITY): Payer: Self-pay

## 2023-12-30 VITALS — BP 123/69 | HR 89 | Temp 98.6°F | Ht 68.0 in | Wt 312.6 lb

## 2023-12-30 DIAGNOSIS — M25561 Pain in right knee: Secondary | ICD-10-CM | POA: Insufficient documentation

## 2023-12-30 DIAGNOSIS — M5440 Lumbago with sciatica, unspecified side: Secondary | ICD-10-CM

## 2023-12-30 DIAGNOSIS — J029 Acute pharyngitis, unspecified: Secondary | ICD-10-CM | POA: Diagnosis not present

## 2023-12-30 DIAGNOSIS — M549 Dorsalgia, unspecified: Secondary | ICD-10-CM | POA: Insufficient documentation

## 2023-12-30 MED ORDER — LIDOCAINE 5 % EX PTCH
1.0000 | MEDICATED_PATCH | CUTANEOUS | 0 refills | Status: AC
  Filled 2023-12-30 (×2): qty 10, 10d supply, fill #0

## 2023-12-30 NOTE — Telephone Encounter (Signed)
 PA request has been Received. New Encounter has been or will be created for follow up. For additional info see Pharmacy Prior Auth telephone encounter from 12/30/23.

## 2023-12-30 NOTE — Telephone Encounter (Signed)
 Pt is currently here at the office for an appt.

## 2023-12-30 NOTE — Telephone Encounter (Signed)
 Pharmacy Patient Advocate Encounter   Received notification from Pt Calls Messages that prior authorization for Lidocaine  5% patches  is required/requested.   Insurance verification completed.   The patient is insured through [facility].   Per test claim: PA required; PA submitted to above mentioned insurance via Latent Key/confirmation #/EOC [id number] Status is pending

## 2023-12-30 NOTE — Assessment & Plan Note (Addendum)
 Acute, within the last night. Patient believes she may have slept wrong on couch over the weekend. She has tried taking Ibuprofen 500mg  x 3 tablets on Sunday, which helped alleviate some of the pain, but still notes a sharp pain in her back that is limiting her movement. TTP on physical exam to bilateral mid-to-lower back. Denies dysuria, hematuria, or urgency. Given acute nature and tenderness, likely MSK in nature. Discussed symptomatic treatment, including incorporating Tylenol  500mg  q6hr in rotation with NSAIDs, lidocaine  patches, topical analgesics like IcyHot/Bengay/Salonpas, as well as rest and heating pads. Also discussed being cautious with chronic use of NSAIDs, but can use these pills the acute setting. Patient is amenable to these interventions, and will send lidocaine  patches to pharmacy. If symptoms do not improve in 2 weeks, then patient to follow up for further workup.   -Lidocaine  patches 5% -Rest, heating pads, topical pain relievers, and Tylenol .

## 2023-12-30 NOTE — Progress Notes (Signed)
 Acute Office Visit  Subjective:     Patient ID: Kristin Wright, female    DOB: 1985-10-03, 38 y.o.   MRN: [id number]  Chief Complaint  Patient presents with   Acute Visit    Sick visit .SABRA... Back and knee pain  along with  sore throat per E2C2   SEVERE back pain (e.g., excruciating, unable to do any normal activities) AND [2] not improved 2 hours after pain medicine started  yesterday     Kristin Wright is a 38 year old female with a past medical history of migraines, OSA, tobacco use disorder, bipolar disorder, depression and anxiety, and morbid obesity who presents to clinic today for an acute visit regarding her sore throat, knee and back pain. Please see problem-based assessment and plan below for details.     Review of Systems  Constitutional: Negative.  Negative for chills, fever and malaise/fatigue.  HENT:  Positive for congestion and sore throat.   Eyes:        Eyes watery  Respiratory: Negative.    Cardiovascular: Negative.   Musculoskeletal:  Positive for back pain and joint pain.  Skin: Negative.   Neurological: Negative.   Endo/Heme/Allergies: Negative.   Psychiatric/Behavioral: Negative.          Objective:    BP 123/69 (BP Location: Right Arm, Patient Position: Sitting, Cuff Size: Large)   Pulse 89   Temp 98.6 F (37 C) (Oral)   Ht 5' 8 (1.727 m)   Wt (!) 312 lb 9.6 oz (141.8 kg)   LMP 11/26/2023 (Exact Date)   SpO2 98%   BMI 47.53 kg/m  Physical Exam Constitutional:      Appearance: Normal appearance.  HENT:     Head:     Comments: No noted cervical LAD.    Nose: Nose normal.     Mouth/Throat:     Mouth: Mucous membranes are dry.     Pharynx: Oropharynx is clear. No oropharyngeal exudate or posterior oropharyngeal erythema.  Eyes:     Pupils: Pupils are equal, round, and reactive to light.  Cardiovascular:     Rate and Rhythm: Normal rate and regular rhythm.     Pulses: Normal pulses.     Heart sounds: Normal heart sounds.  Pulmonary:      Effort: Pulmonary effort is normal.     Breath sounds: Normal breath sounds.  Abdominal:     General: Abdomen is flat.     Palpations: Abdomen is soft.  Musculoskeletal:        General: Tenderness present. Normal range of motion.     Cervical back: Normal range of motion.     Comments: Tenderness to palpation of bilateral lower back to midback. ROM is normal but slow. R knee without warmth, swelling or decreased ROM, but is tender to palpation.   Skin:    General: Skin is warm and dry.  Neurological:     General: No focal deficit present.     Mental Status: She is alert and oriented to person, place, and time.  Psychiatric:        Mood and Affect: Mood normal.        Behavior: Behavior normal.        Assessment & Plan:   Patient seen with Dr. Ronnald Sergeant.  Problem List Items Addressed This Visit       Other   Back pain   Acute, within the last night. Patient believes she may have slept wrong on couch over the weekend.  She has tried taking Ibuprofen 500mg  x 3 tablets on Sunday, which helped alleviate some of the pain, but still notes a sharp pain in her back that is limiting her movement. TTP on physical exam to bilateral mid-to-lower back. Denies dysuria, hematuria, or urgency. Given acute nature and tenderness, likely MSK in nature. Discussed symptomatic treatment, including incorporating Tylenol  500mg  q6hr in rotation with NSAIDs, lidocaine  patches, topical analgesics like IcyHot/Bengay/Salonpas, as well as rest and heating pads. Also discussed being cautious with chronic use of NSAIDs, but can use these pills the acute setting. Patient is amenable to these interventions, and will send lidocaine  patches to pharmacy. If symptoms do not improve in 2 weeks, then patient to follow up for further workup.   -Lidocaine  patches 5% -Rest, heating pads, topical pain relievers, and Tylenol .       Relevant Medications   lidocaine  (LIDODERM ) 5 %   Sore throat - Primary   Patient  developed sore throat on Friday afternoon. 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 She has some tickling at the back of her throat, watery eyes, sinus pressure, and congestion, but denies fever, chills, SOB, appetite loss, or fatigue. No cervical LAD on exam and no erythema of back of throat. Less suspicious for strep throat given patient's presentation and lack of symptoms. She took 2 at-home COVID tests over the weekend that were negative, however her job is requiring official documentation of COVID testing for her to have an excused absence from work. Will swab today and provide work note. Informed patient if her symptoms worsen to call the office and be seen again. Patient voiced understanding.   -COVID, RSV, and flu swab today -Will call patient with results, and will adjust work note if needed if viral panel is positive      Relevant Orders   COVID-19, Flu A+B and RSV   Pain in right knee   Ongoing, believes this to be secondary to her acute back pain and her compensating when walking for the pain she is having. She is tender to palpation of the knee and shin, however there is no warmth or effusion on physical exam. She used to follow with orthopedics history for steroid injections of her left knee, but these were only helpful for a few days at best. Discussed that she can use similar symptomatic management for her back on her knee. If symptoms do not improve in 2 weeks, then patient is to follow up for further management. She previously had been on Zepbound , but was stopped due to gastrointestinal side effects. She may be a candidate for other GLP1s to help with weight loss and further help her knee pain.        Meds ordered this encounter  Medications   lidocaine  (LIDODERM ) 5 %    Sig: Place 1 patch onto the skin daily for 10 days. Remove & Discard patch within 12 hours or as directed by MD     Dispense:  10 patch    Refill:  0    Return in about 2 weeks (around 01/13/2024) for symptom check.   Sweden Lesure, DO Internal Medicine Resident, PGY-1 2:17 PM 12/30/2023

## 2023-12-30 NOTE — Telephone Encounter (Signed)
 FYI Only or Action Required?: FYI only for provider: appointment scheduled on 12/30/23.  Patient was last seen in primary care on 10/22/2023 by Amilibia, Jaden, DO.  Called Nurse Triage reporting Knee Pain and Back Pain.  Symptoms began several days ago.  Interventions attempted: OTC medications: ibuprofen.  Symptoms are: gradually worsening.  Triage Disposition: See HCP Within 4 Hours (Or PCP Triage)  Patient/caregiver understands and will follow disposition?: Yes         Copied from CRM [id number]. Topic: Clinical - Red Word Triage >> Dec 30, 2023  9:21 AM Laurier BROCKS wrote: Red Word that prompted transfer to Nurse Triage: Patient states she is having right knee pain and sharp pains in her back she would rate pain at an 8 Reason for Disposition  [1] SEVERE back pain (e.g., excruciating, unable to do any normal activities) AND [2] not improved 2 hours after pain medicine  Answer Assessment - Initial Assessment Questions 1. ONSET: When did the pain begin? (e.g., minutes, hours, days)     yesterday 2. LOCATION: Where does it hurt? (upper, mid or lower back)     *No Answer* 3. SEVERITY: How bad is the pain?  (e.g., Scale 1-10; mild, moderate, or severe)     Shooting pain 8/10 4. PATTERN: Is the pain constant? (e.g., yes, no; constant, intermittent)      Comes and goes 5. RADIATION: Does the pain shoot into your legs or somewhere else?     denies 6. CAUSE:  What do you think is causing the back pain?      Fell asleep on couch, so unknown r/t the way slept 7. BACK OVERUSE:  Any recent lifting of heavy objects, strenuous work or exercise?     Does endorse bumping knee on file cabinet 8. MEDICINES: What have you taken so far for the pain? (e.g., nothing, acetaminophen , NSAIDS)     ibuprofen 9. NEUROLOGIC SYMPTOMS: Do you have any weakness, numbness, or problems with bowel/bladder control?     denies 10. OTHER SYMPTOMS: Do you have any other symptoms? (e.g.,  fever, abdomen pain, burning with urination, blood in urine)       [place], sore throat, knee pain, tickle in throat 11. PREGNANCY: Is there any chance you are pregnant? When was your last menstrual period?       N/a  Protocols used: Back Pain-A-AH

## 2023-12-30 NOTE — Assessment & Plan Note (Signed)
 Ongoing, believes this to be secondary to her acute back pain and her compensating when walking for the pain she is having. She is tender to palpation of the knee and shin, however there is no warmth or effusion on physical exam. She used to follow with orthopedics history for steroid injections of her left knee, but these were only helpful for a few days at best. Discussed that she can use similar symptomatic management for her back on her knee. If symptoms do not improve in 2 weeks, then patient is to follow up for further management. She previously had been on Zepbound , but was stopped due to gastrointestinal side effects. She may be a candidate for other GLP1s to help with weight loss and further help her knee pain.

## 2023-12-30 NOTE — Assessment & Plan Note (Signed)
 Patient developed sore throat on Friday afternoon. 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 She has some tickling at the back of her throat, watery eyes, sinus pressure, and congestion, but denies fever, chills, SOB, appetite loss, or fatigue. No cervical LAD on exam and no erythema of back of throat. Less suspicious for strep throat given patient's presentation and lack of symptoms. She took 2 at-home COVID tests over the weekend that were negative, however her job is requiring official documentation of COVID testing for her to have an excused absence from work. Will swab today and provide work note. Informed patient if her symptoms worsen to call the office and be seen again. Patient voiced understanding.   -COVID, RSV, and flu swab today -Will call patient with results, and will adjust work note if needed if viral panel is positive

## 2023-12-30 NOTE — Telephone Encounter (Signed)
 Pharmacy Patient Advocate Encounter  Received notification from [facility] that Prior Authorization for Lidocaine  5% patches  has been DENIED.  See denial reason below. No denial letter attached in CMM. Will attach denial letter to Media tab once received.   PA #/Case ID/Reference #: [id number]

## 2023-12-30 NOTE — Patient Instructions (Addendum)
 Thank you, [name] for allowing us  to provide your care today. Today we discussed your new knee, back pain, and sore throat.    New medications: -Lidocaine  patches 5%  I have ordered the following labs for you:  Lab Orders         COVID-19, Flu A+B and RSV       I will call if any are abnormal. All of your labs can be accessed through My Chart.   My Chart Access: [email or web address]  Please follow-up in: 2 weeks if your symptoms do not improve    We look forward to seeing you next time. Please call our clinic at [phone] if you have any questions or concerns. The best time to call is Monday-Friday from 9am-4pm, but there is someone available 24/7. If after hours or the weekend, call the main hospital number and ask for the Internal Medicine Resident On-Call. If you need medication refills, please notify your pharmacy one week in advance and they will send us  a request.   Thank you for letting us  take part in your care. Wishing you the best!  Rajiv Parlato, DO 12/30/2023, 1:48 PM [name]

## 2023-12-31 ENCOUNTER — Other Ambulatory Visit (HOSPITAL_COMMUNITY): Payer: Self-pay

## 2024-01-01 ENCOUNTER — Ambulatory Visit: Payer: Self-pay

## 2024-01-01 LAB — COVID-19, FLU A+B AND RSV
Influenza A, NAA: NOT DETECTED
Influenza B, NAA: NOT DETECTED
RSV, NAA: NOT DETECTED
SARS-CoV-2, NAA: NOT DETECTED

## 2024-01-01 NOTE — Progress Notes (Signed)
 COVID, RSV, and flu negative. Informed patient.

## 2024-01-02 ENCOUNTER — Encounter: Payer: Self-pay | Admitting: Neurology

## 2024-01-02 NOTE — Progress Notes (Signed)
 Internal Medicine Clinic Attending  I was physically present during the key portions of the resident provided service and participated in the medical decision making of patient's management care. I reviewed pertinent patient test results.  The assessment, diagnosis, and plan were formulated together and I agree with the documentation in the resident's note.  Dickie La, MD

## 2024-03-27 ENCOUNTER — Other Ambulatory Visit (HOSPITAL_COMMUNITY): Payer: Self-pay

## 2024-03-27 ENCOUNTER — Encounter: Payer: Self-pay | Admitting: Neurology

## 2024-03-27 ENCOUNTER — Other Ambulatory Visit

## 2024-03-27 ENCOUNTER — Ambulatory Visit: Admitting: Neurology

## 2024-03-27 VITALS — BP 120/68 | HR 88 | Ht 67.0 in | Wt 315.0 lb

## 2024-03-27 DIAGNOSIS — R413 Other amnesia: Secondary | ICD-10-CM

## 2024-03-27 DIAGNOSIS — G43E19 Chronic migraine with aura, intractable, without status migrainosus: Secondary | ICD-10-CM

## 2024-03-27 LAB — TSH: TSH: 2.52 m[IU]/L

## 2024-03-27 LAB — VITAMIN B12: Vitamin B-12: 176 pg/mL — ABNORMAL LOW (ref 200–1100)

## 2024-03-27 MED ORDER — AMITRIPTYLINE HCL 25 MG PO TABS
ORAL_TABLET | ORAL | 6 refills | Status: AC
  Filled 2024-03-27: qty 60, 30d supply, fill #0

## 2024-03-27 NOTE — Patient Instructions (Signed)
 Good to see you.  Restart Amitriptyline  25mg : take 1 tablet every night for 1 week, then increase to 2 tablets every night. Start using a pillbox and an alarm to help with medication reminders  2. Have bloodwork done for TSH, B12  3. Please reconnect with your sleep specialist to discuss other options for sleep apnea, also with your psychiatrist to help with mood changes. All of these can affect our memory  4. Follow-up in 3 months, call for any changes
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
## 2024-04-20 ENCOUNTER — Ambulatory Visit: Admitting: Family Medicine

## 2024-04-28 ENCOUNTER — Ambulatory Visit: Admitting: Neurology

## 2024-07-03 ENCOUNTER — Ambulatory Visit: Payer: Self-pay | Admitting: Neurology

## 2024-07-29 ENCOUNTER — Ambulatory Visit: Admitting: Dermatology
